# Patient Record
Sex: Female | Born: 1935 | Race: Black or African American | Hispanic: No | Smoking: Never smoker
Health system: Southern US, Community
[De-identification: ages and names within clinical notes are randomized; demographics above are authoritative.]

## PROBLEM LIST (undated history)

## (undated) DIAGNOSIS — M79606 Pain in leg, unspecified: Secondary | ICD-10-CM

## (undated) DIAGNOSIS — F29 Unspecified psychosis not due to a substance or known physiological condition: Secondary | ICD-10-CM

## (undated) DIAGNOSIS — Z789 Other specified health status: Secondary | ICD-10-CM

## (undated) DIAGNOSIS — D696 Thrombocytopenia, unspecified: Secondary | ICD-10-CM

## (undated) DIAGNOSIS — F329 Major depressive disorder, single episode, unspecified: Secondary | ICD-10-CM

## (undated) DIAGNOSIS — D649 Anemia, unspecified: Secondary | ICD-10-CM

## (undated) DIAGNOSIS — G8929 Other chronic pain: Secondary | ICD-10-CM

## (undated) DIAGNOSIS — F0390 Unspecified dementia without behavioral disturbance: Secondary | ICD-10-CM

## (undated) DIAGNOSIS — I10 Essential (primary) hypertension: Secondary | ICD-10-CM

## (undated) DIAGNOSIS — M1990 Unspecified osteoarthritis, unspecified site: Secondary | ICD-10-CM

## (undated) DIAGNOSIS — R1312 Dysphagia, oropharyngeal phase: Secondary | ICD-10-CM

## (undated) DIAGNOSIS — M8580 Other specified disorders of bone density and structure, unspecified site: Secondary | ICD-10-CM

## (undated) DIAGNOSIS — C9000 Multiple myeloma not having achieved remission: Secondary | ICD-10-CM

## (undated) DIAGNOSIS — E7800 Pure hypercholesterolemia, unspecified: Secondary | ICD-10-CM

## (undated) DIAGNOSIS — R778 Other specified abnormalities of plasma proteins: Secondary | ICD-10-CM

## (undated) HISTORY — DX: Other specified disorders of bone density and structure, unspecified site: M85.80

## (undated) HISTORY — PX: TUBAL LIGATION: SHX77

## (undated) HISTORY — PX: FOOT SURGERY: SHX648

## (undated) HISTORY — DX: Multiple myeloma not having achieved remission: C90.00

## (undated) HISTORY — DX: Unspecified osteoarthritis, unspecified site: M19.90

---

## 2000-01-19 ENCOUNTER — Encounter: Admission: RE | Admit: 2000-01-19 | Discharge: 2000-01-19 | Payer: Self-pay | Admitting: Family Medicine

## 2000-03-30 ENCOUNTER — Encounter: Admission: RE | Admit: 2000-03-30 | Discharge: 2000-03-30 | Payer: Self-pay | Admitting: *Deleted

## 2001-02-25 ENCOUNTER — Other Ambulatory Visit: Admission: RE | Admit: 2001-02-25 | Discharge: 2001-02-25 | Payer: Self-pay | Admitting: Family Medicine

## 2001-02-27 ENCOUNTER — Ambulatory Visit (HOSPITAL_COMMUNITY): Admission: RE | Admit: 2001-02-27 | Discharge: 2001-02-27 | Payer: Self-pay | Admitting: Family Medicine

## 2001-02-27 ENCOUNTER — Encounter: Payer: Self-pay | Admitting: Family Medicine

## 2001-03-16 ENCOUNTER — Encounter: Payer: Self-pay | Admitting: *Deleted

## 2001-03-16 ENCOUNTER — Emergency Department (HOSPITAL_COMMUNITY): Admission: EM | Admit: 2001-03-16 | Discharge: 2001-03-16 | Payer: Self-pay | Admitting: *Deleted

## 2001-06-29 ENCOUNTER — Emergency Department (HOSPITAL_COMMUNITY): Admission: EM | Admit: 2001-06-29 | Discharge: 2001-06-29 | Payer: Self-pay | Admitting: Emergency Medicine

## 2001-06-29 ENCOUNTER — Encounter: Payer: Self-pay | Admitting: Emergency Medicine

## 2001-09-01 ENCOUNTER — Encounter: Payer: Self-pay | Admitting: Emergency Medicine

## 2001-09-01 ENCOUNTER — Emergency Department (HOSPITAL_COMMUNITY): Admission: EM | Admit: 2001-09-01 | Discharge: 2001-09-01 | Payer: Self-pay | Admitting: Emergency Medicine

## 2002-05-05 ENCOUNTER — Encounter: Payer: Self-pay | Admitting: Family Medicine

## 2002-05-05 ENCOUNTER — Ambulatory Visit (HOSPITAL_COMMUNITY): Admission: RE | Admit: 2002-05-05 | Discharge: 2002-05-05 | Payer: Self-pay | Admitting: Family Medicine

## 2003-01-22 ENCOUNTER — Emergency Department (HOSPITAL_COMMUNITY): Admission: EM | Admit: 2003-01-22 | Discharge: 2003-01-22 | Payer: Self-pay | Admitting: *Deleted

## 2003-01-22 ENCOUNTER — Encounter: Payer: Self-pay | Admitting: *Deleted

## 2003-01-28 ENCOUNTER — Emergency Department (HOSPITAL_COMMUNITY): Admission: EM | Admit: 2003-01-28 | Discharge: 2003-01-28 | Payer: Self-pay | Admitting: Internal Medicine

## 2003-05-29 ENCOUNTER — Encounter: Payer: Self-pay | Admitting: Emergency Medicine

## 2003-05-29 ENCOUNTER — Emergency Department (HOSPITAL_COMMUNITY): Admission: EM | Admit: 2003-05-29 | Discharge: 2003-05-29 | Payer: Self-pay | Admitting: Emergency Medicine

## 2003-06-03 ENCOUNTER — Ambulatory Visit (HOSPITAL_COMMUNITY): Admission: RE | Admit: 2003-06-03 | Discharge: 2003-06-03 | Payer: Self-pay | Admitting: Family Medicine

## 2003-06-03 ENCOUNTER — Encounter: Payer: Self-pay | Admitting: Family Medicine

## 2003-09-01 ENCOUNTER — Ambulatory Visit (HOSPITAL_COMMUNITY): Admission: RE | Admit: 2003-09-01 | Discharge: 2003-09-01 | Payer: Self-pay | Admitting: Family Medicine

## 2003-09-01 ENCOUNTER — Encounter: Payer: Self-pay | Admitting: Family Medicine

## 2003-10-13 ENCOUNTER — Ambulatory Visit (HOSPITAL_COMMUNITY): Admission: RE | Admit: 2003-10-13 | Discharge: 2003-10-13 | Payer: Self-pay | Admitting: Internal Medicine

## 2004-03-23 ENCOUNTER — Emergency Department (HOSPITAL_COMMUNITY): Admission: EM | Admit: 2004-03-23 | Discharge: 2004-03-23 | Payer: Self-pay | Admitting: *Deleted

## 2005-01-25 ENCOUNTER — Emergency Department (HOSPITAL_COMMUNITY): Admission: EM | Admit: 2005-01-25 | Discharge: 2005-01-25 | Payer: Self-pay | Admitting: Emergency Medicine

## 2005-05-05 ENCOUNTER — Emergency Department (HOSPITAL_COMMUNITY): Admission: EM | Admit: 2005-05-05 | Discharge: 2005-05-05 | Payer: Self-pay | Admitting: Emergency Medicine

## 2005-12-11 ENCOUNTER — Ambulatory Visit (HOSPITAL_COMMUNITY): Admission: RE | Admit: 2005-12-11 | Discharge: 2005-12-11 | Payer: Self-pay | Admitting: Family Medicine

## 2006-04-09 ENCOUNTER — Emergency Department (HOSPITAL_COMMUNITY): Admission: EM | Admit: 2006-04-09 | Discharge: 2006-04-09 | Payer: Self-pay | Admitting: Emergency Medicine

## 2006-04-17 ENCOUNTER — Emergency Department (HOSPITAL_COMMUNITY): Admission: EM | Admit: 2006-04-17 | Discharge: 2006-04-17 | Payer: Self-pay | Admitting: Emergency Medicine

## 2006-04-26 ENCOUNTER — Ambulatory Visit (HOSPITAL_COMMUNITY): Admission: RE | Admit: 2006-04-26 | Discharge: 2006-04-26 | Payer: Self-pay | Admitting: Family Medicine

## 2006-06-12 ENCOUNTER — Ambulatory Visit (HOSPITAL_COMMUNITY): Admission: RE | Admit: 2006-06-12 | Discharge: 2006-06-12 | Payer: Self-pay | Admitting: Family Medicine

## 2006-07-14 ENCOUNTER — Inpatient Hospital Stay (HOSPITAL_COMMUNITY): Admission: EM | Admit: 2006-07-14 | Discharge: 2006-07-16 | Payer: Self-pay | Admitting: Emergency Medicine

## 2006-07-20 ENCOUNTER — Ambulatory Visit (HOSPITAL_COMMUNITY): Admission: RE | Admit: 2006-07-20 | Discharge: 2006-07-20 | Payer: Self-pay | Admitting: Family Medicine

## 2007-02-24 ENCOUNTER — Emergency Department (HOSPITAL_COMMUNITY): Admission: EM | Admit: 2007-02-24 | Discharge: 2007-02-24 | Payer: Self-pay | Admitting: Emergency Medicine

## 2007-03-16 ENCOUNTER — Emergency Department (HOSPITAL_COMMUNITY): Admission: EM | Admit: 2007-03-16 | Discharge: 2007-03-16 | Payer: Self-pay | Admitting: Emergency Medicine

## 2007-04-16 ENCOUNTER — Emergency Department (HOSPITAL_COMMUNITY): Admission: EM | Admit: 2007-04-16 | Discharge: 2007-04-16 | Payer: Self-pay | Admitting: Emergency Medicine

## 2007-06-06 ENCOUNTER — Emergency Department (HOSPITAL_COMMUNITY): Admission: EM | Admit: 2007-06-06 | Discharge: 2007-06-06 | Payer: Self-pay | Admitting: Emergency Medicine

## 2007-07-07 ENCOUNTER — Emergency Department (HOSPITAL_COMMUNITY): Admission: EM | Admit: 2007-07-07 | Discharge: 2007-07-07 | Payer: Self-pay | Admitting: Emergency Medicine

## 2007-07-09 ENCOUNTER — Ambulatory Visit (HOSPITAL_COMMUNITY): Admission: RE | Admit: 2007-07-09 | Discharge: 2007-07-09 | Payer: Self-pay | Admitting: Family Medicine

## 2007-07-25 ENCOUNTER — Emergency Department (HOSPITAL_COMMUNITY): Admission: EM | Admit: 2007-07-25 | Discharge: 2007-07-25 | Payer: Self-pay | Admitting: Emergency Medicine

## 2007-08-07 ENCOUNTER — Ambulatory Visit (HOSPITAL_COMMUNITY): Admission: RE | Admit: 2007-08-07 | Discharge: 2007-08-07 | Payer: Self-pay | Admitting: Pulmonary Disease

## 2007-08-17 ENCOUNTER — Emergency Department (HOSPITAL_COMMUNITY): Admission: EM | Admit: 2007-08-17 | Discharge: 2007-08-17 | Payer: Self-pay | Admitting: Emergency Medicine

## 2008-09-14 ENCOUNTER — Emergency Department (HOSPITAL_COMMUNITY): Admission: EM | Admit: 2008-09-14 | Discharge: 2008-09-14 | Payer: Self-pay | Admitting: Emergency Medicine

## 2008-09-23 ENCOUNTER — Emergency Department (HOSPITAL_COMMUNITY): Admission: EM | Admit: 2008-09-23 | Discharge: 2008-09-23 | Payer: Self-pay | Admitting: Emergency Medicine

## 2008-10-27 ENCOUNTER — Ambulatory Visit (HOSPITAL_COMMUNITY): Admission: RE | Admit: 2008-10-27 | Discharge: 2008-10-27 | Payer: Self-pay | Admitting: Family Medicine

## 2008-11-10 ENCOUNTER — Ambulatory Visit (HOSPITAL_COMMUNITY): Admission: RE | Admit: 2008-11-10 | Discharge: 2008-11-10 | Payer: Self-pay | Admitting: Family Medicine

## 2008-11-13 HISTORY — PX: COLONOSCOPY: SHX174

## 2009-01-13 ENCOUNTER — Telehealth (INDEPENDENT_AMBULATORY_CARE_PROVIDER_SITE_OTHER): Payer: Self-pay | Admitting: *Deleted

## 2009-01-30 ENCOUNTER — Emergency Department (HOSPITAL_COMMUNITY): Admission: EM | Admit: 2009-01-30 | Discharge: 2009-01-30 | Payer: Self-pay | Admitting: Emergency Medicine

## 2009-02-10 DIAGNOSIS — K648 Other hemorrhoids: Secondary | ICD-10-CM | POA: Insufficient documentation

## 2009-02-11 ENCOUNTER — Encounter: Payer: Self-pay | Admitting: Internal Medicine

## 2009-02-11 ENCOUNTER — Ambulatory Visit: Payer: Self-pay | Admitting: Gastroenterology

## 2009-02-11 DIAGNOSIS — Z860100 Personal history of colon polyps, unspecified: Secondary | ICD-10-CM | POA: Insufficient documentation

## 2009-02-11 DIAGNOSIS — S139XXA Sprain of joints and ligaments of unspecified parts of neck, initial encounter: Secondary | ICD-10-CM | POA: Insufficient documentation

## 2009-02-18 ENCOUNTER — Encounter: Payer: Self-pay | Admitting: Internal Medicine

## 2009-02-22 ENCOUNTER — Encounter: Payer: Self-pay | Admitting: Internal Medicine

## 2009-02-22 ENCOUNTER — Ambulatory Visit: Payer: Self-pay | Admitting: Internal Medicine

## 2009-02-22 ENCOUNTER — Ambulatory Visit (HOSPITAL_COMMUNITY): Admission: RE | Admit: 2009-02-22 | Discharge: 2009-02-22 | Payer: Self-pay | Admitting: Internal Medicine

## 2009-02-24 ENCOUNTER — Encounter: Payer: Self-pay | Admitting: Internal Medicine

## 2009-03-14 ENCOUNTER — Emergency Department (HOSPITAL_COMMUNITY): Admission: EM | Admit: 2009-03-14 | Discharge: 2009-03-14 | Payer: Self-pay | Admitting: Emergency Medicine

## 2009-04-10 ENCOUNTER — Emergency Department (HOSPITAL_COMMUNITY): Admission: EM | Admit: 2009-04-10 | Discharge: 2009-04-10 | Payer: Self-pay | Admitting: Emergency Medicine

## 2009-07-26 ENCOUNTER — Emergency Department (HOSPITAL_COMMUNITY): Admission: EM | Admit: 2009-07-26 | Discharge: 2009-07-27 | Payer: Self-pay | Admitting: Emergency Medicine

## 2009-07-30 ENCOUNTER — Ambulatory Visit (HOSPITAL_COMMUNITY): Admission: RE | Admit: 2009-07-30 | Discharge: 2009-07-30 | Payer: Self-pay | Admitting: Family Medicine

## 2009-08-04 ENCOUNTER — Emergency Department (HOSPITAL_COMMUNITY): Admission: EM | Admit: 2009-08-04 | Discharge: 2009-08-05 | Payer: Self-pay | Admitting: Emergency Medicine

## 2009-08-04 ENCOUNTER — Encounter (INDEPENDENT_AMBULATORY_CARE_PROVIDER_SITE_OTHER): Payer: Self-pay | Admitting: *Deleted

## 2009-08-25 ENCOUNTER — Ambulatory Visit: Payer: Self-pay | Admitting: Internal Medicine

## 2009-08-25 DIAGNOSIS — R1319 Other dysphagia: Secondary | ICD-10-CM

## 2009-08-25 DIAGNOSIS — K219 Gastro-esophageal reflux disease without esophagitis: Secondary | ICD-10-CM

## 2009-08-26 ENCOUNTER — Encounter (INDEPENDENT_AMBULATORY_CARE_PROVIDER_SITE_OTHER): Payer: Self-pay | Admitting: *Deleted

## 2009-08-26 ENCOUNTER — Encounter: Payer: Self-pay | Admitting: Internal Medicine

## 2009-09-03 ENCOUNTER — Emergency Department (HOSPITAL_COMMUNITY): Admission: EM | Admit: 2009-09-03 | Discharge: 2009-09-03 | Payer: Self-pay | Admitting: Emergency Medicine

## 2009-09-28 ENCOUNTER — Emergency Department (HOSPITAL_COMMUNITY): Admission: EM | Admit: 2009-09-28 | Discharge: 2009-09-28 | Payer: Self-pay | Admitting: Emergency Medicine

## 2009-11-02 ENCOUNTER — Encounter (HOSPITAL_COMMUNITY): Admission: RE | Admit: 2009-11-02 | Discharge: 2009-11-10 | Payer: Self-pay | Admitting: Family Medicine

## 2009-11-02 ENCOUNTER — Ambulatory Visit (HOSPITAL_COMMUNITY): Admission: RE | Admit: 2009-11-02 | Discharge: 2009-11-02 | Payer: Self-pay | Admitting: Family Medicine

## 2009-11-11 ENCOUNTER — Ambulatory Visit (HOSPITAL_COMMUNITY): Admission: RE | Admit: 2009-11-11 | Discharge: 2009-11-11 | Payer: Self-pay | Admitting: Family Medicine

## 2009-11-17 ENCOUNTER — Encounter (HOSPITAL_COMMUNITY): Admission: RE | Admit: 2009-11-17 | Discharge: 2009-12-17 | Payer: Self-pay | Admitting: Family Medicine

## 2009-12-20 ENCOUNTER — Encounter (HOSPITAL_COMMUNITY): Admission: RE | Admit: 2009-12-20 | Discharge: 2010-01-19 | Payer: Self-pay | Admitting: Family Medicine

## 2010-01-19 ENCOUNTER — Ambulatory Visit (HOSPITAL_COMMUNITY): Admission: RE | Admit: 2010-01-19 | Discharge: 2010-01-19 | Payer: Self-pay | Admitting: Family Medicine

## 2010-02-03 ENCOUNTER — Ambulatory Visit (HOSPITAL_COMMUNITY): Admission: RE | Admit: 2010-02-03 | Discharge: 2010-02-03 | Payer: Self-pay | Admitting: Family Medicine

## 2010-05-17 ENCOUNTER — Emergency Department (HOSPITAL_COMMUNITY): Admission: EM | Admit: 2010-05-17 | Discharge: 2010-05-17 | Payer: Self-pay | Admitting: Emergency Medicine

## 2010-06-19 ENCOUNTER — Emergency Department (HOSPITAL_COMMUNITY): Admission: EM | Admit: 2010-06-19 | Discharge: 2010-06-19 | Payer: Self-pay

## 2010-07-13 ENCOUNTER — Ambulatory Visit (HOSPITAL_COMMUNITY): Payer: Self-pay | Admitting: Psychology

## 2010-07-24 ENCOUNTER — Emergency Department (HOSPITAL_COMMUNITY): Admission: EM | Admit: 2010-07-24 | Discharge: 2010-07-24 | Payer: Self-pay | Admitting: Emergency Medicine

## 2010-07-27 ENCOUNTER — Ambulatory Visit (HOSPITAL_COMMUNITY): Payer: Self-pay | Admitting: Psychology

## 2010-08-03 ENCOUNTER — Ambulatory Visit (HOSPITAL_COMMUNITY): Payer: Self-pay | Admitting: Psychology

## 2010-08-04 ENCOUNTER — Ambulatory Visit (HOSPITAL_COMMUNITY): Payer: Self-pay | Admitting: Psychiatry

## 2010-08-11 ENCOUNTER — Ambulatory Visit (HOSPITAL_COMMUNITY): Payer: Self-pay | Admitting: Psychiatry

## 2010-08-16 ENCOUNTER — Emergency Department (HOSPITAL_COMMUNITY)
Admission: EM | Admit: 2010-08-16 | Discharge: 2010-08-16 | Payer: Self-pay | Source: Home / Self Care | Admitting: Emergency Medicine

## 2010-08-30 ENCOUNTER — Ambulatory Visit (HOSPITAL_COMMUNITY): Payer: Self-pay | Admitting: Psychiatry

## 2010-10-14 ENCOUNTER — Emergency Department (HOSPITAL_COMMUNITY)
Admission: EM | Admit: 2010-10-14 | Discharge: 2010-10-14 | Payer: Self-pay | Source: Home / Self Care | Admitting: Emergency Medicine

## 2010-11-10 ENCOUNTER — Ambulatory Visit (HOSPITAL_COMMUNITY): Payer: Self-pay | Admitting: Psychiatry

## 2010-11-15 ENCOUNTER — Ambulatory Visit (HOSPITAL_COMMUNITY): Admission: RE | Admit: 2010-11-15 | Payer: Self-pay | Source: Home / Self Care | Admitting: Family Medicine

## 2010-12-04 ENCOUNTER — Encounter: Payer: Self-pay | Admitting: Family Medicine

## 2010-12-07 ENCOUNTER — Ambulatory Visit (HOSPITAL_COMMUNITY)
Admission: RE | Admit: 2010-12-07 | Discharge: 2010-12-07 | Payer: Self-pay | Source: Home / Self Care | Attending: Family Medicine | Admitting: Family Medicine

## 2010-12-14 ENCOUNTER — Encounter: Payer: Self-pay | Admitting: Family Medicine

## 2011-01-10 ENCOUNTER — Encounter (INDEPENDENT_AMBULATORY_CARE_PROVIDER_SITE_OTHER): Payer: Medicare Other | Admitting: Psychiatry

## 2011-01-10 DIAGNOSIS — F29 Unspecified psychosis not due to a substance or known physiological condition: Secondary | ICD-10-CM

## 2011-01-24 ENCOUNTER — Encounter (INDEPENDENT_AMBULATORY_CARE_PROVIDER_SITE_OTHER): Payer: Medicare Other | Admitting: Psychiatry

## 2011-01-24 DIAGNOSIS — F29 Unspecified psychosis not due to a substance or known physiological condition: Secondary | ICD-10-CM

## 2011-01-26 LAB — BASIC METABOLIC PANEL
Chloride: 106 mEq/L (ref 96–112)
GFR calc non Af Amer: >60 mL/min (ref >60)
Glucose, Bld: 117 mg/dL — ABNORMAL HIGH (ref 70–99)
Potassium: 3.7 mEq/L (ref 3.5–5.1)
Sodium: 139 mEq/L (ref 135–145)

## 2011-01-26 LAB — URINALYSIS, ROUTINE W REFLEX MICROSCOPIC
Leukocytes, UA: NEGATIVE
Nitrite: NEGATIVE
Protein, ur: NEGATIVE mg/dL
Specific Gravity, Urine: >1.03 — ABNORMAL HIGH (ref 1.005–1.030)
Urobilinogen, UA: 1 mg/dL (ref 0.0–1.0)

## 2011-01-26 LAB — URINE CULTURE

## 2011-01-26 LAB — CBC
HCT: 37.3 % (ref 36.0–46.0)
Hemoglobin: 12.7 g/dL (ref 12.0–15.0)
MCHC: 34.1 g/dL (ref 30.0–36.0)
MCV: 95.8 fL (ref 78.0–100.0)
WBC: 6.7 10*3/uL (ref 4.0–10.5)

## 2011-01-26 LAB — URINE MICROSCOPIC-ADD ON

## 2011-01-26 LAB — DIFFERENTIAL
Basophils Relative: 0 % (ref 0–1)
Lymphocytes Relative: 26 % (ref 12–46)
Monocytes Relative: 7 % (ref 3–12)
Neutro Abs: 4.4 10*3/uL (ref 1.7–7.7)

## 2011-01-26 LAB — RAPID URINE DRUG SCREEN, HOSP PERFORMED
Amphetamines: NOT DETECTED
Barbiturates: NOT DETECTED
Benzodiazepines: NOT DETECTED
Tetrahydrocannabinol: NOT DETECTED

## 2011-01-27 LAB — CBC
HCT: 40.6 % (ref 36.0–46.0)
Hemoglobin: 13.6 g/dL (ref 12.0–15.0)
MCH: 31.9 pg (ref 26.0–34.0)
MCHC: 33.4 g/dL (ref 30.0–36.0)
MCV: 95.6 fL (ref 78.0–100.0)

## 2011-01-27 LAB — URINE MICROSCOPIC-ADD ON

## 2011-01-27 LAB — URINALYSIS, ROUTINE W REFLEX MICROSCOPIC
Nitrite: NEGATIVE
Specific Gravity, Urine: >1.03 — ABNORMAL HIGH (ref 1.005–1.030)
pH: 5.5 (ref 5.0–8.0)

## 2011-01-27 LAB — DIFFERENTIAL
Basophils Relative: 1 % (ref 0–1)
Eosinophils Absolute: 0.1 10*3/uL (ref 0.0–0.7)
Monocytes Absolute: 0.4 10*3/uL (ref 0.1–1.0)
Monocytes Relative: 6 % (ref 3–12)

## 2011-01-27 LAB — TSH: TSH: 0.863 u[IU]/mL (ref 0.350–4.500)

## 2011-01-27 LAB — BASIC METABOLIC PANEL
CO2: 27 mEq/L (ref 19–32)
Glucose, Bld: 106 mg/dL — ABNORMAL HIGH (ref 70–99)
Potassium: 3.1 mEq/L — ABNORMAL LOW (ref 3.5–5.1)
Sodium: 142 mEq/L (ref 135–145)

## 2011-01-27 LAB — URINE CULTURE: Culture  Setup Time: 201108090044

## 2011-02-07 ENCOUNTER — Other Ambulatory Visit (HOSPITAL_COMMUNITY): Payer: Self-pay | Admitting: Family Medicine

## 2011-02-07 ENCOUNTER — Ambulatory Visit (HOSPITAL_COMMUNITY)
Admission: RE | Admit: 2011-02-07 | Discharge: 2011-02-07 | Disposition: A | Discharge status: Home / Self Care | Payer: Medicare Other | Source: Ambulatory Visit | Attending: Family Medicine | Admitting: Family Medicine

## 2011-02-07 DIAGNOSIS — R0602 Shortness of breath: Secondary | ICD-10-CM | POA: Insufficient documentation

## 2011-02-17 LAB — CBC
HCT: 38.2 % (ref 36.0–46.0)
Hemoglobin: 13.5 g/dL (ref 12.0–15.0)
MCHC: 35.3 g/dL (ref 30.0–36.0)
MCV: 94.5 fL (ref 78.0–100.0)
RBC: 4.04 MIL/uL (ref 3.87–5.11)

## 2011-02-17 LAB — BASIC METABOLIC PANEL
CO2: 31 mEq/L (ref 19–32)
Chloride: 106 mEq/L (ref 96–112)
Creatinine, Ser: 0.75 mg/dL (ref 0.4–1.2)
GFR calc Af Amer: >60 mL/min (ref >60)
Potassium: 3.8 mEq/L (ref 3.5–5.1)

## 2011-02-17 LAB — DIFFERENTIAL
Basophils Relative: 1 % (ref 0–1)
Eosinophils Absolute: 0.2 10*3/uL (ref 0.0–0.7)
Eosinophils Relative: 4 % (ref 0–5)
Monocytes Absolute: 0.4 10*3/uL (ref 0.1–1.0)
Monocytes Relative: 8 % (ref 3–12)

## 2011-03-07 ENCOUNTER — Encounter (INDEPENDENT_AMBULATORY_CARE_PROVIDER_SITE_OTHER): Payer: Medicare Other | Admitting: Psychiatry

## 2011-03-07 DIAGNOSIS — F29 Unspecified psychosis not due to a substance or known physiological condition: Secondary | ICD-10-CM

## 2011-03-20 ENCOUNTER — Other Ambulatory Visit (HOSPITAL_COMMUNITY): Payer: Self-pay | Admitting: Family Medicine

## 2011-03-20 DIAGNOSIS — Z139 Encounter for screening, unspecified: Secondary | ICD-10-CM

## 2011-03-23 ENCOUNTER — Ambulatory Visit (HOSPITAL_COMMUNITY)
Admission: RE | Admit: 2011-03-23 | Discharge: 2011-03-23 | Disposition: A | Discharge status: Home / Self Care | Payer: Medicare Other | Source: Ambulatory Visit | Attending: Family Medicine | Admitting: Family Medicine

## 2011-03-23 DIAGNOSIS — Z1231 Encounter for screening mammogram for malignant neoplasm of breast: Secondary | ICD-10-CM | POA: Insufficient documentation

## 2011-03-23 DIAGNOSIS — Z139 Encounter for screening, unspecified: Secondary | ICD-10-CM

## 2011-03-28 NOTE — Op Note (Signed)
NAME:  [name], [name]               ACCOUNT NO.:  [id number]   MEDICAL RECORD NO.:  [id number]          PATIENT TYPE:  AMB   LOCATION:  DAY                           FACILITY:  [facility]   PHYSICIAN:  R. Roetta Sessions, M.D. DATE OF BIRTH:  February 03, 1930   DATE OF PROCEDURE:  02/22/2009  DATE OF DISCHARGE:                               OPERATIVE REPORT   INDICATIONS FOR PROCEDURE:  77 year old African American female with  history of colonic polyps.  She is here for surveillance.  She does not  have any GI symptoms at this time.  Risks, benefits and limitations have  been discussed, questions answered.  Please see documentation in the  medical record.   PROCEDURE NOTE:  O2 saturation, blood pressure, pulse and respiration  monitored the entire procedure.   CONSCIOUS SEDATION:  Versed 3 mg IV, Demerol 75 mg IV in divided doses.   INSTRUMENT USED:  Pentax video chip system.   FINDINGS:  Digital rectal exam revealed no abnormalities.   ENDOSCOPIC FINDINGS:  The prep was adequate.  Colon:  Colonic mucosa was surveyed from the rectosigmoid junction  through the left, transverse, right colon to area of appendiceal  orifice, ileocecal valve and cecum.  These structures were well and seen  photographed for the record.  From this level the scope was slowly and  cautiously withdrawn.  All previously mentioned mucosal surfaces were  again seen.  The patient was noted to have a 5-mm polyp in the base of  the cecum which was cold snared, removed.  There is a second similar  size polyp in the midsigmoid which was cold snared, removed.  Remainder  of colonic mucosa appeared normal.  Scope was pulled down to the rectum  where thorough examination of the rectal mucosa including retroflex view  of the anal verge demonstrated no abnormalities.  The patient tolerated  the procedure well, was reacted in endoscopy.  Cecal withdrawal time 9  minutes.   IMPRESSION:  1. Normal rectum.  2. Sigmoid and cecal  polyp, status post snare resection described      above.  Remainder of the colonic mucosa appeared normal.   RECOMMENDATIONS:  1. Follow-up on path.  2. Further recommendations to follow.      Jonathon Bellows, M.D.  Electronically Signed     RMR/MEDQ  D:  02/22/2009  T:  02/22/2009  Job:  [id number]

## 2011-03-31 NOTE — Op Note (Signed)
NAME:  [name], [name]                      ACCOUNT NO.:  [id number]   MEDICAL RECORD NO.:  [id number]                   PATIENT TYPE:  AMB   LOCATION:  DAY                                  FACILITY:  [facility]   PHYSICIAN:  R. Roetta Sessions, M.D.              DATE OF BIRTH:  04/14/1936   DATE OF PROCEDURE:  10/13/2003  DATE OF DISCHARGE:                                 OPERATIVE REPORT   PROCEDURE:  Surveillance colonoscopy.   INDICATIONS:  The patient is a 77 year old lady who underwent colonoscopy in  2001 and had adenomas removed from her transverse colon.  She is here for  surveillance.  She has done well.  She is not having any bowel symptoms.  Colonoscopy is now being done as a surveillance maneuver.  This procedure  has been discussed with the patient at length previously and again at the  bedside.  The potential risks, benefits and alternatives have been reviewed.  Please see my handwritten H&PULSE.   DESCRIPTION OF PROCEDURE:  Oxygen saturation, blood pressure, pulse and  respiration were monitored throughout the entire procedure.  Conscious  sedation with Versed 3 mg IV and Demerol 750 mg IV in divided doses.  The  instrument was the Olympus video chip adult colonoscope.   FINDINGS:  Digital rectal exam revealed no abnormalities.   ENDOSCOPIC FINDINGS:  Prep:  Adequate.  Rectum:  Examination of the rectal mucosa including retroflexion in the anal  verge revealed only internal hemorrhoids.  Colon:  Colonic mucosa was surveyed from the rectosigmoid junction through  the left, transverse, right colon to the area of the appendiceal orifice,  ileocecal valve and cecum.  From the level of the cecum and the ileocecal  valve, the scope was slowly withdrawn.  All previously mentioned mucosal  surfaces were again seen.  Once again the following abnormalities were  noted.  There was two 3 mm sessile polyps in the ileocecal valve which were  cold biopsied/removed.  There was a third  3 mm polyp in the splenic flexure  which was cold biopsy/ removed.  The remainder of the colonic mucosa  appeared normal.  The patient tolerated the procedure well.   IMPRESSION:  1. Internal hemorrhoids.  Otherwise normal rectum.  2. Diminutive polyps throughout the colon as outlined above.  Cold     biopsied/removed.   RECOMMENDATIONS:  1. Will follow up on pathology.  2. Further recommendations to follow.     ___________________________________________                                            Jonathon Bellows, M.D.   RMR/MEDQ  D:  10/13/2003  T:  10/13/2003  Job:  [id number]

## 2011-03-31 NOTE — Group Therapy Note (Signed)
NAME:  [name], [name]               ACCOUNT NO.:  [id number]   MEDICAL RECORD NO.:  [id number]          PATIENT TYPE:  INP   LOCATION:  A217                          FACILITY:  [facility]   PHYSICIAN:  Angus G. Renard Matter, MD   DATE OF BIRTH:  [birth year]   DATE OF PROCEDURE:  DATE OF DISCHARGE:                                   PROGRESS NOTE   This patient was admitted with weakness in upper extremities, occasional  _episodes of chest pain_________ .  Emergency room felt and was concerned  that she might be having anginal equivalent and she was admitted to the  hospital to rule out ischemic heart disease and for further evaluation.   OBJECTIVE:  VITAL SIGNS:  Blood pressure 149/83, respiratory rate 18, pulse  81, temperature 97.  Blood sugars ranged from 113 to 138.  Her chemistries  show low potassium 3.3.  LUNGS:  Clear to auscultation and percussion.  HEART:  Regular rhythm.  ABDOMEN:  No palpable organ or masses.   ASSESSMENT:  The patient does have hypokalemia for some reason. Admitted  with weakness of upper extremities and to rule out ischemic heart disease.  Plan to continue to monitor her cardiac enzymes.  Will obtain cervical  spine, repeat potassium and will replenish potassium as needed.      Angus G. Renard Matter, MD  Electronically Signed     AGM/MEDQ  D:  07/15/2006  T:  07/16/2006  Job:  [id number]

## 2011-03-31 NOTE — H&P (Signed)
NAME:  [name], MATT NO.:  [id number]   MEDICAL RECORD NO.:  [id number]          PATIENT TYPE:  INP   LOCATION:  A217                          FACILITY:  [facility]   PHYSICIAN:  Hanley Hays. Dechurch, M.D.DATE OF BIRTH:  [birth year]   DATE OF ADMISSION:  07/14/2006  DATE OF DISCHARGE:  LH                                HISTORY & PHYSICAL   HISTORY OF PRESENT ILLNESS:  Ms. Havlik is a 77 year old African American  female on no medications except for vitamins who presented to the  emergency room complaining of bilateral arm pain.  Actually, she has  episodes where her arms give out.  She states she could be washing dishes  and the just feel limp.  There does not seem to be any exertional component  but when she uses her upper extremities as in combing her hair or styling  her hair, she has the same symptomatology.  Occasionally, she will notice  some paresthesias.  The emergency room physician was concerned she might be  having an anginal equivalent.  The patient is a very difficult historian to  say the least.   PAST MEDICAL HISTORY:  Her past medical history remarkable for tubal  ligation.  She states she has no other chronic medical problems.   ALLERGIES:  She denies any allergies.   MEDICATIONS:  She is taking vitamins but cannot name all the vitamins she is  taking.  One of them is a multivitamin.  She denies any herbal products.  She has history of alcohol or tobacco abuse.  She is widowed x5 years.  She  has eight children, all alive and well.   FAMILY HISTORY:  Family medical history is pertinent for prostate cancer,  diabetes mellitus.  Mother died at age 2 with complications thereof.  Father died at age 21 with cirrhosis.   REVIEW OF SYSTEMS:  No GI complaints.  No GU complaints.  Notes some  shortness of breath which she has noted as of late with exertion, though she  cannot be specific.  Denies any fever, chills.  She states she might have  lost about  10 pounds.  Her appetite has been good.  She denies any sleep  complaints.  No headache, no visual changes, no jaw claudication muscle  aches, myalgias, etc.   PHYSICAL EXAMINATION:  GENERAL:  Physical exam reveals well-developed, well-  nourished slightly overweight female with centripetal obesity.  VITAL SIGNS:  Blood pressure is 160/96, temperature of 97.4, pulse 90.  O2  saturation on room air is 100%.  NECK:  Supple.  No JVD, adenopathy or thyromegaly.  HEENT:  Pupils equal, round and reactive to light.  She has some tearing of  the right eye but no conjunctivitis.  This is a chronic issue.  LUNGS:  Clear to auscultation.  HEART:  Regular rate and rhythm without murmur, gallop or rub.  Abdomen: Obese, soft and nontender.  Extremities: No clubbing, cyanosis or edema.  She moves all extremities x4.  I am unable to reproduce any paresthesias or drop attacks while here in the  emergency room.  ASSESSMENT AND PLAN:  1. Bilateral upper extremity pain with the concern that this may been      anginal equivalent although I cannot get a consistent history from the      patient.  She is noted to be hypertensive and not treated and she has      been told she may have a touch of sugar although apparently fasting      sugars have been okay.  Her sugar today is 168.  She is going to be      admitted to the hospital to telemetry unit to rule out over this point.      If her enzymes are negative, she will be discharged home to follow up      as an outpatient for a stress test.  Will check a sedimentation rate to      be sure there is no evidence of PMR although she has no other      supporting symptoms.  Though as she does note some leg weakness she may      have some limb-girdle weakness that could be consistent.  2. The other issues will be a hemoglobin A1c to rule out diabetes mellitus      once and for all.  3. Monitor her blood pressure.  She may benefit from some intervention.      Plans  have been discussed with the patient.  She seems to have      understanding.      Hanley Hays Josefine Class, M.D.  Electronically Signed     FED/MEDQ  D:  07/14/2006  T:  07/14/2006  Job:  [id number]

## 2011-03-31 NOTE — Discharge Summary (Signed)
NAME:  [name], [name]               ACCOUNT NO.:  [id number]   MEDICAL RECORD NO.:  [id number]          PATIENT TYPE:  INP   LOCATION:  A217                          FACILITY:  [facility]   PHYSICIAN:  Angus G. Renard Matter, MD   DATE OF BIRTH:  1930/01/01   DATE OF ADMISSION:  07/14/2006  DATE OF DISCHARGE:  09/03/2007LH                                 DISCHARGE SUMMARY   DIAGNOSES:  1. Hypokalemia.  2. Weakness in the upper extremities.  3. Probable nerve root compression C-spine.  4. Hyperglycemia.   CONDITION ON DISCHARGE:  Her condition is stable and improved at the time of  discharge.   This patient came to the emergency room complaining of bilateral arm pain.  She stated she had had episodes of weakness in her arms.  The emergency room  physician was concerned that she might be having an anginal equivalent.  She  was, therefore, admitted to rule out this problem.   PHYSICAL EXAMINATION:  GENERAL:  Well-developed, well-nourished female.  VITAL SIGNS:  Blood pressure 160/96, pulse 90, temperature 97.4.  NECK:  Supple.  No JVD or thyromegaly.  HEENT:  Pupils round, equal and reactive to light.  LUNGS:  Clear to percussion and auscultation.  HEART:  Regular rhythm.  ABDOMEN:  No palpable organs or masses.   LABORATORY DATA:  Admission CBC revealed WBC of 5300 with hemoglobin of  12.9, hematocrit 36.3.  Chemistry revealed a sodium of 138, potassium 3.3,  chloride 104, CO2 of 25, glucose 168, BUN 10, creatinine 0.9, calcium 8.9.  Subsequent chemistries - on July 16, 2006, sodium 137, potassium 3.8,  chloride 99, CO2 of 27, glucose 166.  BUN 12, creatinine 0.9.  Liver enzymes  revealed SGOT of 19, SGPT 14, alkaline phosphatase 82, bilirubin 0.3.  Hemoglobin A1C of 5.6.  CK 74, CK-MB 2.3, troponin 0.02.  Subsequent CK 57,  CK-MB 1.8, troponin 0.02.  Lipid profile revealed a cholesterol of 175,  triglycerides 76, HDL 46, LDL 114.  TSH 1.497.  X-rays of the chest revealed  no definite  subcarinal density, bibasilar atelectasis, slightly prominent  heart size.  No pulmonary edema.  C-spine revealed degenerative changes.  Electrocardiogram revealed normal sinus rhythm, normal EKG.   HOSPITAL COURSE:  The patient, at the time of her admission, was placed on  IV fluids, normal saline.  She was given KCl 40 mEq by mouth.  Magnesium  level, liver profile, lipid profile were ordered.  Accu-Cheks a.c. and h.s.  were ordered.  The patient improved.  She was continued on K-Dur 20 mEq  daily.  Cardiac monitors were normal.  The electrocardiogram revealed normal  sinus rhythm.  The patient has not complained more of weakness or pain in  her extremities.  An x-ray of her cervical spine revealed diffuse  degenerative changes, mild foraminal encroachment  of the left C3-4, C4-5.  MRI was recommended as an outpatient.  The patient  continued to improve and was able to be discharged after 2 days  hospitalization.  A prescription for K-Dur 20 mEq was continued.  The  patient was advised to  continue previously-prescribed medications and return  to her primary care physician for followup.      Angus G. Renard Matter, MD  Electronically Signed     AGM/MEDQ  D:  07/26/2006  T:  07/26/2006  Job:  [id number]

## 2011-05-02 ENCOUNTER — Encounter (INDEPENDENT_AMBULATORY_CARE_PROVIDER_SITE_OTHER): Payer: Medicare Other | Admitting: Psychiatry

## 2011-05-02 DIAGNOSIS — F29 Unspecified psychosis not due to a substance or known physiological condition: Secondary | ICD-10-CM

## 2011-05-18 ENCOUNTER — Ambulatory Visit (INDEPENDENT_AMBULATORY_CARE_PROVIDER_SITE_OTHER): Payer: Medicare Other | Admitting: Psychiatry

## 2011-05-18 DIAGNOSIS — F29 Unspecified psychosis not due to a substance or known physiological condition: Secondary | ICD-10-CM

## 2011-05-31 ENCOUNTER — Encounter (HOSPITAL_COMMUNITY): Payer: Medicare Other | Admitting: Psychiatry

## 2011-06-06 ENCOUNTER — Encounter (HOSPITAL_COMMUNITY): Payer: Medicare Other | Admitting: Psychiatry

## 2011-06-16 ENCOUNTER — Encounter (HOSPITAL_COMMUNITY): Payer: Medicare Other | Admitting: Psychiatry

## 2011-06-22 ENCOUNTER — Encounter (INDEPENDENT_AMBULATORY_CARE_PROVIDER_SITE_OTHER): Payer: Medicare Other | Admitting: Psychiatry

## 2011-06-22 DIAGNOSIS — F29 Unspecified psychosis not due to a substance or known physiological condition: Secondary | ICD-10-CM

## 2011-06-27 ENCOUNTER — Encounter (INDEPENDENT_AMBULATORY_CARE_PROVIDER_SITE_OTHER): Payer: Medicare Other | Admitting: Psychiatry

## 2011-06-27 DIAGNOSIS — F29 Unspecified psychosis not due to a substance or known physiological condition: Secondary | ICD-10-CM

## 2011-08-15 LAB — COMPREHENSIVE METABOLIC PANEL
ALT: 17
AST: 20
Calcium: 9.1
Creatinine, Ser: 0.65
GFR calc Af Amer: >60
Sodium: 139
Total Protein: 6.6

## 2011-08-15 LAB — DIFFERENTIAL
Basophils Absolute: 0
Basophils Relative: 1
Monocytes Absolute: 0.4
Neutro Abs: 3.4
Neutrophils Relative %: 58

## 2011-08-15 LAB — CBC
MCHC: 34.5
MCV: 93.9
RDW: 13.2

## 2011-08-15 LAB — D-DIMER, QUANTITATIVE: D-Dimer, Quant: 0.47

## 2011-08-25 LAB — I-STAT 8, (EC8 V) (CONVERTED LAB)
Acid-Base Excess: 5 — ABNORMAL HIGH
Bicarbonate: 29.6 — ABNORMAL HIGH
Chloride: 103
HCT: 42
Operator id: 211291
pCO2, Ven: 40.5 — ABNORMAL LOW

## 2011-08-25 LAB — STREP A DNA PROBE: Group A Strep Probe: NEGATIVE

## 2011-08-25 LAB — RAPID URINE DRUG SCREEN, HOSP PERFORMED
Amphetamines: NOT DETECTED
Barbiturates: NOT DETECTED
Opiates: NOT DETECTED
Opiates: NOT DETECTED
Tetrahydrocannabinol: NOT DETECTED

## 2011-08-25 LAB — ETHANOL: Alcohol, Ethyl (B): <5

## 2011-08-28 LAB — URINALYSIS, ROUTINE W REFLEX MICROSCOPIC
Bilirubin Urine: NEGATIVE
Glucose, UA: NEGATIVE
Specific Gravity, Urine: 1.02
pH: 6

## 2011-08-28 LAB — URINE MICROSCOPIC-ADD ON

## 2011-08-28 LAB — CBC
Hemoglobin: 12.9
RBC: 4.01
RDW: 13.6

## 2011-08-28 LAB — DIFFERENTIAL
Basophils Relative: 1
Eosinophils Absolute: 0.2
Eosinophils Relative: 3
Monocytes Relative: 5
Neutrophils Relative %: 61

## 2011-08-28 LAB — COMPREHENSIVE METABOLIC PANEL
ALT: 12
AST: 15
Alkaline Phosphatase: 81
CO2: 30
Glucose, Bld: 92
Potassium: 3.6
Sodium: 139
Total Protein: 6.6

## 2011-08-28 LAB — POCT CARDIAC MARKERS: Operator id: 286941

## 2011-08-31 ENCOUNTER — Encounter (HOSPITAL_COMMUNITY): Payer: Medicare Other | Admitting: Psychiatry

## 2011-09-07 ENCOUNTER — Encounter (HOSPITAL_COMMUNITY): Payer: Medicare Other | Admitting: Psychiatry

## 2011-10-17 ENCOUNTER — Encounter (HOSPITAL_COMMUNITY): Payer: Self-pay | Admitting: Psychiatry

## 2011-10-17 ENCOUNTER — Ambulatory Visit (INDEPENDENT_AMBULATORY_CARE_PROVIDER_SITE_OTHER): Payer: Medicare Other | Admitting: Psychiatry

## 2011-10-17 VITALS — Wt 184.0 lb

## 2011-10-17 DIAGNOSIS — F29 Unspecified psychosis not due to a substance or known physiological condition: Secondary | ICD-10-CM

## 2011-10-17 MED ORDER — HALOPERIDOL 2 MG PO TABS: 2.0000 mg | ORAL_TABLET | Freq: Every day | ORAL | Status: DC

## 2011-10-17 NOTE — Progress Notes (Signed)
[facility] [id number] Progress Note  [name] [id number] 77 y.o.  10/17/2011 10:38 AM  Chief Complaint: I am not sleeping very well, I believe there is someone in my home  History of Present Illness: The patient seen today for her followup appointment. She came with her daughter to endorse patient has been not doing very well in past few weeks. She has been noncompliant with her Haldol. Patient complained of increased restlessness paranoid thinking agitation anger and believe somebody is in her home. Last week patient hold the knife at night believe that somebody will break in her home. Pt son called police and pt was calm down. Daughter told patient has been not taking the medication as prescribed and she usually hide her medication and very resistant for compliance. Recently she has been not sleeping very well talking to herself or paranoid thinking. She believes that there are few people outside who or going to break in if she sleep all night. Though she admitted when she takes medication she sleeps good but she does not want to be sleep all night. Recently daughter has power of attorney and and more involved in patient care. Patient denies any agitation anger or mood swings but remains very delusional and psychotic. Though she is pleasant and cooperative during the conversation but remains paranoid about neighbors. She denies any active or passive suicidal thinking or homicidal thinking but admitted being scared and does not want to leave her home. Patient reported no side effects of medication and actually like to Haldol which helps sleep. Recently she has seen her primary care physician who started on antianxiety medication but daughter and the patient do not remember the name of medication. Patient daughter is now very serious if these episodes continues to happen than she is considering her mother for assisted living or adult home. The patient lives by herself and only allow her  daughter and son to visit her.  Suicidal Ideation: No Plan Formed: No Patient has means to carry out plan: No  Homicidal Ideation: No Plan Formed: No Patient has means to carry out plan: No  Review of Systems: Psychiatric: Agitation: No Hallucination: No Depressed Mood: No Insomnia: Yes Hypersomnia: No Altered Concentration: No Feels Worthless: No Grandiose Ideas: No Belief In Special Powers: Yes New/Increased Substance Abuse: No Compulsions: No  Neurologic: Headache: No Seizure: No Paresthesias: No  Past Medical Family, Social History: Patient has history of chronic back pain she takes tramadol from her primary care physician.  Outpatient Encounter Prescriptions as of 10/17/2011  Medication Sig Dispense Refill  . haloperidol (HALDOL) 2 MG tablet Take 1 tablet (2 mg total) by mouth at bedtime.  30 tablet  1    Past Psychiatric History/Hospitalization(s): Patient denies any history of past psychiatric illness or inpatient treatment Anxiety: Yes Bipolar Disorder: No Depression: No Mania: No Psychosis: Yes Schizophrenia: No Personality Disorder: No Hospitalization for psychiatric illness: No History of Electroconvulsive Shock Therapy: No Prior Suicide Attempts: No  Physical Exam: Constitutional:  Wt 184 lb (83.462 kg)  General Appearance: well nourished  Musculoskeletal: Strength & Muscle Tone: within normal limits Gait & Station: normal Patient leans: N/A  Psychiatric: Speech (describe rate, volume, coherence, spontaneity, and abnormalities if any): Her speech is soft slow and coherent  Thought Process (describe rate, content, abstract reasoning, and computation): Her thought process is loose and at times circumstantial  Associations: Loose  Thoughts: delusions, paranoid thinking about neighbors that they may harm her  Mental Status: Orientation: oriented to person,  place and time/date Mood & Affect: anxiety Attention Span & Concentration:  Poor  Medical Decision Making (Choose Three): New problem, with additional work up planned, Review of Psycho-Social Stressors (1), Established Problem, Worsening (2), Review or order medicine tests (1) and Review of Medication Regimen & Side Effects (2)  Assessment: Axis I: Psychosis NOS  Axis II: Deferred  Axis III: Chronic back pain  Axis IV: Moderate  Axis V: 45-55   Plan: I talked to the patient and her daughter in length we talk about medication compliance and safety issue. Patient understand that she need to take the medication otherwise she will get more paranoid. Daughter also plan to visit her more frequently so that patient can take the medication regularly. I also talked about the risk of relapse if patient does not take the medication on time. We also talked about in case of crisis then she need to call 911 or take patient to the ER for evaluation. We also talked about commitment to psychiatric hospital if she possess danger to self or others. I explained the process of commitment in detail. I will see her again in 6 weeks I recommended to call if she has any question or concern about the medication. Time spent 30 minutes  ARFEEN,SYED T., MD 10/17/2011

## 2011-10-21 ENCOUNTER — Other Ambulatory Visit (HOSPITAL_COMMUNITY): Payer: Self-pay

## 2011-11-30 ENCOUNTER — Ambulatory Visit (INDEPENDENT_AMBULATORY_CARE_PROVIDER_SITE_OTHER): Payer: Medicare Other | Admitting: Psychiatry

## 2011-11-30 ENCOUNTER — Encounter (HOSPITAL_COMMUNITY): Payer: Self-pay | Admitting: Psychiatry

## 2011-11-30 DIAGNOSIS — F259 Schizoaffective disorder, unspecified: Secondary | ICD-10-CM

## 2011-11-30 DIAGNOSIS — F29 Unspecified psychosis not due to a substance or known physiological condition: Secondary | ICD-10-CM

## 2011-11-30 MED ORDER — HALOPERIDOL 2 MG PO TABS: 2.0000 mg | ORAL_TABLET | Freq: Every day | ORAL | Status: DC

## 2011-11-30 NOTE — Progress Notes (Signed)
  [facility] [address] Progress Note  [name] [id number] 78 y.o.  11/30/2011 1:17 PM  Chief Complaint: I am doing much better  History of Present Illness: The patient seen today for her followup appointment. She came with her daughter to endorse patient has been more compliant with her medication and doing very well. She denies any agitation anger or mood swings. Her paranoid thinking is less intense and less frequent. She does not call in the middle of the night to her daughter.. Patient admitted since she is taking medication regularly she feels much calmer and relaxed. Her medicine is not manage by her daughter. There are no tremors shakes or extrapyramidal side effects noted. There has been no anger or agitation. She had a good Christmas and holidays.   Suicidal Ideation: No Plan Formed: No Patient has means to carry out plan: No  Homicidal Ideation: No Plan Formed: No Patient has means to carry out plan: No  Review of Systems: Psychiatric: Agitation: No Hallucination: No Depressed Mood: No Insomnia: Yes Hypersomnia: No Altered Concentration: No Feels Worthless: No Grandiose Ideas: No Belief In Special Powers: Yes New/Increased Substance Abuse: No Compulsions: No  Neurologic: Headache: No Seizure: No Paresthesias: No  Past Medical Family, Social History: Patient has history of chronic back pain she takes tramadol from her primary care physician.  Outpatient Encounter Prescriptions as of 11/30/2011  Medication Sig Dispense Refill  . haloperidol (HALDOL) 2 MG tablet Take 1 tablet (2 mg total) by mouth at bedtime.  30 tablet  0  . traMADol (ULTRAM) 50 MG tablet Take 50 mg by mouth daily as needed.      Marland Kitchen DISCONTD: haloperidol (HALDOL) 2 MG tablet Take 2 mg by mouth 2 (two) times daily.        Past Psychiatric History/Hospitalization(s): Patient denies any history of past psychiatric illness or inpatient treatment Anxiety: Yes Bipolar Disorder:  No Depression: No Mania: No Psychosis: Yes Schizophrenia: No Personality Disorder: No Hospitalization for psychiatric illness: No History of Electroconvulsive Shock Therapy: No Prior Suicide Attempts: No   General Appearance: well nourished  Musculoskeletal: Strength & Muscle Tone: within normal limits Gait & Station: normal Patient leans: N/A  Mental status examination Patient is casually dressed and fairly groomed. She maintained fair eye contact and she is more relevant in conversation. Her speech is soft clear but has poverty of thought content. She denies any paranoid thinking. He denies any active or passive suicidal thoughts or homicidal thoughts. She denies any auditory or visual hallucination. There were no EPS or tremors present. She's alert and oriented x3. Her attention and concentration is fair. She's cooperative in conversation. Her insight and judgment is fair her impulse control is okay   Assessment: Axis I: Psychosis NOS  Axis II: Deferred  Axis III: Chronic back pain  Axis IV: Moderate  Axis V: 45-55   Plan: I do believe that patient is doing better since she is taking Haldol regularly. Her daughter also endorsed improvement with the compliance of the medication. At this time patient reported no side effects of medication and like to continue current dose. I have explained risks and benefits of medication and recommended to call us if she has any question or concern about the medication or if he feel worsening of her symptoms. I will see her again in 3 months  Time spent 30 minutes  Lexander Tremblay T., MD 11/30/2011

## 2012-01-27 ENCOUNTER — Emergency Department (HOSPITAL_COMMUNITY)
Admission: EM | Admit: 2012-01-27 | Discharge: 2012-01-29 | Disposition: A | Discharge status: Other Acute Inpatient Hospital | Payer: Medicare Other | Attending: Emergency Medicine | Admitting: Emergency Medicine

## 2012-01-27 ENCOUNTER — Encounter (HOSPITAL_COMMUNITY): Payer: Self-pay | Admitting: *Deleted

## 2012-01-27 ENCOUNTER — Emergency Department (HOSPITAL_COMMUNITY): Payer: Medicare Other

## 2012-01-27 ENCOUNTER — Other Ambulatory Visit: Payer: Self-pay

## 2012-01-27 DIAGNOSIS — F29 Unspecified psychosis not due to a substance or known physiological condition: Secondary | ICD-10-CM

## 2012-01-27 DIAGNOSIS — R4182 Altered mental status, unspecified: Secondary | ICD-10-CM | POA: Insufficient documentation

## 2012-01-27 DIAGNOSIS — I517 Cardiomegaly: Secondary | ICD-10-CM | POA: Insufficient documentation

## 2012-01-27 DIAGNOSIS — Z79899 Other long term (current) drug therapy: Secondary | ICD-10-CM | POA: Insufficient documentation

## 2012-01-27 DIAGNOSIS — J438 Other emphysema: Secondary | ICD-10-CM | POA: Insufficient documentation

## 2012-01-27 DIAGNOSIS — N390 Urinary tract infection, site not specified: Secondary | ICD-10-CM

## 2012-01-27 HISTORY — DX: Pain in leg, unspecified: M79.606

## 2012-01-27 HISTORY — DX: Other specified health status: Z78.9

## 2012-01-27 HISTORY — DX: Other chronic pain: G89.29

## 2012-01-27 HISTORY — DX: Unspecified psychosis not due to a substance or known physiological condition: F29

## 2012-01-27 LAB — BASIC METABOLIC PANEL
BUN: 10 mg/dL (ref 6–23)
CO2: 28 mEq/L (ref 19–32)
Chloride: 101 mEq/L (ref 96–112)
Creatinine, Ser: 0.65 mg/dL (ref 0.50–1.10)
Glucose, Bld: 131 mg/dL — ABNORMAL HIGH (ref 70–99)
Potassium: 3.3 mEq/L — ABNORMAL LOW (ref 3.5–5.1)

## 2012-01-27 LAB — CBC
HCT: 36 % (ref 36.0–46.0)
Hemoglobin: 12.4 g/dL (ref 12.0–15.0)
MCV: 92.1 fL (ref 78.0–100.0)
RBC: 3.91 MIL/uL (ref 3.87–5.11)
RDW: 12.7 % (ref 11.5–15.5)
WBC: 5.2 10*3/uL (ref 4.0–10.5)

## 2012-01-27 LAB — URINALYSIS, ROUTINE W REFLEX MICROSCOPIC
Bilirubin Urine: NEGATIVE
Glucose, UA: NEGATIVE mg/dL
Ketones, ur: NEGATIVE mg/dL
Specific Gravity, Urine: 1.025 (ref 1.005–1.030)
pH: 6 (ref 5.0–8.0)

## 2012-01-27 LAB — ETHANOL: Alcohol, Ethyl (B): <11 mg/dL (ref 0–11)

## 2012-01-27 LAB — URINE MICROSCOPIC-ADD ON

## 2012-01-27 LAB — RAPID URINE DRUG SCREEN, HOSP PERFORMED
Amphetamines: NOT DETECTED
Barbiturates: NOT DETECTED
Benzodiazepines: NOT DETECTED
Cocaine: NOT DETECTED

## 2012-01-27 MED ORDER — ONDANSETRON HCL 4 MG PO TABS: 4.0000 mg | ORAL_TABLET | Freq: Three times a day (TID) | ORAL | Status: DC | PRN

## 2012-01-27 MED ORDER — ALUM & MAG HYDROXIDE-SIMETH 200-200-20 MG/5ML PO SUSP: 30.0000 mL | ORAL | Status: DC | PRN

## 2012-01-27 MED ORDER — CEPHALEXIN 500 MG PO CAPS
500.0000 mg | ORAL_CAPSULE | Freq: Once | ORAL | Status: AC
  Administered 2012-01-27: 500 mg via ORAL
  Filled 2012-01-27: qty 1

## 2012-01-27 MED ORDER — ACETAMINOPHEN 325 MG PO TABS: 650.0000 mg | ORAL_TABLET | ORAL | Status: DC | PRN

## 2012-01-27 MED ORDER — CEPHALEXIN 500 MG PO CAPS
500.0000 mg | ORAL_CAPSULE | Freq: Four times a day (QID) | ORAL | Status: DC
  Administered 2012-01-28 – 2012-01-29 (×6): 500 mg via ORAL
  Filled 2012-01-27 (×6): qty 1

## 2012-01-27 MED ORDER — POTASSIUM CHLORIDE 20 MEQ/15ML (10%) PO LIQD
40.0000 meq | Freq: Once | ORAL | Status: AC
  Administered 2012-01-27: 40 meq via ORAL
  Filled 2012-01-27: qty 30

## 2012-01-27 MED ORDER — HALOPERIDOL 2 MG PO TABS
2.0000 mg | ORAL_TABLET | Freq: Once | ORAL | Status: AC
  Administered 2012-01-27: 2 mg via ORAL
  Filled 2012-01-27: qty 1

## 2012-01-27 MED ORDER — HALOPERIDOL 2 MG PO TABS
2.0000 mg | ORAL_TABLET | Freq: Every day | ORAL | Status: DC
  Administered 2012-01-28 (×2): 2 mg via ORAL
  Filled 2012-01-27 (×3): qty 1

## 2012-01-27 MED ORDER — HALOPERIDOL 2 MG PO TABS
ORAL_TABLET | ORAL | Status: AC
  Filled 2012-01-27: qty 1

## 2012-01-27 MED ORDER — IBUPROFEN 400 MG PO TABS: 400.0000 mg | ORAL_TABLET | Freq: Three times a day (TID) | ORAL | Status: DC | PRN

## 2012-01-27 MED ORDER — LORAZEPAM 1 MG PO TABS: 1.0000 mg | ORAL_TABLET | Freq: Three times a day (TID) | ORAL | Status: DC | PRN

## 2012-01-27 MED ORDER — ZOLPIDEM TARTRATE 5 MG PO TABS: 5.0000 mg | ORAL_TABLET | Freq: Every evening | ORAL | Status: DC | PRN

## 2012-01-27 NOTE — ED Notes (Signed)
ekg and chest xray faxed to [facility]
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
## 2012-01-27 NOTE — ED Notes (Signed)
Pt given a meal tray 

## 2012-01-27 NOTE — ED Notes (Signed)
Family states patient has been disoriented and hallucinating. Being treated by Dr. Lolly Mustache. MD told family to bring to ED if Haloperidol did not work.
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
## 2012-01-28 MED ORDER — HALOPERIDOL 5 MG PO TABS
ORAL_TABLET | ORAL | Status: AC
  Filled 2012-01-28: qty 1
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
## 2012-01-28 NOTE — ED Notes (Signed)
Pt sitting up in bed talking to family members. NAD.

## 2012-01-28 NOTE — ED Notes (Signed)
Greg from [facility] team advises pt is not suicidal and will not need a sitter

## 2012-01-28 NOTE — Progress Notes (Signed)
[id number] Patient has been cooperative.Continues to be psychotic. She is awaiting placement at [facility].

## 2012-01-28 NOTE — ED Notes (Signed)
Debbie from [place] [facility] called, pt has been accepted and can be transferred after 7am tomorrow. Call report at [phone], also need the voluntary commitment paper sent. Eunice Blase is faxing a form for the POA to sign and send with the pt. Accepting MD is B.Lowry.

## 2012-01-28 NOTE — ED Provider Notes (Addendum)
Pt stable and awaiting placement  Janet Lennert, MD 01/28/12 1545  She has been accepted at [facility] still. Accepting physician is Salley Hews M.D.  Dione Booze, MD 01/29/12 [phone]

## 2012-01-28 NOTE — ED Notes (Signed)
Family currently visiting with pt. NAD.

## 2012-01-28 NOTE — ED Notes (Signed)
Pt resting quietly, watching television.  

## 2012-01-28 NOTE — ED Notes (Signed)
Tammy Sours, ACT phoned to notify [name] has received all documents and she is pending acceptance. Daughter aware and states that if she is accepted she will return to facility to sign POA paperwork. No beds are expected to be available until tomorrow. Daughter aware of this also.

## 2012-01-28 NOTE — ED Notes (Signed)
Pt awake and alert. Pt eating breakfast. Phone provided to pt. Pt remains if full view of nurses station and has stayed in room. Pt has remained pleasant and cooperative.

## 2012-01-29 LAB — BASIC METABOLIC PANEL
BUN: 11 mg/dL (ref 6–23)
CO2: 28 mEq/L (ref 19–32)
Calcium: 9.4 mg/dL (ref 8.4–10.5)
Creatinine, Ser: 0.63 mg/dL (ref 0.50–1.10)
GFR calc non Af Amer: 83 mL/min — ABNORMAL LOW (ref >90)
Glucose, Bld: 107 mg/dL — ABNORMAL HIGH (ref 70–99)

## 2012-01-29 LAB — URINE CULTURE
Colony Count: NO GROWTH
Culture  Setup Time: 201303171959

## 2012-01-29 MED ORDER — POTASSIUM CHLORIDE CRYS ER 20 MEQ PO TBCR
60.0000 meq | EXTENDED_RELEASE_TABLET | Freq: Once | ORAL | Status: AC
  Administered 2012-01-29: 60 meq via ORAL
  Filled 2012-01-29: qty 3

## 2012-01-29 NOTE — ED Notes (Addendum)
Awake sitting on side of bed -- wants to call her daughter.  Advised we would call her closer to 7:00am - warm blanket given and tucked in - resting

## 2012-01-29 NOTE — ED Notes (Signed)
[facility] called for transport to [facility].  Will send a  Truck when available.

## 2012-01-29 NOTE — ED Notes (Addendum)
Spoke to [place] at [facility]- states waiting for copy of voluntary consent. Will fax form to ED for pt's POA to sign. POA Rose Phi (daughter)- [phone]

## 2012-01-29 NOTE — Progress Notes (Signed)
Faxed legal paperwork to [facility]. Patient has been accepted to [facility] by Dr Lowanda Foster. Support paperwork completed. Patient to be transported by [facility]. Dr. Preston Fleeting is in agreement with this disposition.

## 2012-01-29 NOTE — ED Notes (Signed)
Spoke to pt's daughter Rose Phi- she states she will be up to hospital in the next hour or so and can sign for consent to send pt to [facility].

## 2012-01-29 NOTE — ED Notes (Signed)
Pt transferred to [facility]  Per transfer orders. Family at bedside. Pt's belongings transferred with pt.

## 2012-01-30 ENCOUNTER — Ambulatory Visit (HOSPITAL_COMMUNITY): Payer: Medicare Other | Admitting: Psychiatry

## 2012-02-25 ENCOUNTER — Inpatient Hospital Stay (HOSPITAL_COMMUNITY)
Admission: EM | Admit: 2012-02-25 | Discharge: 2012-03-01 | DRG: 081 | Disposition: A | Discharge status: Home Health | Payer: Medicare Other | Attending: Family Medicine | Admitting: Family Medicine

## 2012-02-25 ENCOUNTER — Encounter (HOSPITAL_COMMUNITY): Payer: Self-pay | Admitting: Emergency Medicine

## 2012-02-25 ENCOUNTER — Emergency Department (HOSPITAL_COMMUNITY): Payer: Medicare Other

## 2012-02-25 DIAGNOSIS — F259 Schizoaffective disorder, unspecified: Secondary | ICD-10-CM | POA: Diagnosis present

## 2012-02-25 DIAGNOSIS — Y92009 Unspecified place in unspecified non-institutional (private) residence as the place of occurrence of the external cause: Secondary | ICD-10-CM

## 2012-02-25 DIAGNOSIS — I10 Essential (primary) hypertension: Secondary | ICD-10-CM | POA: Diagnosis present

## 2012-02-25 DIAGNOSIS — E785 Hyperlipidemia, unspecified: Secondary | ICD-10-CM | POA: Diagnosis present

## 2012-02-25 DIAGNOSIS — W19XXXA Unspecified fall, initial encounter: Secondary | ICD-10-CM | POA: Diagnosis present

## 2012-02-25 DIAGNOSIS — F0390 Unspecified dementia without behavioral disturbance: Secondary | ICD-10-CM | POA: Diagnosis present

## 2012-02-25 DIAGNOSIS — G8929 Other chronic pain: Secondary | ICD-10-CM | POA: Diagnosis present

## 2012-02-25 DIAGNOSIS — R531 Weakness: Secondary | ICD-10-CM

## 2012-02-25 DIAGNOSIS — M79609 Pain in unspecified limb: Secondary | ICD-10-CM | POA: Diagnosis present

## 2012-02-25 DIAGNOSIS — R404 Transient alteration of awareness: Principal | ICD-10-CM | POA: Diagnosis present

## 2012-02-25 DIAGNOSIS — F29 Unspecified psychosis not due to a substance or known physiological condition: Secondary | ICD-10-CM

## 2012-02-25 DIAGNOSIS — R296 Repeated falls: Secondary | ICD-10-CM

## 2012-02-25 DIAGNOSIS — Z79899 Other long term (current) drug therapy: Secondary | ICD-10-CM

## 2012-02-25 HISTORY — DX: Pure hypercholesterolemia, unspecified: E78.00

## 2012-02-25 HISTORY — DX: Essential (primary) hypertension: I10

## 2012-02-25 HISTORY — DX: Unspecified dementia, unspecified severity, without behavioral disturbance, psychotic disturbance, mood disturbance, and anxiety: F03.90

## 2012-02-25 LAB — COMPREHENSIVE METABOLIC PANEL
Albumin: 3.7 g/dL (ref 3.5–5.2)
Alkaline Phosphatase: 110 U/L (ref 39–117)
BUN: 16 mg/dL (ref 6–23)
Calcium: 9.8 mg/dL (ref 8.4–10.5)
GFR calc Af Amer: >90 mL/min (ref >90)
Glucose, Bld: 92 mg/dL (ref 70–99)
Potassium: 3.7 mEq/L (ref 3.5–5.1)
Sodium: 140 mEq/L (ref 135–145)
Total Protein: 7.9 g/dL (ref 6.0–8.3)

## 2012-02-25 LAB — URINALYSIS, ROUTINE W REFLEX MICROSCOPIC
Glucose, UA: NEGATIVE mg/dL
Ketones, ur: NEGATIVE mg/dL
Leukocytes, UA: NEGATIVE
Nitrite: NEGATIVE
Specific Gravity, Urine: 1.025 (ref 1.005–1.030)
pH: 5.5 (ref 5.0–8.0)

## 2012-02-25 LAB — DIFFERENTIAL
Basophils Absolute: 0 K/uL (ref 0.0–0.1)
Basophils Relative: 0 % (ref 0–1)
Eosinophils Absolute: 0.1 K/uL (ref 0.0–0.7)
Eosinophils Relative: 2 % (ref 0–5)
Lymphocytes Relative: 30 % (ref 12–46)
Lymphs Abs: 1.8 K/uL (ref 0.7–4.0)
Monocytes Absolute: 0.4 K/uL (ref 0.1–1.0)
Monocytes Relative: 7 % (ref 3–12)
Neutro Abs: 3.7 K/uL (ref 1.7–7.7)
Neutrophils Relative %: 60 % (ref 43–77)

## 2012-02-25 LAB — MAGNESIUM: Magnesium: 2.1 mg/dL (ref 1.5–2.5)

## 2012-02-25 LAB — URINE MICROSCOPIC-ADD ON

## 2012-02-25 LAB — CBC
MCH: 32.3 pg (ref 26.0–34.0)
MCHC: 34.2 g/dL (ref 30.0–36.0)
MCV: 94.4 fL (ref 78.0–100.0)
Platelets: 161 10*3/uL (ref 150–400)

## 2012-02-25 MED ORDER — OLANZAPINE 5 MG PO TABS
10.0000 mg | ORAL_TABLET | Freq: Two times a day (BID) | ORAL | Status: DC
  Administered 2012-02-25 – 2012-03-01 (×9): 10 mg via ORAL
  Filled 2012-02-25: qty 1
  Filled 2012-02-25 (×2): qty 2
  Filled 2012-02-25: qty 1
  Filled 2012-02-25 (×2): qty 2
  Filled 2012-02-25: qty 1
  Filled 2012-02-25 (×2): qty 2
  Filled 2012-02-25: qty 1
  Filled 2012-02-25 (×2): qty 2
  Filled 2012-02-25: qty 1
  Filled 2012-02-25: qty 2

## 2012-02-25 MED ORDER — ENOXAPARIN SODIUM 40 MG/0.4ML ~~LOC~~ SOLN
40.0000 mg | Freq: Every day | SUBCUTANEOUS | Status: DC
  Administered 2012-02-26 – 2012-03-01 (×3): 40 mg via SUBCUTANEOUS
  Filled 2012-02-25 (×3): qty 0.4

## 2012-02-25 MED ORDER — LISINOPRIL 10 MG PO TABS
10.0000 mg | ORAL_TABLET | Freq: Every day | ORAL | Status: DC
  Administered 2012-02-25 – 2012-03-01 (×6): 10 mg via ORAL
  Filled 2012-02-25 (×9): qty 1

## 2012-02-25 MED ORDER — HALOPERIDOL 2 MG PO TABS
2.0000 mg | ORAL_TABLET | Freq: Every evening | ORAL | Status: DC | PRN
  Administered 2012-02-27: 2 mg via ORAL
  Filled 2012-02-25 (×2): qty 1

## 2012-02-25 MED ORDER — LISINOPRIL 20 MG PO TABS: 20.0000 mg | ORAL_TABLET | Freq: Every day | ORAL | Status: DC

## 2012-02-25 MED ORDER — ASPIRIN 81 MG PO CHEW
81.0000 mg | CHEWABLE_TABLET | Freq: Every day | ORAL | Status: DC
  Administered 2012-02-25 – 2012-03-01 (×6): 81 mg via ORAL
  Filled 2012-02-25 (×6): qty 1

## 2012-02-25 MED ORDER — SODIUM CHLORIDE 0.9 % IV SOLN
INTRAVENOUS | Status: DC
  Administered 2012-02-25 – 2012-02-27 (×4): via INTRAVENOUS

## 2012-02-25 NOTE — ED Notes (Signed)
Ambulated  Pt to bedside commode; pt walked unsteady and got tired after a few steps

## 2012-02-25 NOTE — ED Notes (Addendum)
Pt c/o falling forward onto knees last night. C/o pain to bilateral knee pain. Pt daughter states pt was released from [facility] hospital yesterday.

## 2012-02-25 NOTE — H&P (Signed)
[id number] 

## 2012-02-25 NOTE — ED Provider Notes (Signed)
History     CSN: [id number]  Arrival date & time 02/25/12  1409   First MD Initiated Contact with Patient 02/25/12 1414      Chief Complaint  Patient presents with  . Fall   Level V caveat for dementia  (Consider location/radiation/quality/duration/timing/severity/associated sxs/prior treatment) HPI  Family relates patient was discharged from [facility] hospital yesterday evening after being admitted for 3-4 weeks for control of her schizophrenia and her dementia. They report patient lives home alone. They relate she called twice because she fell today and she was unable to get back up. Patient states her knees hurt and she fell. Family states they can't take care of her and that she is unable to live alone.  PCP Dr. Renard Matter  Past Medical History  Diagnosis Date  . Chronic leg pain   . Poor historian   . Psychosis   . Schizoaffective disorder   . Hypertension   . High cholesterol   . Dementia     Past Surgical History  Procedure Date  . Tubal ligation   . Foot surgery     No family history on file.  History  Substance Use Topics  . Smoking status: Never Smoker   . Smokeless tobacco: Not on file  . Alcohol Use: No  lives alone  OB History    Grav Para Term Preterm Abortions TAB SAB Ect Mult Living                  Review of Systems  All other systems reviewed and are negative.    Allergies  Review of patient's allergies indicates no known allergies.  Home Medications   Current Outpatient Rx  Name Route Sig Dispense Refill  . HALOPERIDOL 2 MG PO TABS Oral Take 8 mg by mouth at bedtime.    Marland Kitchen LISINOPRIL 10 MG PO TABS Oral Take 10 mg by mouth daily.    Marland Kitchen OLANZAPINE 15 MG PO TABS Oral Take 15 mg by mouth at bedtime.    . TRAMADOL HCL 50 MG PO TABS Oral Take 50 mg by mouth every 4 (four) hours as needed. pain    . HALOPERIDOL 2 MG PO TABS Oral Take 1 tablet (2 mg total) by mouth at bedtime. 30 tablet 1    BP 164/85  Pulse 120  Temp(Src) 98.5 F  (36.9 C) (Oral)  Resp 18  Ht 5\' 7"  (1.702 m)  Wt 180 lb (81.647 kg)  BMI 28.19 kg/m2  SpO2 99%  Vital signs normal    Physical Exam  Constitutional: She appears well-developed and well-nourished.  Non-toxic appearance. She does not appear ill. No distress.  HENT:  Head: Normocephalic and atraumatic.  Right Ear: External ear normal.  Left Ear: External ear normal.  Nose: Nose normal. No mucosal edema or rhinorrhea.  Mouth/Throat: Oropharynx is clear and moist and mucous membranes are normal. No dental abscesses or uvula swelling.  Eyes: Conjunctivae and EOM are normal. Pupils are equal, round, and reactive to light.  Neck: Normal range of motion and full passive range of motion without pain. Neck supple.  Cardiovascular: Normal rate, regular rhythm and normal heart sounds.  Exam reveals no gallop and no friction rub.   No murmur heard. Pulmonary/Chest: Effort normal and breath sounds normal. No respiratory distress. She has no wheezes. She has no rhonchi. She has no rales. She exhibits no tenderness and no crepitus.  Abdominal: Soft. Normal appearance and bowel sounds are normal. She exhibits no distension. There is no tenderness.  There is no rebound and no guarding.  Musculoskeletal: Normal range of motion. She exhibits no edema and no tenderness.       Patient is tender diffusely of both knees with a small effusion noted but her knees appear to have arthritic changes in them. There's no abrasions seen to the knees. On range of motion of her leg she possibly has some pain in her hip however it's not clear.  Neurological: She is alert. She has normal strength. No cranial nerve deficit.  Skin: Skin is warm, dry and intact. No rash noted. No erythema. No pallor.  Psychiatric: She has a normal mood and affect. Her speech is normal and behavior is normal. Her mood appears not anxious.    ED Course  Procedures (including critical care time)  Nurses report when they tried to stand patient  up to go to bedside commode she was extremely unsteady and had to have an assistance.  Family states they cannot miss work to take care of patient and she cannot live alone. They want her to be placed.   Dr Janna Arch 20:22 will come admit patient.    Results for orders placed during the hospital encounter of 02/25/12  CBC      Component Value Range   WBC 6.1  4.0 - 10.5 (K/uL)   RBC 3.75 (*) 3.87 - 5.11 (MIL/uL)   Hemoglobin 12.1  12.0 - 15.0 (g/dL)   HCT 16.1 (*) 09.6 - 46.0 (%)   MCV 94.4  78.0 - 100.0 (fL)   MCH 32.3  26.0 - 34.0 (pg)   MCHC 34.2  30.0 - 36.0 (g/dL)   RDW 04.5  40.9 - 81.1 (%)   Platelets 161  150 - 400 (K/uL)  DIFFERENTIAL      Component Value Range   Neutrophils Relative 60  43 - 77 (%)   Neutro Abs 3.7  1.7 - 7.7 (K/uL)   Lymphocytes Relative 30  12 - 46 (%)   Lymphs Abs 1.8  0.7 - 4.0 (K/uL)   Monocytes Relative 7  3 - 12 (%)   Monocytes Absolute 0.4  0.1 - 1.0 (K/uL)   Eosinophils Relative 2  0 - 5 (%)   Eosinophils Absolute 0.1  0.0 - 0.7 (K/uL)   Basophils Relative 0  0 - 1 (%)   Basophils Absolute 0.0  0.0 - 0.1 (K/uL)  COMPREHENSIVE METABOLIC PANEL      Component Value Range   Sodium 140  135 - 145 (mEq/L)   Potassium 3.7  3.5 - 5.1 (mEq/L)   Chloride 102  96 - 112 (mEq/L)   CO2 28  19 - 32 (mEq/L)   Glucose, Bld 92  70 - 99 (mg/dL)   BUN 16  6 - 23 (mg/dL)   Creatinine, Ser 9.14  0.50 - 1.10 (mg/dL)   Calcium 9.8  8.4 - 78.2 (mg/dL)   Total Protein 7.9  6.0 - 8.3 (g/dL)   Albumin 3.7  3.5 - 5.2 (g/dL)   AST 13  0 - 37 (U/L)   ALT 12  0 - 35 (U/L)   Alkaline Phosphatase 110  39 - 117 (U/L)   Total Bilirubin 0.4  0.3 - 1.2 (mg/dL)   GFR calc non Af Amer 85 (*) >90 (mL/min)   GFR calc Af Amer >90  >90 (mL/min)  URINALYSIS, ROUTINE W REFLEX MICROSCOPIC      Component Value Range   Color, Urine YELLOW  YELLOW    APPearance CLEAR  CLEAR  Specific Gravity, Urine 1.025  1.005 - 1.030    pH 5.5  5.0 - 8.0    Glucose, UA NEGATIVE  NEGATIVE  (mg/dL)   Hgb urine dipstick TRACE (*) NEGATIVE    Bilirubin Urine NEGATIVE  NEGATIVE    Ketones, ur NEGATIVE  NEGATIVE (mg/dL)   Protein, ur NEGATIVE  NEGATIVE (mg/dL)   Urobilinogen, UA 0.2  0.0 - 1.0 (mg/dL)   Nitrite NEGATIVE  NEGATIVE    Leukocytes, UA NEGATIVE  NEGATIVE   URINE MICROSCOPIC-ADD ON      Component Value Range   Squamous Epithelial / LPF RARE  RARE    Bacteria, UA RARE  RARE    Crystals CA OXALATE CRYSTALS (*) NEGATIVE    Laboratory interpretation all normal   Dg Chest 1 View  02/25/2012  *RADIOLOGY REPORT*  Clinical Data: Diffuse body aches  CHEST - 1 VIEW  Comparison: Chest radiograph 01/27/2012  Findings: Normal mediastinum and cardiac silhouette.  Normal pulmonary  vasculature.  No evidence of effusion, infiltrate, or pneumothorax.  No acute bony abnormality.  IMPRESSION: No acute cardiopulmonary process.  Original Report Authenticated By: Genevive Bi, M.D.   Dg Pelvis 1-2 Views  02/25/2012  *RADIOLOGY REPORT*  Clinical Data: Diffuse body aches  PELVIS - 1-2 VIEW  Comparison: Lumbar spine 12/07/2010  Findings: Hips are located.  No evidence of pelvic fracture sacral fracture. There is joint space narrowing of the hips.  IMPRESSION: No evidence of pelvic fracture.  Osteoarthritis of the hips.  Original Report Authenticated By: Genevive Bi, M.D.   Ct Head Wo Contrast  02/25/2012  *RADIOLOGY REPORT*  Clinical Data: Status post fall.  Headache with bilateral leg pain and numbness.  CT HEAD WITHOUT CONTRAST  Technique:  Contiguous axial images were obtained from the base of the skull through the vertex without contrast.  Comparison: Head CT 10/14/2010.  Findings: There is no evidence of acute intracranial hemorrhage, mass lesion, brain edema or extra-axial fluid collection.  The ventricles and subarachnoid spaces are appropriately sized for age. There is no CT evidence of acute cortical infarction.  Intracranial vascular calcifications are again noted.  The visualized  paranasal sinuses are clear. The calvarium is diffusely thinned mineralized with scattered lucencies, similar to the prior examination.  There is diffuse calvarial thickening.  IMPRESSION:  1.  No acute intracranial findings. 2.  Stable calvarial the mineralization and scattered lucencies.  Original Report Authenticated By: Gerrianne Scale, M.D.   Dg Knee Complete 4 Views Left  02/25/2012  *RADIOLOGY REPORT*  Clinical Data: Bodyaches, fall  LEFT KNEE - COMPLETE 4+ VIEW  Comparison: None.  Findings: There is no fracture dislocation of the left knee.  No joint effusion.  There is severe narrowing of the medial joint space with osteophytosis.  Spurring patella.  IMPRESSION: Severe osteoarthritis of the medial compartment.  No acute findings.  Original Report Authenticated By: Genevive Bi, M.D.   Dg Knee Complete 4 Views Right  02/25/2012  *RADIOLOGY REPORT*  Clinical Data: Diffuse body aches  RIGHT KNEE - COMPLETE 4+ VIEW  Comparison: Plain film 09/28/2009  Findings: There is narrowing of the medial joint space with associate osteophytosis.  No evidence of fracture dislocation.  No joint effusion.  Spurring of the patella.  IMPRESSION:  1.  No acute findings.  No change from prior. 2.  Osteoarthritis of the medial compartment.  Original Report Authenticated By: Genevive Bi, M.D.     1. Falling   2. Weakness   3. Dementia  Plan admission for placement  Devoria Albe, MD, FACEP   MDM          Ward Givens, MD 02/25/12 2024

## 2012-02-25 NOTE — ED Notes (Signed)
Report called to floor. Pt ready for transfer.

## 2012-02-25 NOTE — ED Notes (Signed)
Pt in CT.

## 2012-02-26 LAB — TSH: TSH: 2.331 u[IU]/mL (ref 0.350–4.500)

## 2012-02-26 LAB — HEPATIC FUNCTION PANEL: Bilirubin, Direct: <0.1 mg/dL (ref 0.0–0.3)

## 2012-02-26 NOTE — Evaluation (Signed)
Physical Therapy Evaluation Patient Details Name: [name] MRN: [id number] DOB: 08/10/1930 Today's Date: 02/26/2012  Problem List:  Patient Active Problem List  Diagnoses  . HEMORRHOIDS, INTERNAL  . GERD  . OTHER DYSPHAGIA  . CERVICAL MUSCLE STRAIN  . COLONIC POLYPS, ADENOMATOUS, HX OF    Past Medical History:  Past Medical History  Diagnosis Date  . Chronic leg pain   . Poor historian   . Psychosis   . Schizoaffective disorder   . Hypertension   . High cholesterol   . Dementia    Past Surgical History:  Past Surgical History  Procedure Date  . Tubal ligation   . Foot surgery     PT Assessment/Plan/Recommendation PT Assessment Clinical Impression Statement: Pt with decreased strength and balance who will benefit from SNF but does not desire to go at this time. PT Recommendation/Assessment: Patient will need skilled PT in the acute care venue PT Problem List: Decreased strength;Decreased activity tolerance;Decreased balance;Decreased mobility PT Therapy Diagnosis : Difficulty walking;Generalized weakness PT Plan PT Frequency: Min 3X/week PT Treatment/Interventions: Gait training;Therapeutic activities;Therapeutic exercise PT Recommendation Follow Up Recommendations: Skilled nursing facility Equipment Recommended: Rolling walker with 5" wheels PT Goals  Acute Rehab PT Goals PT Goal Formulation: With patient Time For Goal Achievement: 4 days Pt will go Supine/Side to Sit: with modified independence PT Goal: Supine/Side to Sit - Progress: Goal set today Pt will go Sit to Supine/Side: Independently PT Goal: Sit to Supine/Side - Progress: Goal set today Pt will Stand: Independently;1 - 2 min PT Goal: Stand - Progress: Goal set today Pt will Ambulate: 51 - 150 feet PT Goal: Ambulate - Progress: Goal set today  PT Evaluation Precautions/Restrictions  Precautions Precautions: Fall Restrictions Weight Bearing Restrictions: No Prior Functioning  Home  Living Lives With: Alone Available Help at Discharge: Family Type of Home: House Home Access: Stairs to enter Secretary/administrator of Steps: 1 Entrance Stairs-Rails: Right Home Layout: One level Bathroom Shower/Tub: Engineer, manufacturing systems: Standard Home Adaptive Equipment: Straight cane Prior Function Able to Take Stairs?: Yes Driving: No Vocation: Retired Financial risk analyst Arousal/Alertness: Awake/alert Overall Cognitive Status: Appears within functional limits for tasks assessed Orientation Level: Disoriented to place;Disoriented to person;Disoriented to time;Disoriented to situation Extremity/Trunk Assessment   Mobility (including Balance) Bed Mobility Bed Mobility: Yes Supine to Sit: 6: Modified independent (Device/Increase time) Sit to Supine: 6: Modified independent (Device/Increase time) Transfers Transfers: Yes Sit to Stand: 5: Supervision Stand to Sit: 6: Modified independent (Device/Increase time) Stand Pivot Transfers: 5: Supervision Ambulation/Gait Ambulation/Gait: Yes Ambulation/Gait Assistance: 5: Supervision Ambulation Distance (Feet): 20 Feet Assistive device: Rolling walker Stairs: No Wheelchair Mobility Wheelchair Mobility: No    Exercise  General Exercises - Lower Extremity Quad Sets: Strengthening;10 reps Long Arc Quad: Strengthening;10 reps Mini-Sqauts:  (bridges x 10) End of Session PT - End of Session Equipment Utilized During Treatment: Gait belt Activity Tolerance: Patient tolerated treatment well Patient left: in bed;with bed alarm set;with call bell in reach General Behavior During Session: [facility] for tasks performed Cognition: [facility] for tasks performed2  Carey Lafon,CINDY 02/26/2012, 5:44 PM

## 2012-02-26 NOTE — Progress Notes (Signed)
Pharmacy Consult: Lovenox for VTE prophylaxis.  Patient Measurements: Height: 5\' 7"  (170.2 cm) Weight: 180 lb (81.647 kg) IBW/kg (Calculated) : 61.6   VITALS Filed Vitals:   02/26/12 0448  BP: 130/83  Pulse: 107  Temp: 98.6 F (37 C)  Resp: 20    INR Last Three Days: No results found for this basename: INR:3 in the last 72 hours  CBC:    Component Value Date/Time   WBC 6.1 02/25/2012 1521   RBC 3.75* 02/25/2012 1521   HGB 12.1 02/25/2012 1521   HCT 35.4* 02/25/2012 1521   PLT 161 02/25/2012 1521   MCV 94.4 02/25/2012 1521   MCH 32.3 02/25/2012 1521   MCHC 34.2 02/25/2012 1521   RDW 12.9 02/25/2012 1521   LYMPHSABS 1.8 02/25/2012 1521   MONOABS 0.4 02/25/2012 1521   EOSABS 0.1 02/25/2012 1521   BASOSABS 0.0 02/25/2012 1521    RENAL FUNCTION: Estimated Creatinine Clearance: 62.7 ml/min (by C-G formula based on Cr of 0.59).  Assessment: Dose stable for age, weight, renal function and indication.  Plan: Sign off.  Mady Gemma, [facility] 02/26/2012 12:37 PM

## 2012-02-26 NOTE — H&P (Signed)
NAME:  [name], [name] NO.:  [id number]  MEDICAL RECORD NO.:  [id number]  LOCATION:                                 FACILITY:  PHYSICIAN:  Melvyn Novas, MDDATE OF BIRTH:  10-26-1930  DATE OF ADMISSION:  02/25/2012 DATE OF DISCHARGE:  LH                             HISTORY & PHYSICAL   HISTORY OF PRESENT ILLNESS:  The patient is a 78 year old, black female, patient of Dr. Renard Matter, who just released from a 3-week stay in [place] in [facility], Psychiatry due to severe dementia and schizophrenia.  Returned home with family as of 4 o'clock yesterday, and daughter states she fell twice and is not moving much.  She comes into the ER.  CAT scan of her head essentially was negative, and x-rays of her hips and knees reveal no significant abnormalities and the patient is very mobile or verbal, possibly due to underlying conditions and/or oversedation with psychotropic medicines.  She will be admitted to sort out the above details.  The patient is a poor historian.  Most of the history was taken from the chart, electronic.  PAST MEDICAL HISTORY:  Significant for chronic foot pain, psychosis, schizoaffective disorder, hypertension, hyperlipidemia, dementia.  PAST SURGICAL HISTORY:  Remarkable for tubal ligation and foot surgery.  She never smoked.  She has 8 children, 1 is deceased.  Does not imbibe alcohol.  She has no known allergies.  CURRENT MEDICINES: 1. Haldol 2 mg tablets, either 1 tablet or 4 tablets at night. 2. Lisinopril 10 mg p.o. daily. 3. Zyprexa 15 mg p.o. nightly. 4. Tramadol 50 mg p.o. q.4 h. p.r.n.  PHYSICAL EXAMINATION:  VITAL SIGNS:  Blood pressure 164/85, temperature 98.5, respiratory rate is 18, O2 sat is 99% on room air. HEENT:  Eyes PERRLA.  Extraocular movements intact.  Sclerae clear. Conjunctivae pink. NECK:  No JVD.  No carotid bruits.  No thyromegaly or carotid bruits. LUNGS:  Diminished breath sounds at the  bases.  No rales, wheeze, or rhonchi appreciable. HEART:  Regular rhythm.  No S3, S4.  No heaves, thrills, or rubs. ABDOMEN:  Soft, nontender.  Bowel sounds normoactive.  No guarding or rebound.  No mass or organomegaly. EXTREMITIES:  Trace to 1+ pedal edema. NEUROLOGIC:  The patient moves all 4 extremities.  Plantars are downgoing.  IMPRESSION: 1. Severe dementia. 2. Schizophrenia. 3. Status post fall x2 today with no evidence of acute central nervous     system defects. 4. Possible oversedation from psychotropic drugs and analgesics. 5. Hypertension is suboptimally controlled.  The patient was admitted with the intention of finding placement for skilled nursing facility.  According to the daughter, she states she cannot take care of her mother.     Melvyn Novas, MD     RMD/MEDQ  D:  02/25/2012  T:  02/26/2012  Job:  [id number]

## 2012-02-26 NOTE — Progress Notes (Signed)
Clinical Social Work;  Aware of consult, however [facility] is not up for viewing and patient at this time is not meeting criteria for inpatient.  Patient's daughter was contacted for more information and also to discuss options at dc with ?referral to ALF or SNF, however could not reach, thus left message.  Will follow up with daughter and complete appropriate referrals once able.

## 2012-02-26 NOTE — Progress Notes (Signed)
NAMEMarland [name]  Janet, CASA               ACCOUNT NO.:  [id number]  MEDICAL RECORD NO.:  [id number]  LOCATION:  A313                          FACILITY:  [facility]  PHYSICIAN:  Adilson Grafton G. Renard Matter, MD   DATE OF BIRTH:  [birth year]  DATE OF PROCEDURE: DATE OF DISCHARGE:                                PROGRESS NOTE   This patient apparently had been discharged from [facility] yesterday after having been there for 3-4 weeks to control schizophrenia and dementia.  She apparently fell, was unable to get up.  States her knees hurt.  Family states she cannot take care of herself.  Unable to live at home alone.  Came to the emergency room, was admitted.  No evidence of fracture in knees.  OBJECTIVE:  VITAL SIGNS:  Blood pressure 130/83, respirations 20, pulse 107, temp 98.6. HEENT:  Eyes PERRLA.  TM negative.  Oropharynx benign. NECK:  Supple.  No JVD or thyroid abnormalities. HEART:  Regular rhythm.  No murmurs. LUNGS:  Clear to P and A. ABDOMEN:  No palpable organs or masses. EXTREMITIES:  The patient has tenderness over both of the knees and arthritic changes.  No abrasions.  The patient's chest x-ray, no acute process.  Lumbar spine, no evidence of pelvic fracture or sacral fracture, osteoarthritic changes in the hips and knees.  CT, no acute intracranial hemorrhage.  Left knee x-ray, severe osteoarthritis of medial compartment, right knee and osteoarthritis.  ASSESSMENT:  The patient has had falls at home and has weakness and dementia and schizophrenia.  PLAN:  To continue supportive measures.  We will attempt placement in nursing facility.     Brileigh Sevcik G. Renard Matter, MD     AGM/MEDQ  D:  02/26/2012  T:  02/26/2012  Job:  [id number]

## 2012-02-27 LAB — HEPATIC FUNCTION PANEL
Albumin: 2.8 g/dL — ABNORMAL LOW (ref 3.5–5.2)
Total Protein: 6.2 g/dL (ref 6.0–8.3)

## 2012-02-27 MED ORDER — TUBERCULIN PPD 5 UNIT/0.1ML ID SOLN
5.0000 [IU] | Freq: Once | INTRADERMAL | Status: AC
  Administered 2012-02-27: 5 [IU] via INTRADERMAL
  Filled 2012-02-27: qty 0.1

## 2012-02-27 NOTE — Progress Notes (Signed)
UR Chart Review Completed  

## 2012-02-27 NOTE — Clinical Social Work Psychosocial (Signed)
Clinical Social Work Department BRIEF PSYCHOSOCIAL ASSESSMENT 02/27/2012  Patient:  [name], [name]     Account Number:  [id number]     Admit date:  02/25/2012  Clinical Social Worker:  Andres Shad  Date/Time:  02/27/2012 09:07 AM  Referred by:  Physician  Date Referred:  02/27/2012 Referred for  SNF Placement   Other Referral:   patient family can no longer take care of patient, thus wants her to be placed   Interview type:  Family Other interview type:   Patient was asleet at time of assessment, spoke with patient daughter via phone.    PSYCHOSOCIAL DATA Living Status:  ALONE Admitted from facility:   Level of care:   Primary support name:  daughter Primary support relationship to patient:  FAMILY Degree of support available:   Limited by family all working and not able to provide care.  Patient daughter as other children who check in on patient, but report she cannot live alone and they cannot care for her    CURRENT CONCERNS Current Concerns  Post-Acute Placement   Other Concerns:   Capacity, if patient has capacity then she will need to make her own deicisions    SOCIAL WORK ASSESSMENT / PLAN Received consult for post acute placement, in which patient was admitted to [facility] for psychosis, Dementia, and hx of Schizophrenia.  Patient had a stay of three weeks and then was released home to self care to her own apartment.  Family was checking in on patient and report she was not taking good care of herself and brought her to the hospital, in which she was admitted for a social admit for placement.  Patient at this time is refusing services, and patient is also not meeting criteria for inpatient to go to a SNF (she is observation).  However, patient also has Medicaid in which if she would consider going to an ALF, CSW can send referral.  Patient family is on board for referrals to an ALF and will work to also get patient on board, even if it is  just for a short time.  Will speak with CM about patient's hospital status and also work with patient.  If patient is deemed to have capacity and she wants to go home, then she has that right regardless of family wishes.  If need be will speak with MD in efforts to obtain capacity and also ?tele psych.  Will follow up with clear disposition and plan.   Assessment/plan status:  Psychosocial Support/Ongoing Assessment of Needs Other assessment/ plan:   Capacity and referral to ALF vs SNF   Information/referral to community resources:   unknown at this time.    PATIENT'S/FAMILY'S RESPONSE TO PLAN OF CARE: Family is very concerned with patient care and her ability to care for self.  Would like to see her placed in SNF or ALF depending of status in hospital.  Patient at this time is not agreeable to plan, and wants to return home at dc to her apartment.    Ashley Jacobs, MSW LCSW [phone]

## 2012-02-27 NOTE — Progress Notes (Signed)
Clinical Social Work:  Following for ?placement in SNF vs ALF.  Patient is still adamently refusing SNF and ALF as this time.  Patient is only in observation and if going to be placed with be at ALF.  Spoke with patient's attending MD and explained patient refusing.  MD agreed to not hold patient here if she is not agreeable, because we cannot force her to be placed.  Daughter in the room  and is aware of the situation.    Spoke with daughter about [facility] and CSW that can check on patient and if patient agrees or needs to be placed in ALF from home, this can be arranged.  Spoke with CM regarding placement and also HH and she will work to get the order and arrange HH.  Family has been checking in on patient, patient walked with therapy, and she is deconditioned but still not wanting any type of therapy.  No other needs at this time.  Will sign off.  Ashley Jacobs, MSW LCSW [phone]

## 2012-02-27 NOTE — Progress Notes (Signed)
   CARE MANAGEMENT NOTE 02/27/2012  Patient:  [name], [name]   Account Number:  [id number]  Date Initiated:  02/27/2012  Documentation initiated by:  Sharrie Rothman  Subjective/Objective Assessment:   Pt admitted from home with falls. Pt just discharged from [facility] on Sunday 02/25/12 and fell at home and was brought to the hospital. Pt family is requesting placement. Pt lives alone in apartment.     Action/Plan:   Pt referred to CSW for placement. Pt unable to be placed due to admission status and pt is refusing ALF. Will arrange HH at discharge.   Anticipated DC Date:  03/03/2012   Anticipated DC Plan:  HOME W HOME HEALTH SERVICES  In-house referral  Clinical Social Worker      DC Planning Services  CM consult      [facility] Choice  HOME HEALTH  DURABLE MEDICAL EQUIPMENT   Choice offered to / List presented to:  C-1 Patient   DME arranged  WALKER - ROLLING  BEDSIDE COMMODE      DME agency  [facility]     HH arranged  HH-1 RN  HH-2 PT  HH-6 SOCIAL WORKER      HH agency  [facility]   Status of service:  Completed, signed off Medicare Important Message given?  YES (If response is "NO", the following Medicare IM given date fields will be blank) Date Medicare IM given:  02/27/2012 Date Additional Medicare IM given:    Discharge Disposition:  HOME W HOME HEALTH SERVICES  Per UR Regulation:    If discussed at Long Length of Stay Meetings, dates discussed:    Comments:  02/27/12 1318 Arlyss Queen, RN BSN CM Pt to be discharged later today. Pt has chosen [facility] for [facility] RN and PT and SW. Pt also requesting rolling walker and BSC. Alroy Bailiff of [facility] is aware of pt and will collect information from the chart. Tula Nakayama of [facility] will bring walker to pt room before discharge and will have BSC delivered to the pts home. No other CM needs noted at this time.

## 2012-02-27 NOTE — Progress Notes (Signed)
Clinical Social Work:  Following for clear disposition for patient.  Met with family and case manger Tammy with regards to plan for patient when she is discharged.  Family is very concerned about patient safety, thus reporting they are not in agreement with MD decision to dc her home with Home Health.  Patient continued to refuse ALF placement and CSW and CM both explained it would be short term due to weakness and need for continued care instead of going home alone.  Patient finally agreed to two weeks (however this waxes and wanes on an hourly basis).  MD was contacted with regards to barrier to dc and also for Home Health.  Made referral for ALF: [facility] who spoke to [facility] and she will be on her way to visit patient this afternoon. Fl2 completed and need MD to sign. Passr has also been completed and submitted. Awaiting the above mentioned factors to determine placement.    Will follow up.  Ashley Jacobs, MSW LCSW [phone]

## 2012-02-27 NOTE — Progress Notes (Signed)
Physical Therapy Treatment Patient Details Name: [name] MRN: [id number] DOB: [birth year] Today's Date: 02/27/2012  TIME: 1019-1046/ 1 TE 1 GT   PT Assessment/Plan  PT - Assessment/Plan Comments on Treatment Session: Patient is cooperative;however somewhat confused and  staying on task requires constant verbal and sometimes tactile cueing. Pt completed a total of 63'  (38' x2) of gait training with RW;Min A . Patient is somewhat impulsive and this puts her at risk for falls PT Goals  Acute Rehab PT Goals PT Goal: Supine/Side to Sit - Progress: Met PT Goal: Sit to Supine/Side - Progress: Progressing toward goal PT Goal: Stand - Progress: Progressing toward goal PT Goal: Ambulate - Progress: Progressing toward goal  PT Treatment Precautions/Restrictions  Precautions Precautions: Fall Restrictions Weight Bearing Restrictions: No Mobility (including Balance) Bed Mobility Supine to Sit: 6: Modified independent (Device/Increase time) (multiple verbal cues to stay on task) Sitting - Scoot to Edge of Bed: 6: Modified independent (Device/Increase time) Sit to Supine: 6: Modified independent (Device/Increase time) Scooting to [facility]: 5: Set up;With rail Scooting to [facility] Details (indicate cue type and reason): trendelenburg and use of rail Transfers Transfers: Yes Sit to Stand: 5: Supervision Stand to Sit: 5: Supervision Stand to Sit Details: verbal cues needed for reaching back and making sure both LEs are touching surface before sitting Ambulation/Gait Ambulation/Gait: Yes Ambulation/Gait Assistance: 4: Min assist Ambulation/Gait Assistance Details (indicate cue type and reason): assistance needed with guiding RW especially with turns;verbal cues needed to keep pt on task Ambulation Distance (Feet): 76 Feet (38' x2= with seated rest break) Assistive device: Rolling walker Gait Pattern: Trunk flexed    Exercise  General Exercises - Lower Extremity Ankle Circles/Pumps: Both;5 reps  (patient was confused with technique) Quad Sets: Both;10 reps Short Arc Quad: Both;10 reps Long Arc Quad: 10 reps;Both Heel Slides: Both;10 reps Hip ABduction/ADduction: 10 reps;Both Toe Raises: Standing;Both;10 reps Heel Raises: Standing;10 reps;Both Other Exercises Other Exercises: 1 min of weight shifting- side<>side/fwd/back with out UE support Other Exercises: standing hip flexion x10 bilaterally with RW End of Session PT - End of Session Equipment Utilized During Treatment: Gait belt Activity Tolerance: Patient tolerated treatment well;Other (comment) (patient is somewhat confused and requires constant cueing) Patient left: in bed;with call bell in reach;with bed alarm set;with family/visitor present General Behavior During Session: [facility] for tasks performed Cognition: Impaired, at baseline  Yazmin Locher ATKINSO 02/27/2012, 11:04 AM

## 2012-02-27 NOTE — Progress Notes (Signed)
NAMEMarland Kitchen  DILLAN, LUNDEN               ACCOUNT NO.:  [id number]  MEDICAL RECORD NO.:  [id number]  LOCATION:  A313                          FACILITY:  [facility]  PHYSICIAN:  Jordanny Waddington G. Renard Matter, MD   DATE OF BIRTH:  04/06/1930  DATE OF PROCEDURE: DATE OF DISCHARGE:                                PROGRESS NOTE   This patient has schizophrenia and dementia, had falls with knee injuries, unable to live at home, came to emergency room, was admitted. No evidence of fracture in the knees.  PHYSICAL EXAMINATION:  GENERAL:  Alert female. VITAL SIGNS:  Blood pressure 130/65, respirations 20, pulse 81, temp of 98.2. LUNGS:  Clear to P and A. HEART:  Regular rhythm. ABDOMEN:  No palpable organs or masses.  ASSESSMENT:  The patient has severe dementia, schizophrenia, had falls prior to admission.  PLAN:  To have social service attempt placement in nursing facility as the patient cannot be cared for at home.     Kamal Jurgens G. Renard Matter, MD     AGM/MEDQ  D:  02/27/2012  T:  02/27/2012  Job:  [id number]

## 2012-02-28 LAB — HEPATIC FUNCTION PANEL
ALT: 10 U/L (ref 0–35)
Albumin: 3.2 g/dL — ABNORMAL LOW (ref 3.5–5.2)
Alkaline Phosphatase: 93 U/L (ref 39–117)
Total Protein: 7.2 g/dL (ref 6.0–8.3)

## 2012-02-28 MED ORDER — HALOPERIDOL 2 MG PO TABS: 2.0000 mg | ORAL_TABLET | Freq: Every day | ORAL | Status: DC

## 2012-02-28 NOTE — Discharge Summary (Signed)
NAMEMarland Kitchen  [name], [name]               ACCOUNT NO.:  [id number]  MEDICAL RECORD NO.:  [id number]  LOCATION:  A313                          FACILITY:  [facility]  PHYSICIAN:  Dontee Jaso G. Renard Matter, MD   DATE OF BIRTH:  November 21, 1929  DATE OF ADMISSION:  02/25/2012 DATE OF DISCHARGE:  04/17/2013LH                              DISCHARGE SUMMARY   DIAGNOSES: 1. Schizophrenic affective disorder. 2. Hypertension. 3. Dyslipidemia. 4. Dementia.  The patient's condition is stable at the time of discharge.  A 78 year old female had been released after 3-week stay at [facility] in [facility] where she was treated for severe dementia and schizophrenia.  Apparently, she fell twice at home, was brought to the ED where she was evaluated.  CT of the head was essentially negative.  X- rays of the hips and knees revealed no significant abnormalities.  It was felt she may have been over sedated from her medications.  OBJECTIVE:  VITAL SIGNS:  Blood pressure 164/85, respirations 18, temperature 98.5. HEENT:  Eyes:  PERRLA.  TM negative.  Oropharynx benign. NECK:  Supple.  No JVD or thyroid abnormalities. LUNGS:  Diminished breath sounds. HEART:  Regular rhythm.  No murmurs. ABDOMEN:  No palpable organs or masses.  No organomegaly. NEUROLOGIC:  Cranial nerves intact.  No motor or sensory abnormalities. Reflexes normal.  LABORATORY DATA:  CBC on admission, WBC 6100 with hemoglobin 12.1, hematocrit 35.4, 60 neutrophils, 30 lymphocytes.  Urinalysis negative. Chemistries:  Sodium 140, potassium 3.7, chloride 102, CO2 of 28, BUN 16, creatinine 0.59, calcium 9.8.  CT of the head, no acute intracranial findings.  Stable calvarial and mineralization and scattered lucencies. X-ray of pelvis, no evidence of pelvic fracture, osteoarthritis of the hips.  HOSPITAL COURSE:  The patient at the time of admission was started on IV fluids.  Saline at 100 mL per hour.  She was also given aspirin 81 mg daily and was  started on DVT prophylaxis with Lovenox 40 mg subcutaneously daily, started on lisinopril 10 mg daily, and Zyprexa 10 mg daily.  She was given haloperidol 2 mg at bedtime p.r.n.  The patient remained fairly alert during the hospital stay.  She had no further falls.  Social service was involved with her care.  Attempts were made to have her placed in a nursing facility, but the patient refused to go to the nursing facility.  She had been treated previously at [facility] for psychosis, dementia, and schizophrenia.  The patient was felt not to meet criteria for inpatient and skilled nursing facility.  She seen by physical therapy and was ambulatory, but was felt she would need a walker and other support from physical therapy. Arrangements were made for her to be seen by home health, and she was discharged after 3 days hospitalization.  Discharged on the following medications: 1. Haldol 2 mg at bedtime. 2. Aspirin 81 mg daily. 3. Prinivil 10 mg daily. 4. Zyprexa 10 mg daily.     Meeyah Ovitt G. Renard Matter, MD     AGM/MEDQ  D:  02/28/2012  T:  02/28/2012  Job:  [id number]

## 2012-02-28 NOTE — Progress Notes (Addendum)
Clinical Social Work:  Following patient for ALF placement in which patient is still agreeable this morning as well as yesterday evening. She is accepted at [facility].  Patient however has been referred for a LEVEL2 PASSAR in which she will need a face to face evaluation by the [facility] before she can be admitted to ALF.  All paperwork has been submitted and awaiting level 2 rep from [facility] to come and see.  Hopeful for dc on 02/29/2012.  Aware of patient's discharge however cannot move yet.  Will continue to follow, will update FL2 and fax to [facility].    Ashley Jacobs, MSW LCSW [phone]  At 1402 Passar representative Dawn came to see patient for level 2.  Reports all looks good and should have passar this evening for anticipated dc tomorrow.  Will follow up and assist with dc planning. HN

## 2012-02-29 NOTE — Progress Notes (Signed)
TB noted to be slightly red with 1.5cm induration.

## 2012-02-29 NOTE — Discharge Summary (Signed)
NAMEMarland Kitchen  [name], [name]               ACCOUNT NO.:  [id number]  MEDICAL RECORD NO.:  [id number]  LOCATION:  A313                          FACILITY:  [facility]  PHYSICIAN:  Demeka Sutter G. Renard Matter, MD   DATE OF BIRTH:  [birth year]  DATE OF ADMISSION:  02/25/2012 DATE OF DISCHARGE:  LH                              DISCHARGE SUMMARY   ADDENDUM  This patient was held over yesterday because of the fact that she was placed in nursing facility, [facility].  She had a fair night and will be sent there today.     Ayn Domangue G. Renard Matter, MD     AGM/MEDQ  D:  02/29/2012  T:  02/29/2012  Job:  [id number]

## 2012-03-01 MED ORDER — POTASSIUM CHLORIDE IN NACL 20-0.45 MEQ/L-% IV SOLN
INTRAVENOUS | Status: AC
  Filled 2012-03-01: qty 1000

## 2012-03-01 NOTE — Progress Notes (Signed)
   CARE MANAGEMENT NOTE 03/01/2012  Patient:  [name], [name]   Account Number:  [id number]  Date Initiated:  02/27/2012  Documentation initiated by:  Sharrie Rothman  Subjective/Objective Assessment:   Pt admitted from home with falls. Pt just discharged from [facility] on Sunday 02/25/12 and fell at home and was brought to the hospital. Pt family is requesting placement. Pt lives alone in apartment.     Action/Plan:   Pt referred to CSW for placement. Pt unable to be placed due to admission status and pt is refusing ALF. Will arrange HH at discharge.   Anticipated DC Date:  03/03/2012   Anticipated DC Plan:  HOME W HOME HEALTH SERVICES  In-house referral  Clinical Social Worker      DC Planning Services  CM consult      [facility] Choice  HOME HEALTH  DURABLE MEDICAL EQUIPMENT   Choice offered to / List presented to:  C-1 Patient   DME arranged  WALKER - ROLLING  BEDSIDE COMMODE      DME agency  [facility]     HH arranged  HH-1 RN  HH-2 PT  HH-6 SOCIAL WORKER      HH agency  [facility]   Status of service:  Completed, signed off Medicare Important Message given?  YES (If response is "NO", the following Medicare IM given date fields will be blank) Date Medicare IM given:  02/27/2012 Date Additional Medicare IM given:    Discharge Disposition:  HOME W HOME HEALTH SERVICES  Per UR Regulation:    If discussed at Long Length of Stay Meetings, dates discussed:    Comments:  03/01/12 1144 Arlyss Queen, RN BSN CMP Pt discharged to [facility] today. [facility] Selinda Flavin, RN is aware and will see pt today. [facility] equipment has been delivered and Home Health arranged. No other CM need noted. CSW will arrange transfer to facility.  02/27/12 1318 Arlyss Queen, RN BSN CM Pt to be discharged later today. Pt has chosen [facility] for [facility] RN and PT and SW. Pt also requesting rolling walker and BSC. Alroy Bailiff of [facility] is aware of pt and will  collect information from the chart. Tula Nakayama of [facility] will bring walker to pt room before discharge and will have BSC delivered to the pts home. No other CM needs noted at this time.

## 2012-03-01 NOTE — Plan of Care (Signed)
Problem: Discharge Progression Outcomes Goal: Other Discharge Outcomes/Goals Outcome: Completed/Met Date Met:  03/01/12 Pt discharged to [facility] with home health

## 2012-03-01 NOTE — Clinical Social Work Placement (Signed)
Clinical Social Work Department CLINICAL SOCIAL WORK PLACEMENT NOTE 03/01/2012  Patient:  LARENA, OHNEMUS  Account Number:  [id number] Admit date:  02/25/2012  Clinical Social Worker:  Ashley Jacobs, LCSW  Date/time:  03/01/2012 10:58 AM  Clinical Social Work is seeking post-discharge placement for this patient at the following level of care:   SKILLED NURSING   (*CSW will update this form in Epic as items are completed)   03/01/2012  Patient/family provided with [facility] Department of Clinical Social Work's list of facilities offering this level of care within the geographic area requested by the patient (or if unable, by the patient's family).  03/01/2012  Patient/family informed of their freedom to choose among providers that offer the needed level of care, that participate in Medicare, Medicaid or managed care program needed by the patient, have an available bed and are willing to accept the patient.  03/01/2012  Patient/family informed of [facility]' ownership interest in [facility], as well as of the fact that they are under no obligation to receive care at this facility.  PASARR submitted to EDS on 02/27/2012 PASARR number received from [employer] on 03/01/2012  FL2 transmitted to all facilities in geographic area requested by pt/family on  02/27/2012 FL2 transmitted to all facilities within larger geographic area on   Patient informed that his/her managed care company has contracts with or will negotiate with  certain facilities, including the following:     Patient/family informed of bed offers received:  02/27/2012 Patient chooses bed at [facility] Physician recommends and patient chooses bed at    Patient to be transferred to  on  03/01/2012 Patient to be transferred to facility by EMS  The following physician request were entered in Epic:   Additional Comments:  Ashley Jacobs, MSW LCSW

## 2012-03-01 NOTE — Progress Notes (Signed)
Clinical Social Work:   Following patient for placement in ALF: [facility].  Passar Level 2 was obtained and cleared with  [facility].  Patient will be dc today by EMS in which CSW has arranged.  Notified [facility] of DC and passar and agreeable for dc.  No other needs at this time.. Will sign off.  Jalaiyah Throgmorton Nail, MSW LCSW

## 2012-05-27 ENCOUNTER — Encounter (HOSPITAL_COMMUNITY): Payer: Self-pay | Admitting: Emergency Medicine

## 2012-05-27 ENCOUNTER — Emergency Department (HOSPITAL_COMMUNITY): Payer: Medicare Other

## 2012-05-27 ENCOUNTER — Inpatient Hospital Stay (HOSPITAL_COMMUNITY)
Admission: EM | Admit: 2012-05-27 | Discharge: 2012-05-30 | DRG: 558 | Disposition: A | Discharge status: Skilled Nursing Facility | Payer: Medicare Other | Attending: Family Medicine | Admitting: Family Medicine

## 2012-05-27 ENCOUNTER — Emergency Department (HOSPITAL_COMMUNITY): Payer: Self-pay

## 2012-05-27 DIAGNOSIS — F0390 Unspecified dementia without behavioral disturbance: Secondary | ICD-10-CM | POA: Diagnosis present

## 2012-05-27 DIAGNOSIS — E7800 Pure hypercholesterolemia, unspecified: Secondary | ICD-10-CM | POA: Diagnosis present

## 2012-05-27 DIAGNOSIS — R51 Headache: Secondary | ICD-10-CM | POA: Diagnosis present

## 2012-05-27 DIAGNOSIS — F29 Unspecified psychosis not due to a substance or known physiological condition: Secondary | ICD-10-CM | POA: Diagnosis present

## 2012-05-27 DIAGNOSIS — E785 Hyperlipidemia, unspecified: Secondary | ICD-10-CM | POA: Diagnosis present

## 2012-05-27 DIAGNOSIS — F259 Schizoaffective disorder, unspecified: Secondary | ICD-10-CM | POA: Diagnosis present

## 2012-05-27 DIAGNOSIS — W010XXA Fall on same level from slipping, tripping and stumbling without subsequent striking against object, initial encounter: Secondary | ICD-10-CM | POA: Diagnosis present

## 2012-05-27 DIAGNOSIS — M549 Dorsalgia, unspecified: Secondary | ICD-10-CM | POA: Diagnosis present

## 2012-05-27 DIAGNOSIS — M79609 Pain in unspecified limb: Secondary | ICD-10-CM | POA: Diagnosis present

## 2012-05-27 DIAGNOSIS — I10 Essential (primary) hypertension: Secondary | ICD-10-CM | POA: Diagnosis present

## 2012-05-27 DIAGNOSIS — W19XXXA Unspecified fall, initial encounter: Secondary | ICD-10-CM

## 2012-05-27 DIAGNOSIS — IMO0002 Reserved for concepts with insufficient information to code with codable children: Secondary | ICD-10-CM | POA: Diagnosis present

## 2012-05-27 DIAGNOSIS — M6282 Rhabdomyolysis: Principal | ICD-10-CM | POA: Diagnosis present

## 2012-05-27 DIAGNOSIS — G8929 Other chronic pain: Secondary | ICD-10-CM | POA: Diagnosis present

## 2012-05-27 DIAGNOSIS — R531 Weakness: Secondary | ICD-10-CM

## 2012-05-27 DIAGNOSIS — Y9210 Unspecified residential institution as the place of occurrence of the external cause: Secondary | ICD-10-CM | POA: Diagnosis present

## 2012-05-27 LAB — LACTIC ACID, PLASMA: Lactic Acid, Venous: 1.5 mmol/L (ref 0.5–2.2)

## 2012-05-27 LAB — CBC WITH DIFFERENTIAL/PLATELET
Basophils Absolute: 0 10*3/uL (ref 0.0–0.1)
Basophils Relative: 0 % (ref 0–1)
Eosinophils Relative: 1 % (ref 0–5)
Lymphocytes Relative: 18 % (ref 12–46)
MCHC: 35.2 g/dL (ref 30.0–36.0)
MCV: 92 fL (ref 78.0–100.0)
Monocytes Absolute: 0.5 10*3/uL (ref 0.1–1.0)
Neutro Abs: 5.8 10*3/uL (ref 1.7–7.7)
Platelets: 149 10*3/uL — ABNORMAL LOW (ref 150–400)
RDW: 13.5 % (ref 11.5–15.5)
WBC: 7.9 10*3/uL (ref 4.0–10.5)

## 2012-05-27 LAB — COMPREHENSIVE METABOLIC PANEL
ALT: 19 U/L (ref 0–35)
AST: 46 U/L — ABNORMAL HIGH (ref 0–37)
Albumin: 3.5 g/dL (ref 3.5–5.2)
CO2: 26 mEq/L (ref 19–32)
Calcium: 9.7 mg/dL (ref 8.4–10.5)
Creatinine, Ser: 0.67 mg/dL (ref 0.50–1.10)
GFR calc non Af Amer: 81 mL/min — ABNORMAL LOW (ref >90)
Sodium: 141 mEq/L (ref 135–145)

## 2012-05-27 LAB — URINALYSIS, ROUTINE W REFLEX MICROSCOPIC
Glucose, UA: NEGATIVE mg/dL
Leukocytes, UA: NEGATIVE
Specific Gravity, Urine: 1.025 (ref 1.005–1.030)
pH: 5.5 (ref 5.0–8.0)

## 2012-05-27 LAB — CARDIAC PANEL(CRET KIN+CKTOT+MB+TROPI)
Relative Index: 0.8 (ref 0.0–2.5)
Troponin I: 0.4 ng/mL (ref <0.30)

## 2012-05-27 LAB — AMMONIA: Ammonia: 14 umol/L (ref 11–60)

## 2012-05-27 LAB — PROTIME-INR
INR: 1.16 (ref 0.00–1.49)
Prothrombin Time: 15 seconds (ref 11.6–15.2)

## 2012-05-27 LAB — URINE MICROSCOPIC-ADD ON

## 2012-05-27 LAB — TROPONIN I: Troponin I: 0.32 ng/mL (ref <0.30)

## 2012-05-27 MED ORDER — POTASSIUM CHLORIDE CRYS ER 20 MEQ PO TBCR
40.0000 meq | EXTENDED_RELEASE_TABLET | Freq: Once | ORAL | Status: AC
  Administered 2012-05-27: 40 meq via ORAL
  Filled 2012-05-27: qty 2

## 2012-05-27 MED ORDER — ENOXAPARIN SODIUM 80 MG/0.8ML ~~LOC~~ SOLN
80.0000 mg | Freq: Once | SUBCUTANEOUS | Status: AC
  Administered 2012-05-27: 80 mg via SUBCUTANEOUS
  Filled 2012-05-27: qty 0.8

## 2012-05-27 MED ORDER — ENOXAPARIN SODIUM 40 MG/0.4ML ~~LOC~~ SOLN
40.0000 mg | SUBCUTANEOUS | Status: DC
  Administered 2012-05-27 – 2012-05-28 (×2): 40 mg via SUBCUTANEOUS
  Filled 2012-05-27 (×2): qty 0.4

## 2012-05-27 MED ORDER — POTASSIUM CHLORIDE 10 MEQ/100ML IV SOLN
10.0000 meq | INTRAVENOUS | Status: AC
  Administered 2012-05-27 (×3): 10 meq via INTRAVENOUS
  Filled 2012-05-27: qty 200
  Filled 2012-05-27: qty 100

## 2012-05-27 MED ORDER — LORAZEPAM 1 MG PO TABS
1.0000 mg | ORAL_TABLET | ORAL | Status: DC | PRN
  Administered 2012-05-28 – 2012-05-29 (×2): 1 mg via ORAL
  Filled 2012-05-27 (×2): qty 1

## 2012-05-27 MED ORDER — IOHEXOL 300 MG/ML  SOLN
100.0000 mL | Freq: Once | INTRAMUSCULAR | Status: AC | PRN
  Administered 2012-05-27: 100 mL via INTRAVENOUS

## 2012-05-27 MED ORDER — MELOXICAM 7.5 MG PO TABS
15.0000 mg | ORAL_TABLET | Freq: Every day | ORAL | Status: DC
  Administered 2012-05-27 – 2012-05-29 (×3): 15 mg via ORAL
  Filled 2012-05-27 (×6): qty 2

## 2012-05-27 MED ORDER — SODIUM CHLORIDE 0.9 % IJ SOLN
INTRAMUSCULAR | Status: AC
  Administered 2012-05-27: 17:00:00
  Filled 2012-05-27: qty 3

## 2012-05-27 MED ORDER — SODIUM CHLORIDE 0.9 % IV SOLN: INTRAVENOUS | Status: AC

## 2012-05-27 MED ORDER — ONDANSETRON HCL 4 MG/2ML IJ SOLN: 4.0000 mg | Freq: Three times a day (TID) | INTRAMUSCULAR | Status: AC | PRN

## 2012-05-27 MED ORDER — HALOPERIDOL 2 MG PO TABS
5.0000 mg | ORAL_TABLET | Freq: Every day | ORAL | Status: DC
  Administered 2012-05-27 – 2012-05-29 (×3): 5 mg via ORAL
  Filled 2012-05-27 (×3): qty 3

## 2012-05-27 MED ORDER — OLANZAPINE 5 MG PO TABS
15.0000 mg | ORAL_TABLET | Freq: Every day | ORAL | Status: DC
  Administered 2012-05-27 – 2012-05-29 (×3): 15 mg via ORAL
  Filled 2012-05-27 (×3): qty 3

## 2012-05-27 MED ORDER — LISINOPRIL 10 MG PO TABS
10.0000 mg | ORAL_TABLET | Freq: Every day | ORAL | Status: DC
  Administered 2012-05-27 – 2012-05-29 (×3): 10 mg via ORAL
  Filled 2012-05-27 (×3): qty 1

## 2012-05-27 MED ORDER — ASPIRIN 81 MG PO CHEW
324.0000 mg | CHEWABLE_TABLET | Freq: Once | ORAL | Status: AC
  Administered 2012-05-27: 324 mg via ORAL
  Filled 2012-05-27: qty 4

## 2012-05-27 NOTE — ED Notes (Addendum)
Pt lying on stretcher in no acute distress.

## 2012-05-27 NOTE — ED Provider Notes (Signed)
History  This chart was scribed for Glynn Octave, MD by Ladona Ridgel Day. This patient was seen in room APA04/APA04 and the patient's care was started at 0944.  CSN: [id number]  Arrival date & time 05/27/12  0944   First MD Initiated Contact with Patient 05/27/12 [phone]      Chief Complaint  Patient presents with  . Fall  . Weakness  . Abdominal Pain    Patient is a 78 y.o. female presenting with fall, weakness, and abdominal pain. The history is provided by the nursing home and the EMS personnel. The history is limited by the condition of the patient (Patient with dementia and appears over sedated. ). No language interpreter was used.  Fall The accident occurred 6 to 12 hours ago. The fall occurred while walking (With her walker last PM. ). There was no blood loss. Point of impact: Limited description due to her dementia.  The pain is present in the head and left elbow (She also states her face and back hurts. ). She was not ambulatory at the scene. Associated symptoms include abdominal pain and headaches. Pertinent negatives include no fever.  Weakness The primary symptoms include headaches. Primary symptoms do not include fever.  The headache is associated with neck stiffness and weakness.   Additional symptoms include neck stiffness and weakness.  Abdominal Pain The primary symptoms of the illness include abdominal pain. The primary symptoms of the illness do not include fever or shortness of breath.  Additional symptoms associated with the illness include back pain.   [name] is a 78 y.o. female brought in by ambulance, who presents to the Emergency Department from [facility] nursing center complaining of constant multiple medical complaints after she reportedly fell several times last PM while walking with her walker but is currently unable to ambulate at this time. She is a level 5 caveat due to dementia. She states face pain, left elbow pain, neck pain, back pain, HA, abdominal  pain, emesis last week, as her associated symptoms.      Past Medical History  Diagnosis Date  . Chronic leg pain   . Poor historian   . Psychosis   . Schizoaffective disorder   . Hypertension   . High cholesterol   . Dementia     Past Surgical History  Procedure Date  . Tubal ligation   . Foot surgery     History reviewed. No pertinent family history.  History  Substance Use Topics  . Smoking status: Never Smoker   . Smokeless tobacco: Not on file  . Alcohol Use: No    OB History    Grav Para Term Preterm Abortions TAB SAB Ect Mult Living                  Review of Systems  Constitutional: Negative for fever.  HENT: Positive for neck pain and neck stiffness. Negative for congestion.   Respiratory: Negative for shortness of breath.   Gastrointestinal: Positive for abdominal pain.  Musculoskeletal: Positive for back pain.  Skin: Negative for color change and pallor.  Neurological: Positive for weakness and headaches.    Allergies  Review of patient's allergies indicates no known allergies.  Home Medications   Current Outpatient Rx  Name Route Sig Dispense Refill  . HALOPERIDOL 5 MG PO TABS Oral Take 5 mg by mouth at bedtime.    Marland Kitchen HALOPERIDOL DECANOATE 100 MG/ML IM SOLN Intramuscular Inject 50 mg into the muscle every 28 (twenty-eight) days. Given at Mental  Health    . LISINOPRIL 10 MG PO TABS Oral Take 10 mg by mouth daily.    Marland Kitchen LORAZEPAM 1 MG PO TABS Oral Take 1 mg by mouth every 4 (four) hours as needed. Agitation    . MELOXICAM 15 MG PO TABS Oral Take 15 mg by mouth daily.    Marland Kitchen OLANZAPINE 15 MG PO TABS Oral Take 15 mg by mouth at bedtime.      Triage Vitals: BP 136/64  Pulse 99  Temp 97.9 F (36.6 C) (Oral)  Resp 18  Ht 5\' 4"  (1.626 m)  Wt 180 lb (81.647 kg)  BMI 30.90 kg/m2  SpO2 100%  Physical Exam  Nursing note and vitals reviewed. Constitutional: She appears well-developed and well-nourished. No distress.       Level 5 caveat due to her  dementia and she appears over sedated.  HENT:  Head: Normocephalic and atraumatic.  Eyes: Conjunctivae and EOM are normal.  Neck: Neck supple. No tracheal deviation present.  Cardiovascular: Normal rate, regular rhythm and normal heart sounds.        +2 Dorsalis pedis pulses.   Pulmonary/Chest: Effort normal. No respiratory distress. She has no wheezes. She has no rales.  Abdominal: Soft. She exhibits no distension. There is tenderness (Diffusly tender abdomen. ). There is no rebound and no guarding.  Musculoskeletal: Normal range of motion. She exhibits tenderness (Midline lumbar pain. ).  Neurological: She is alert. No cranial nerve deficit. Coordination normal.       Oriented X1 only to herself.  Cranial nerves 3-12 intact.  Equal strength bilateral extremities.   Skin: Skin is warm and dry. There is erythema (Abrasion to her left elbow. ).  Psychiatric: She has a normal mood and affect. Her behavior is normal.    ED Course  Procedures (including critical care time) DIAGNOSTIC STUDIES: Oxygen Saturation is 10% on room air, normal by my interpretation.    COORDINATION OF CARE: At 957 AM Discussed treatment plan with patient which includes head/C-spine CT, CXR, blood work, urine analysis, and heart markers. Patient agrees.   Labs Reviewed  CBC WITH DIFFERENTIAL - Abnormal; Notable for the following:    RBC 3.74 (*)     HCT 34.4 (*)     Platelets 149 (*)     All other components within normal limits  COMPREHENSIVE METABOLIC PANEL - Abnormal; Notable for the following:    Potassium 3.2 (*)     Glucose, Bld 102 (*)     AST 46 (*)     GFR calc non Af Amer 81 (*)     All other components within normal limits  URINALYSIS, ROUTINE W REFLEX MICROSCOPIC - Abnormal; Notable for the following:    APPearance HAZY (*)     Hgb urine dipstick MODERATE (*)     Ketones, ur TRACE (*)     Protein, ur TRACE (*)     All other components within normal limits  TROPONIN I - Abnormal; Notable for  the following:    Troponin I 0.32 (*)     All other components within normal limits  URINE MICROSCOPIC-ADD ON - Abnormal; Notable for the following:    Squamous Epithelial / LPF FEW (*)     Bacteria, UA MANY (*)     Crystals CA OXALATE CRYSTALS (*)     All other components within normal limits  LIPASE, BLOOD  AMMONIA  LACTIC ACID, PLASMA  PROTIME-INR  TROPONIN I   Dg Chest 1 View  05/27/2012  *  RADIOLOGY REPORT*  Clinical Data:  Fall  CHEST - 1 VIEW  Comparison: None  Findings: Normal heart size and pulmonary vascularity. Atherosclerotic calcification of a mildly tortuous thoracic aorta. Minimal chronic bronchitic changes. Linear scarring lingula. No definite infiltrate, pleural effusion or pneumothorax. Bones demineralized with bilateral glenohumeral degenerative changes.  IMPRESSION: No acute abnormalities.  Original Report Authenticated By: Lollie Marrow, M.D.   Ct Head Wo Contrast  05/27/2012  *RADIOLOGY REPORT*  Clinical Data:  Fall  CT HEAD WITHOUT CONTRAST CT CERVICAL SPINE WITHOUT CONTRAST  Technique:  Multidetector CT imaging of the head and cervical spine was performed following the standard protocol without intravenous contrast.  Multiplanar CT image reconstructions of the cervical spine were also generated.  Comparison:  02/25/2012  CT HEAD  Findings: Ventricle size is normal.  Age appropriate atrophy. Negative for acute infarct.  Negative for hemorrhage or mass lesion.  Negative for skull fracture.  IMPRESSION: No acute intracranial abnormality.  CT CERVICAL SPINE  Findings: Negative for fracture.  Normal alignment.  Degenerative changes are present in the cervical spine.  There is degenerative change at C1-C2.  Disc degeneration and spondylosis of a mild degree at C3-4 and C5-6.  IMPRESSION: Negative for cervical spine fracture.  Original Report Authenticated By: Camelia Phenes, M.D.   Ct Cervical Spine Wo Contrast  05/27/2012  *RADIOLOGY REPORT*  Clinical Data:  Fall  CT HEAD  WITHOUT CONTRAST CT CERVICAL SPINE WITHOUT CONTRAST  Technique:  Multidetector CT imaging of the head and cervical spine was performed following the standard protocol without intravenous contrast.  Multiplanar CT image reconstructions of the cervical spine were also generated.  Comparison:  02/25/2012  CT HEAD  Findings: Ventricle size is normal.  Age appropriate atrophy. Negative for acute infarct.  Negative for hemorrhage or mass lesion.  Negative for skull fracture.  IMPRESSION: No acute intracranial abnormality.  CT CERVICAL SPINE  Findings: Negative for fracture.  Normal alignment.  Degenerative changes are present in the cervical spine.  There is degenerative change at C1-C2.  Disc degeneration and spondylosis of a mild degree at C3-4 and C5-6.  IMPRESSION: Negative for cervical spine fracture.  Original Report Authenticated By: Camelia Phenes, M.D.   Ct Abdomen Pelvis W Contrast  05/27/2012  *RADIOLOGY REPORT*  Clinical Data: Weakness, fall, abdominal pain  CT ABDOMEN AND PELVIS WITH CONTRAST  Technique:  Multidetector CT imaging of the abdomen and pelvis was performed following the standard protocol during bolus administration of intravenous contrast. Sagittal and coronal MPR images reconstructed from axial data set.  Contrast: OMNIPAQUE IOHEXOL 300 MG/ML  SOLN. No oral contrast.  Comparison: None.  Findings: Dependent atelectasis of bilateral lung bases. Tiny cyst right lobe liver. Liver, spleen, pancreas, kidneys, and adrenal glands normal appearance. Normal appendix. Stomach and bowel loops suboptimally visualized due to lack of GI contrast, no gross abnormalities seen. Normal caliber aorta with minimal scattered atherosclerotic calcification. Decompressed bladder. Uterine calcification question degenerated leiomyoma. No mass, adenopathy, free fluid or inflammatory process. Bones appear diffusely demineralized with bilateral hip joint degenerative changes and scattered degenerative disc / facet  disease changes of the thoracolumbar spine.  IMPRESSION: No acute intra abdominal or intrapelvic abnormalities.  Original Report Authenticated By: Lollie Marrow, M.D.     1. Elevated troponin   2. Fall   3. Generalized weakness       MDM  Demented patient with psychiatric history as well presenting from nursing home with falls and generalized weakness. No focal neurological deficits.  Patient is unreliable historian and complains of diffuse pain. Her abdomen is soft and nontender she does not endorse abdominal pain.  Imaging negative for traumatic injury. Possible UTI urinalysis. No EKG changes. Mildly elevated troponin with normal renal function. Patient denies chest pain.  She appears to be at her baseline with her dementia psychiatric illness. She'll need trending of her cardiac enzymes.  She also still has difficulty walking and may need therapy evaluation. Admission discussed with Dr. Renard Matter.   Date: 05/27/2012 1006  Rate: 89  Rhythm: normal sinus rhythm  QRS Axis: normal  Intervals: normal  ST/T Wave abnormalities: normal  Conduction Disutrbances:none  Narrative Interpretation: T wave inversion Lead III, stable J point elevation V1 and V2  Old EKG Reviewed: unchanged   Date: 05/27/2012 1148  Rate: 96  Rhythm: normal sinus rhythm  QRS Axis: normal  Intervals: normal  ST/T Wave abnormalities: normal  Conduction Disutrbances:none  Narrative Interpretation: T wave inversion Lead III, stable J point elevation V1 and V2  Old EKG Reviewed: unchanged    I personally performed the services described in this documentation, which was scribed in my presence.  The recorded information has been reviewed and considered.    Glynn Octave, MD 05/27/12 [phone]

## 2012-05-27 NOTE — H&P (Signed)
NAMEMarland Kitchen  [name], [name] NO.:  [id number]  MEDICAL RECORD NO.:  [id number]  LOCATION:  A329                          FACILITY:  [facility]  PHYSICIAN:  Shakya Sebring G. Renard Matter, MD   DATE OF BIRTH:  [birth year]  DATE OF ADMISSION:  05/27/2012 DATE OF DISCHARGE:  LH                             HISTORY & PHYSICAL   This 78 year old who resides in local nursing home was brought to the emergency room of [facility] today by EMS after she had fallen there last p.m.  The patient is demented and has schizophrenia.  She was complaining of back pain and pain in her head and left elbow.  She had abdominal pain and headache, apparently was unable to ambulate because of weakness and stiffness in her neck.  She was also having abdominal pain as well.  The patient resides at [facility].  As stated was evaluated by ED physician. Multiple x-rays were obtained.  X-rays of her chest and C-spine were negative for fracture.  CT of the head showed no acute intracranial abnormalities, no infarct, no fracture of skull.  CT of the abdomen showed no acute intracranial abnormality or pelvic abnormality.  The patient was noted to have a slight elevation of her troponin on admission through the ED.  Troponin was 0.32, also slightly low serum potassium, serum potassium was 3.2.  Because of these problems, it was felt the patient should be admitted for observation and rule out acute coronary syndrome.  SOCIAL HISTORY:  The patient does not smoke or use alcohol.  PAST MEDICAL HISTORY:  The patient does have schizophrenic affective disorder and has been in psychiatric institution recently.  She had a history of hypertension, hyperlipidemia, and dementia.  PAST SURGICAL HISTORY:  Prior tubal ligation and foot surgery.  REVIEW OF SYSTEMS:  HEENT:  Negative except for pain and neck stiffness. CARDIOPULMONARY:  No shortness of breath or chest pain. GASTROINTESTINAL:  The patient  has no diarrhea, no vomiting, no hematemesis, but complaints of abdominal pain.  GU:  No dysuria or hematuria.  ALLERGIES:  The patient had no known allergies.  MEDICATION LIST:  Haloperidol 5 mg daily at bedtime.  Intramuscular haloperidol 50 mg every 28 days given by mental health.  Lisinopril 10 mg daily.  Lorazepam 1 mg every 4 hours as needed for agitation. Meloxicam 15 mg daily.  Olanzapine 15 mg at bedtime.  PHYSICAL EXAMINATION:  GENERAL:  Alert female. VITAL SIGNS:  Blood pressure on admission 136/64, respirations 18, pulse 92, temp 97.6. HEENT:  Eyes, PERRLA.  TMs negative.  Oropharynx benign. NECK:  Supple.  No JVD or thyroid abnormalities.  HEART:  Regular rhythm.  No murmurs.  No cardiomegaly. LUNGS:  Clear to P and A. ABDOMEN:  Diffuse tenderness.  Abdomen has no organomegaly. NEUROLOGICAL:  The patient is alert.  No cranial nerve abnormalities. No motor or sensory disturbance.  Reflexes equal bilaterally. SKIN:  The patient has abrasion of left elbow.  ASSESSMENT:  The patient was admitted following a fall at nursing facility.  She has what appears to be abrasion of the left elbow, has diffuse abdominal tenderness.  She also has abnormalities of the  troponin, admitted to rule out coronary artery disease.  She is schizophrenic, has dementia, has hypokalemia.  She did have essentially normal electrocardiogram.     Janet Fruchter G. Renard Matter, MD     AGM/MEDQ  D:  05/27/2012  T:  05/27/2012  Job:  [id number]

## 2012-05-27 NOTE — ED Notes (Signed)
Gave report to donna at [facility] nursing center.

## 2012-05-27 NOTE — ED Notes (Signed)
Pt sent from [facility] for c/o multiple falls and being unable to walk.

## 2012-05-27 NOTE — ED Notes (Signed)
Pt lying quietly on stretcher with no complaints at this time.

## 2012-05-27 NOTE — ED Notes (Signed)
Pt is currently in xray.

## 2012-05-27 NOTE — Progress Notes (Signed)
Patient given information about Advance Directives.

## 2012-05-27 NOTE — ED Notes (Signed)
Pt is back in room 4 and placed on monitor.

## 2012-05-27 NOTE — Progress Notes (Signed)
Dr Ardelle Balls called and ordered STAT EKG due to elevated cardiae enzymes. Asked if cardiology had seen pt. Consult was placed at 1845 tonight. EKG performed. Dr Ardelle Balls to floor. Discussed elevated enzymes with Dr Ardelle Balls. Discussed if it was due to falls and possibly rhabdomyolysis. Ardelle Balls called cardiology to discuss and was told symptoms and results were consistent with rhabdomyalysis.

## 2012-05-27 NOTE — ED Notes (Signed)
Informed pt that she would be going upstair in about 30 minutes.

## 2012-05-27 NOTE — ED Notes (Signed)
Pt is still in xray dept.

## 2012-05-27 NOTE — ED Notes (Signed)
CRITICAL VALUE ALERT  Critical value received: trop 0.32  Date of notification:  05/27/12  Time of notification: 1126  Critical value read back:yes  Nurse who received alert: j. Bing Quarry  MD notified (1st page): rancor  Time of first page:1134  MD notified (2nd page):  Time of second page:  Responding MD: rancor  Time MD responded: 1134

## 2012-05-27 NOTE — ED Notes (Signed)
Pt c/o falling several times during the night. Pt usually walks with a walker and is unable to ambulate at this time. Pt is a pt from [facility]. Pt also c/o abd pain x 3 months.

## 2012-05-27 NOTE — Progress Notes (Signed)
Patient received from ED,report given by [facility]. No IV access.blood noted to left arm,apparently were IV was,patient pulled out IV in route to floor. Patient transferred to bed in room 329,also placed on telemetry. Bed alarm set in room,room is viewed by video observation to promote safety.Patient has had multiple falls. Vital signs stable.

## 2012-05-27 NOTE — Progress Notes (Signed)
Cardiac panel results called to Dr,Dondiego,total CK-3002,CK-MB,24.8,Troponin 40.8,Relative index 0.8.Will continue to  monitor labs,and patient.

## 2012-05-28 LAB — CARDIAC PANEL(CRET KIN+CKTOT+MB+TROPI)
CK, MB: 15.1 ng/mL (ref 0.3–4.0)
Relative Index: 0.7 (ref 0.0–2.5)
Troponin I: <0.3 ng/mL (ref <0.30)

## 2012-05-28 NOTE — Progress Notes (Signed)
UR Chart Review Completed  

## 2012-05-28 NOTE — Clinical Social Work Psychosocial (Signed)
Clinical Social Work Department BRIEF PSYCHOSOCIAL ASSESSMENT 05/28/2012  Patient:  [name], [name]     Account Number:  [id number]     Admit date:  05/27/2012  Clinical Social Worker:  Nancie Neas  Date/Time:  05/28/2012 09:00 AM  Referred by:  Physician  Date Referred:  05/28/2012 Referred for  ALF Placement   Other Referral:   Interview type:  Patient Other interview type:   and daughter- Christine    PSYCHOSOCIAL DATA Living Status:  FACILITY Admitted from facility:  [facility] Level of care:  Assisted Living Primary support name:  Wynona Canes Primary support relationship to patient:  CHILD, ADULT Degree of support available:   very supportive    CURRENT CONCERNS Current Concerns  Post-Acute Placement   Other Concerns:    SOCIAL WORK ASSESSMENT / PLAN CSW met with pt at bedside. Pt alert but pleasantly confused.  CSW spoke with pt's daughter Wynona Canes who reports pt has been a resident at [facility] for several months.  She has several concerns about facility and states she has voiced them to administration there.  Daughter has looked into another facility but there is a wait list. Family is very involved and supportive.  Wynona Canes would like to take pt home with her but is unable because she works second shift. She still requests return back to [facility].  Per Tammy at facility pt is okay to return. She ambulates with supervision and facility encourages use of walker. Pt is confused at baseline and no home health prior to admission.   Assessment/plan status:  Psychosocial Support/Ongoing Assessment of Needs Other assessment/ plan:   Information/referral to community resources:   [facility]  ALF list    PATIENT'S/FAMILY'S RESPONSE TO PLAN OF CARE: Pt unable to discuss plan of care.  [name] has several concerns about [facility] and CSW encouraged her to continue to discuss with them.  Initiating new bed search was discussed, but Wynona Canes  states she wants pt to return to [facility] and she will look into new facilities on her own.  ALF list provided and left in pt's room for daughter. CSW to continue to follow.        Derenda Fennel, [place] [phone]

## 2012-05-28 NOTE — Progress Notes (Signed)
Brief Nutrition Note  Patient identified on the Nutrition Risk Report for Dysphagia and wt loss. She is unable to provide UBW and appetite is very good currently. No family present.   Body mass index is 28.00 kg/(m^2). Pt meets criteria for overweight based on current BMI.   Current diet order is Regular with thin liquids, patient is consuming approximately 75-100% of meals at this time. Labs and medications reviewed.   No further nutrition interventions warranted at this time. If additional nutrition issues arise, please re-consult RD.   Royann Shivers MS,RD,LDN Pager: [phone]

## 2012-05-28 NOTE — Progress Notes (Signed)
NAMEMarland [name]  Janet, [name] NO.:  [id number]  MEDICAL RECORD NO.:  [id number]  LOCATION:  A329                          FACILITY:  [facility]  PHYSICIAN:  Carlie Solorzano G. Renard Matter, MD   DATE OF BIRTH:  1930/03/14  DATE OF PROCEDURE: DATE OF DISCHARGE:                                PROGRESS NOTE   This patient was admitted from nursing facility after having taken a fall last evening.  He was complaining of back pain and head pain and left elbow pain.  Multiple x-rays were obtained of C-spine and chest, CT of the head, CT of abdomen, no acute abnormality noted.  The patient did have slight elevation of troponin on admission.  Cardiac markers did get worse throughout the evening.  Troponin level went from 0.35-0.40.  CK 1- 3002, and CK-MB 24.8.  Today's numbers show CK 2218 with CK-MB 15.1. Troponin less than 0.30, last evening because of markedly elevated cardiac markers.  We did another EKG which did not show any change, was essentially normal.  Discussions were held with cardiologist on-call followed by, and he stated that this was likely rhabdomyolysis and did not represent an acute event.  PHYSICAL EXAMINATION:  LUNGS:  Clear to P and A. HEART:  Regular rhythm. ABDOMEN:  No palpable organs or masses.  ASSESSMENT:  The patient was admitted following a fall in the nursing facility.  She has abrasion, left elbow and diffuse abdominal tenderness.  She does apparently have rhabdomyolysis.  She does have schizophrenia and dementia and had hypokalemia and this had been repleted.  She continues to have a normal electrocardiogram.     Elisa Kutner G. Renard Matter, MD     AGM/MEDQ  D:  05/28/2012  T:  05/28/2012  Job:  [id number]

## 2012-05-28 NOTE — Care Management Note (Signed)
    Page 1 of 1   05/30/2012     1:00:06 PM   CARE MANAGEMENT NOTE 05/30/2012  Patient:  SHARESE, MANRIQUE   Account Number:  [id number]  Date Initiated:  05/28/2012  Documentation initiated by:  Sharrie Rothman  Subjective/Objective Assessment:   Pt admitted from [facility] with rhabdomyolosis. Pt will return to [facility] at discharge. Pt has used [facility] in the past.     Action/Plan:   Cardiology consult. CSW is aware of pts admission and will arrange discharge to facility when pt is medically stable. Pt may need HH PT at discharge. CM will arrange per MD order.   Anticipated DC Date:  05/30/2012   Anticipated DC Plan:  ASSISTED LIVING / REST HOME  In-house referral  Clinical Social Worker      DC Planning Services  CM consult      Choice offered to / List presented to:             Status of service:  Completed, signed off Medicare Important Message given?   (If response is "NO", the following Medicare IM given date fields will be blank) Date Medicare IM given:   Date Additional Medicare IM given:    Discharge Disposition:  ASSISTED LIVING  Per UR Regulation:    If discussed at Long Length of Stay Meetings, dates discussed:    Comments:  05/30/12 1300 Arlyss Queen, RN BSN CM Pt discharged back to [facility]. CSW will arrange discharge to facility.  05/28/12 1308 Arlyss Queen, RN BSN CM

## 2012-05-29 MED ORDER — CLONIDINE HCL 0.1 MG PO TABS
0.1000 mg | ORAL_TABLET | Freq: Once | ORAL | Status: AC
  Administered 2012-05-29: 0.1 mg via ORAL
  Filled 2012-05-29: qty 1

## 2012-05-29 MED ORDER — LISINOPRIL 10 MG PO TABS
20.0000 mg | ORAL_TABLET | Freq: Every day | ORAL | Status: DC
  Administered 2012-05-30: 20 mg via ORAL
  Filled 2012-05-29: qty 2

## 2012-05-29 MED ORDER — HALOPERIDOL DECANOATE 100 MG/ML IM SOLN
100.0000 mg | Freq: Once | INTRAMUSCULAR | Status: DC
  Filled 2012-05-29: qty 1

## 2012-05-29 MED ORDER — POTASSIUM CHLORIDE 10 MEQ/100ML IV SOLN
10.0000 meq | INTRAVENOUS | Status: AC
  Administered 2012-05-29 (×3): 10 meq via INTRAVENOUS
  Filled 2012-05-29 (×3): qty 100

## 2012-05-29 NOTE — Progress Notes (Signed)
More confused tonight and attempting to get out of bed even after toileting. Gave ativan earlier with no change in condition. No sleep all night. Safety sitter with pt most of night.

## 2012-05-29 NOTE — Progress Notes (Signed)
NAME:  [name], [name] NO.:  [id number]  MEDICAL RECORD NO.:  [id number]  LOCATION:                                 FACILITY:  PHYSICIAN:  Tamotsu Wiederholt G. Renard Matter, MD   DATE OF BIRTH:  03-08-1930  DATE OF PROCEDURE:  05/29/2012 DATE OF DISCHARGE:                                PROGRESS NOTE   This patient was admitted, following falls at nursing facility.  She is alert, but unable to walk very well without help.  She does have low serum potassium level.  OBJECTIVE:  VITAL SIGNS:  Blood pressure 170/80, respirations 20, pulse 81, temp 97.4. HEART:  Regular rhythm. LUNGS:  Clear to P and A. ABDOMEN:  No palpable organs or masses.  The patient's most recent troponin less than 0.30.  She does have a low serum potassium at 3.2.  ASSESSMENT:  The patient admitted following a fall in the nursing facility.  She has abrasion, left elbow and diffuse abdominal tenderness.  She apparently did have rhabdomyolysis.  She does have schizophrenia and dementia and hypokalemia.  PLAN:  To do runs of KCl again today and repeat a BMET.  We will continue with physical therapy.     Shannell Mikkelsen G. Renard Matter, MD     AGM/MEDQ  D:  05/29/2012  T:  05/29/2012  Job:  [id number]

## 2012-05-30 LAB — BASIC METABOLIC PANEL
BUN: 11 mg/dL (ref 6–23)
CO2: 27 mEq/L (ref 19–32)
Chloride: 104 mEq/L (ref 96–112)
Glucose, Bld: 117 mg/dL — ABNORMAL HIGH (ref 70–99)
Potassium: 4 mEq/L (ref 3.5–5.1)

## 2012-05-30 MED ORDER — SODIUM CHLORIDE 0.9 % IJ SOLN
INTRAMUSCULAR | Status: AC
  Filled 2012-05-30: qty 3

## 2012-05-30 NOTE — Progress Notes (Signed)
Pt was seen yesterday for an evaluation and found to be at functional baseline.  She is a high fall risk due to her dementia and needs close supervision at rest home.

## 2012-05-30 NOTE — Discharge Summary (Signed)
NAMEMarland [name]  TRENACE, COUGHLIN NO.:  [id number]  MEDICAL RECORD NO.:  [id number]  LOCATION:  A329                          FACILITY:  [facility]  PHYSICIAN:  Nashay Brickley G. Renard Matter, MD   DATE OF BIRTH:  10-Oct-1930  DATE OF ADMISSION:  05/27/2012 DATE OF DISCHARGE:  LH                              DISCHARGE SUMMARY   ADDENDUM  This patient will be discharged on the following medications: 1. Lisinopril 10 mg daily. 2. Meloxicam 15 mg daily. 3. Haloperidol 5 mg at bedtime. 4. Haloperidol decanoate 50 mg injected in muscles every 28 days at     mental health. 5. Zyprexa 15 mg daily 6. Lorazepam 1 mg every 4 hours for agitation.  The patient was stable at the time of her discharge.     Danyetta Gillham G. Renard Matter, MD     AGM/MEDQ  D:  05/30/2012  T:  05/30/2012  Job:  [id number]

## 2012-05-30 NOTE — Clinical Social Work Note (Signed)
Pt d/c today by MD back to [facility]. Pt pleasantly confused. Daughter and facility aware and agreeable. Pt to transfer via facility van. D/C summary and FL2 faxed.    Derenda Fennel, [place] [phone]

## 2012-05-30 NOTE — Progress Notes (Signed)
Patient's blood pressure was 170/95. Patient was not agitated and was not in any pain. Doctor was notified and new orders were given to give 0.1mg  of clonidine once and to change patient's lisinopril to 20mg  daily. Will continue to monitor patient.

## 2012-05-30 NOTE — Progress Notes (Signed)
Patient's daughter Camya Haydon was notified to tell her that her mom was being discharged today back to [facility]. She stated she would bring clothes before noon.

## 2012-05-30 NOTE — Discharge Summary (Signed)
NAMEMarland Kitchen  [name], [name] NO.:  [id number]  MEDICAL RECORD NO.:  [id number]  LOCATION:  A329                          FACILITY:  [facility]  PHYSICIAN:  Giannis Corpuz G. Renard Matter, MD   DATE OF BIRTH:  [birth year]  DATE OF ADMISSION:  05/27/2012 DATE OF DISCHARGE:  LH                              DISCHARGE SUMMARY   This patient was admitted through the emergency department following falls at local nursing facility.  She was brought to the emergency department by EMS personnel.  Apparently, accident occurred some 6-12 hours prior to being seen and it was noted that she had headache and pain in left elbow when being seen by emergency room physician.  She also had slightly abnormal cardiac markers.  It was felt she should be admitted to rule out coronary artery disease, and for further evaluation.  The patient does have dementia.  She has schizophrenic affective disorder as well.  PHYSICAL EXAMINATION:  GENERAL:  On admission, alert female with blood pressure 136/64, respirations 18, pulse 92, temp 97.6. HEENT:  Eyes PERRLA.  TM negative.  Oropharynx benign.  NECK:  Supple. No JVD or thyroid abnormalities. HEART:  Regular rhythm.  No murmur.  No cardiomegaly.  LUNGS:  Clear to P and A. ABDOMEN:  Diffuse tenderness.  She has no organomegaly. NEUROLOGICAL:  No cranial nerve abnormalities.  No motor or sensory disturbance.  Reflexes equal bilaterally.  LABORATORY DATA:  CBC on admission, WBC 7.9, hemoglobin 12.1, hematocrit 34.4.  Cardiac profile on admission, CK-MB 10.9, CK total 1649, troponin less than 0.30.  Other chemistries on admission, sodium 141, potassium 3.2, chloride 107, CO2 of 26, BUN 16, creatinine 0.67, calcium 9.7.  GFR less than 90.  Urinalysis is negative.  Subsequent cardiac markers on May 27, 2012, total CK 3002, CK-MB 24.8, troponin 0.4.  Subsequent cardiac markers was 716.  Total CK is 2218, CK-MB 15.1, troponin less than 0.3 and subsequently CK 1649, CK-MB  10.9, troponin less than 0.30. X-rays, chest x-ray on admission, no acute abnormalities.  CT of the abdomen, no acute intracranial abdominal or intrapelvic abnormalities. CT angio of chest, no evidence of pulmonary embolism, normal heart size with evidence of left ventricular hypertrophy, minimal linear scar or atelectasis in the lingula and right middle lobe.  CT of cervical spine, no acute intracranial abnormality.  Cervical spine, negative for fracture.  There is degenerative changes at C1-C2, C3-C4, C5-C6.  CT of the head, otherwise negative.  HOSPITAL COURSE:  The patient at the time of her admission was placed on bedrest on a telemetry bed.  She is continued on haloperidol decanoate 100 mg IM.  She was continued on lisinopril 20 mg daily, meloxicam 15 mg daily, olanzapine 15 mg at bedtime, haloperidol 5 mg at bedtime.  She did receive DVT prophylaxis with Lovenox 40 mg subcutaneously daily. EKG on 2 occasions showed no acute changes.  A discussion was held with the cardiologist in [place], with [facility] on day of admission reference to the markedly elevated cardiac markers.  Review of her EKG as well which showed no acute changes.  We felt that this was due to rhabdomyolysis and that no further cardiac workup  was needed.  The patient was gradually ambulated and she did have some back pain and did have a low serum potassium for which she received runs of KCl.  It was felt she could be discharged after 3 days hospitalization.     Siddh Vandeventer G. Renard Matter, MD     AGM/MEDQ  D:  05/30/2012  T:  05/30/2012  Job:  [id number]

## 2012-05-30 NOTE — Progress Notes (Signed)
Pt discharged back to skilled nursing facility via company transportation. All documentation sent with staff member. Pt currently voiced no c/o pain or discomfort.

## 2012-06-01 ENCOUNTER — Encounter (HOSPITAL_COMMUNITY): Payer: Self-pay | Admitting: *Deleted

## 2012-06-01 ENCOUNTER — Emergency Department (HOSPITAL_COMMUNITY): Payer: Medicare Other

## 2012-06-01 ENCOUNTER — Inpatient Hospital Stay (HOSPITAL_COMMUNITY)
Admission: EM | Admit: 2012-06-01 | Discharge: 2012-06-06 | DRG: 948 | Disposition: A | Discharge status: Skilled Nursing Facility | Payer: Medicare Other | Attending: Family Medicine | Admitting: Family Medicine

## 2012-06-01 DIAGNOSIS — T424X5A Adverse effect of benzodiazepines, initial encounter: Secondary | ICD-10-CM | POA: Diagnosis present

## 2012-06-01 DIAGNOSIS — E7800 Pure hypercholesterolemia, unspecified: Secondary | ICD-10-CM | POA: Diagnosis present

## 2012-06-01 DIAGNOSIS — T434X5A Adverse effect of butyrophenone and thiothixene neuroleptics, initial encounter: Secondary | ICD-10-CM | POA: Diagnosis present

## 2012-06-01 DIAGNOSIS — Z79899 Other long term (current) drug therapy: Secondary | ICD-10-CM

## 2012-06-01 DIAGNOSIS — F259 Schizoaffective disorder, unspecified: Secondary | ICD-10-CM | POA: Diagnosis present

## 2012-06-01 DIAGNOSIS — F0390 Unspecified dementia without behavioral disturbance: Secondary | ICD-10-CM | POA: Diagnosis present

## 2012-06-01 DIAGNOSIS — I10 Essential (primary) hypertension: Secondary | ICD-10-CM | POA: Diagnosis present

## 2012-06-01 DIAGNOSIS — M79609 Pain in unspecified limb: Secondary | ICD-10-CM | POA: Diagnosis present

## 2012-06-01 DIAGNOSIS — Z9181 History of falling: Secondary | ICD-10-CM

## 2012-06-01 DIAGNOSIS — F29 Unspecified psychosis not due to a substance or known physiological condition: Secondary | ICD-10-CM | POA: Diagnosis present

## 2012-06-01 DIAGNOSIS — R4182 Altered mental status, unspecified: Principal | ICD-10-CM | POA: Diagnosis present

## 2012-06-01 DIAGNOSIS — N390 Urinary tract infection, site not specified: Secondary | ICD-10-CM | POA: Diagnosis present

## 2012-06-01 DIAGNOSIS — R296 Repeated falls: Secondary | ICD-10-CM

## 2012-06-01 DIAGNOSIS — G8929 Other chronic pain: Secondary | ICD-10-CM | POA: Diagnosis present

## 2012-06-01 LAB — CBC WITH DIFFERENTIAL/PLATELET
Eosinophils Absolute: 0.2 10*3/uL (ref 0.0–0.7)
Hemoglobin: 11.6 g/dL — ABNORMAL LOW (ref 12.0–15.0)
Lymphs Abs: 1.2 10*3/uL (ref 0.7–4.0)
MCH: 32.3 pg (ref 26.0–34.0)
Monocytes Relative: 7 % (ref 3–12)
Neutro Abs: 3.4 10*3/uL (ref 1.7–7.7)
Neutrophils Relative %: 66 % (ref 43–77)
RBC: 3.59 MIL/uL — ABNORMAL LOW (ref 3.87–5.11)

## 2012-06-01 LAB — BASIC METABOLIC PANEL
BUN: 18 mg/dL (ref 6–23)
CO2: 26 mEq/L (ref 19–32)
Chloride: 101 mEq/L (ref 96–112)
GFR calc Af Amer: >90 mL/min (ref >90)
Potassium: 3.6 mEq/L (ref 3.5–5.1)

## 2012-06-01 LAB — URINE MICROSCOPIC-ADD ON

## 2012-06-01 LAB — URINALYSIS, ROUTINE W REFLEX MICROSCOPIC
Bilirubin Urine: NEGATIVE
Glucose, UA: NEGATIVE mg/dL
Nitrite: NEGATIVE
Specific Gravity, Urine: >1.03 — ABNORMAL HIGH (ref 1.005–1.030)
pH: 5.5 (ref 5.0–8.0)

## 2012-06-01 MED ORDER — LORAZEPAM 2 MG/ML IJ SOLN: 1.0000 mg | INTRAMUSCULAR | Status: DC | PRN

## 2012-06-01 MED ORDER — LISINOPRIL 10 MG PO TABS
10.0000 mg | ORAL_TABLET | Freq: Every day | ORAL | Status: DC
  Administered 2012-06-02 – 2012-06-06 (×5): 10 mg via ORAL
  Filled 2012-06-01 (×5): qty 1

## 2012-06-01 MED ORDER — ONDANSETRON HCL 4 MG PO TABS: 4.0000 mg | ORAL_TABLET | Freq: Three times a day (TID) | ORAL | Status: DC | PRN

## 2012-06-01 MED ORDER — MELOXICAM 7.5 MG PO TABS
15.0000 mg | ORAL_TABLET | Freq: Every day | ORAL | Status: DC
  Administered 2012-06-02 – 2012-06-06 (×5): 15 mg via ORAL
  Filled 2012-06-01 (×4): qty 2
  Filled 2012-06-01 (×3): qty 1

## 2012-06-01 MED ORDER — ENOXAPARIN SODIUM 40 MG/0.4ML ~~LOC~~ SOLN
40.0000 mg | SUBCUTANEOUS | Status: DC
  Administered 2012-06-02 – 2012-06-06 (×5): 40 mg via SUBCUTANEOUS
  Filled 2012-06-01 (×5): qty 0.4

## 2012-06-01 MED ORDER — LORAZEPAM 1 MG PO TABS
1.0000 mg | ORAL_TABLET | ORAL | Status: DC | PRN
  Administered 2012-06-01 – 2012-06-05 (×6): 1 mg via ORAL
  Filled 2012-06-01 (×6): qty 1

## 2012-06-01 MED ORDER — ONDANSETRON HCL 4 MG/2ML IJ SOLN: 4.0000 mg | Freq: Three times a day (TID) | INTRAMUSCULAR | Status: AC | PRN

## 2012-06-01 NOTE — ED Notes (Signed)
Meal tray given. PA talking with [employer]. nad

## 2012-06-01 NOTE — ED Notes (Signed)
Bed alarm placed due to pt trying to get out of bed

## 2012-06-01 NOTE — ED Provider Notes (Signed)
History     CSN: [id number]  Arrival date & time 06/01/12  4098   First MD Initiated Contact with Patient 06/01/12 1003      Chief Complaint  Patient presents with  . Altered Mental Status    (Consider location/radiation/quality/duration/timing/severity/associated sxs/prior treatment) HPI Comments: [name] presents with decreased alertness,  Increased confusion and multiple falls.  She fell while ambulating on 05/27/12,  The day she was discharged to her assisted living facility during which time she was treated for a uti and rhabdomyolysis.  Last night she sustained 4 more falls,  Both falling from her bed and with ambulation.  Nursing staff has noticed she is walking with tiny shuffling steps since she has been back in their facility and seems to be unsteady on her feet.  She does have dementia at baseline,  But is normally alert,  Active but pleasantly confused.  She has been drowsier than normal.  She is on several sedating medications including haldol and ativan,  But she has had no changes in the dosage of these medicines and has been on them for at least several months,  Probably longer.    The history is provided by the nursing home and a relative.    Past Medical History  Diagnosis Date  . Chronic leg pain   . Poor historian   . Psychosis   . Schizoaffective disorder   . Hypertension   . High cholesterol   . Dementia     Past Surgical History  Procedure Date  . Tubal ligation   . Foot surgery     History reviewed. No pertinent family history.  History  Substance Use Topics  . Smoking status: Never Smoker   . Smokeless tobacco: Not on file  . Alcohol Use: No    OB History    Grav Para Term Preterm Abortions TAB SAB Ect Mult Living                  Review of Systems  Unable to perform ROS Pt is nto cooperative and not reliable historian.  Allergies  Review of patient's allergies indicates no known allergies.  Home Medications   Current  Outpatient Rx  Name Route Sig Dispense Refill  . HALOPERIDOL 5 MG PO TABS Oral Take 5 mg by mouth at bedtime.    Marland Kitchen HALOPERIDOL DECANOATE 100 MG/ML IM SOLN Intramuscular Inject 50 mg into the muscle every 28 (twenty-eight) days. Given at Mental Health    . LISINOPRIL 10 MG PO TABS Oral Take 10 mg by mouth daily.    Marland Kitchen LORAZEPAM 1 MG PO TABS Oral Take 1 mg by mouth every 4 (four) hours as needed. Agitation, DO NOT GIVE AT BEDTIME    . MELOXICAM 15 MG PO TABS Oral Take 15 mg by mouth daily.    Marland Kitchen OLANZAPINE 15 MG PO TABS Oral Take 15 mg by mouth at bedtime.    . TRAMADOL HCL 50 MG PO TABS Oral Take 50 mg by mouth every 4 (four) hours as needed. FOR PAIN      BP 138/87  Pulse 101  Temp 97.3 F (36.3 C) (Oral)  Resp 18  Wt 163 lb (73.936 kg)  SpO2 98%  Physical Exam  Nursing note and vitals reviewed. Constitutional: She appears well-developed and well-nourished. No distress.       Pt somnolent but easily wakes and responds to touch or voice.  HENT:  Head: Normocephalic and atraumatic.  Eyes: Conjunctivae are normal.  Neck: Normal range of motion.  Cardiovascular: Normal rate, normal heart sounds and intact distal pulses.   Pulmonary/Chest: Effort normal and breath sounds normal. No respiratory distress. She has no wheezes.  Abdominal: Soft. Bowel sounds are normal. She exhibits no distension. There is no tenderness.  Musculoskeletal: Normal range of motion.  Neurological: She is disoriented. No sensory deficit.       Unable to assess meaningful neuro exam - patient does not consistently follow commands.  No obvious focal deficits.    Skin: Skin is warm and dry.    ED Course  Procedures (including critical care time)  Labs Reviewed  URINALYSIS, ROUTINE W REFLEX MICROSCOPIC - Abnormal; Notable for the following:    Specific Gravity, Urine >1.030 (*)     Hgb urine dipstick TRACE (*)     Leukocytes, UA TRACE (*)     All other components within normal limits  BASIC METABOLIC PANEL -  Abnormal; Notable for the following:    GFR calc non Af Amer 82 (*)     All other components within normal limits  CK - Abnormal; Notable for the following:    Total CK 244 (*)     All other components within normal limits  CBC WITH DIFFERENTIAL - Abnormal; Notable for the following:    RBC 3.59 (*)     Hemoglobin 11.6 (*)     HCT 33.1 (*)     Platelets 136 (*)     All other components within normal limits  URINE MICROSCOPIC-ADD ON - Abnormal; Notable for the following:    Squamous Epithelial / LPF FEW (*)     Bacteria, UA FEW (*)     All other components within normal limits  TROPONIN I  URINE CULTURE   Ct Head Wo Contrast  06/01/2012  *RADIOLOGY REPORT*  Clinical Data: Altered level of consciousness, altered mental status  CT HEAD WITHOUT CONTRAST  Technique:  Contiguous axial images were obtained from the base of the skull through the vertex without contrast.  Comparison: 05/27/2012  Findings: Age-related brain atrophy evident.  No acute intracranial hemorrhage, mass lesion, infarction, midline shift, herniation, hydrocephalus, or extra-axial fluid collection.  Cisterns patent. No cerebellar abnormality.  Symmetric orbits.  Mastoids and sinuses clear.  Nonspecific calvarial thickening noted.  IMPRESSION: Stable age related atrophy.  No acute intracranial finding.  Original Report Authenticated By: Judie Petit. Ruel Favors, M.D.     1. Altered mental state   2. Frequent falls       MDM  Patient with alert mental status,  Unsteady gait with increased falls.  Discussed with family at bedside who feel pt needs higher level of care.  Also discussed with Dr. Clarene Duke who agrees with plan for admission.  Call placed to Dr. Felecia Shelling who will see pt and admit.  Requested temp admission orders to telemetry.        Burgess Amor, [place] 06/01/12 1635

## 2012-06-01 NOTE — ED Notes (Signed)
Dr Felecia Shelling in to see pt

## 2012-06-01 NOTE — H&P (Signed)
History and Physical  [name] YNW:[id number] DOB: 01/22/1930 DOA: 06/01/2012 PCP: No primary provider on file.   Chief Complaint:   Change in mental status and recurrent fall  HPI:  This is an 18 yrs female patient with history of multiple medical illnesses brought to ER due to the above complaints. She was recently discharged from  this hospital after she was treated for uti and Rhabdomyolysis. Patient has been falling frequently and she is also confused and  Disoriented. Her family is worried and they think need higher level of care  Review of Systems:  Patient is confused and disoriented and unable to give history  Past Medical History  Diagnosis Date  . Chronic leg pain   . Poor historian   . Psychosis   . Schizoaffective disorder   . Hypertension   . High cholesterol   . Dementia    Past Surgical History  Procedure Date  . Tubal ligation   . Foot surgery    Social History:  reports that she has never smoked. She does not have any smokeless tobacco history on file. She reports that she does not drink alcohol or use illicit drugs.   No Known Allergies  History reviewed. No pertinent family history.   Prior to Admission medications   Medication Sig Start Date End Date Taking? Authorizing Provider  haloperidol (HALDOL) 5 MG tablet Take 5 mg by mouth at bedtime.   Yes Historical Provider, MD  haloperidol decanoate (HALDOL DECANOATE) 100 MG/ML injection Inject 50 mg into the muscle every 28 (twenty-eight) days. Given at Mental Health   Yes Historical Provider, MD  lisinopril (PRINIVIL,ZESTRIL) 10 MG tablet Take 10 mg by mouth daily.   Yes Historical Provider, MD  LORazepam (ATIVAN) 1 MG tablet Take 1 mg by mouth every 4 (four) hours as needed. Agitation, DO NOT GIVE AT BEDTIME   Yes Historical Provider, MD  meloxicam (MOBIC) 15 MG tablet Take 15 mg by mouth daily.   Yes Historical Provider, MD  OLANZapine (ZYPREXA) 15 MG tablet Take 15 mg by mouth at bedtime.   Yes  Historical Provider, MD  traMADol (ULTRAM) 50 MG tablet Take 50 mg by mouth every 4 (four) hours as needed. FOR PAIN   Yes Historical Provider, MD   Physical Exam: Filed Vitals:   06/01/12 1006 06/01/12 1251 06/01/12 1253 06/01/12 1258  BP: 125/60 150/64 153/73 138/87  Pulse: 63 89 94 101  Temp: 97.3 F (36.3 C)     TempSrc: Oral     Resp: 18     Weight: 73.936 kg (163 lb)     SpO2: 98%        General:  Awake but confused  Eyes: pupils equal and reactiv  Neck: supple  Cardiovascular: S1 & S2 heard  Respiratory: Clear lung field  Abdomen: soft and lax, bowel sound +  Skin: moist, no rash  Psychiatric: confused and disoriented  Neurologic: nofocal deficit  Labs on Admission:  Basic Metabolic Panel:  Lab 06/01/12 6578 05/30/12 0546 05/27/12 1003  NA 135 138 141  K 3.6 4.0 3.2*  CL 101 104 107  CO2 26 27 26   GLUCOSE 93 117* 102*  BUN 18 11 16   CREATININE 0.64 0.65 0.67  CALCIUM 9.3 9.2 9.7  MG -- -- --  PHOS -- -- --   Liver Function Tests:  Lab 05/27/12 1003  AST 46*  ALT 19  ALKPHOS 85  BILITOT 0.5  PROT 7.5  ALBUMIN 3.5    Lab  05/27/12 1003  LIPASE 11  AMYLASE --    Lab 05/27/12 1014  AMMONIA 14   CBC:  Lab 06/01/12 1120 05/27/12 1003  WBC 5.2 7.9  NEUTROABS 3.4 5.8  HGB 11.6* 12.1  HCT 33.1* 34.4*  MCV 92.2 92.0  PLT 136* 149*   Cardiac Enzymes:  Lab 06/01/12 1120 05/28/12 0735 05/28/12 0012 05/27/12 1653 05/27/12 1251  CKTOTAL 244* 1649* 2218* 3002* --  CKMB -- 10.9* 15.1* 24.8* --  CKMBINDEX -- -- -- -- --  TROPONINI <0.30 <0.30 <0.30 0.40* 0.35*    BNP (last 3 results) No results found for this basename: PROBNP:3 in the last 8760 hours CBG: No results found for this basename: GLUCAP:5 in the last 168 hours  Radiological Exams on Admission: Ct Head Wo Contrast  06/01/2012  *RADIOLOGY REPORT*  Clinical Data: Altered level of consciousness, altered mental status  CT HEAD WITHOUT CONTRAST  Technique:  Contiguous axial  images were obtained from the base of the skull through the vertex without contrast.  Comparison: 05/27/2012  Findings: Age-related brain atrophy evident.  No acute intracranial hemorrhage, mass lesion, infarction, midline shift, herniation, hydrocephalus, or extra-axial fluid collection.  Cisterns patent. No cerebellar abnormality.  Symmetric orbits.  Mastoids and sinuses clear.  Nonspecific calvarial thickening noted.  IMPRESSION: Stable age related atrophy.  No acute intracranial finding.  Original Report Authenticated By: Judie Petit. Ruel Favors, M.D.    Assessment/Plan Active Problems:  * No active hospital problems. *  1. Recurrent fall       We will do physical therapy and evaluate for nursing home evaluation 2. Change in mental status probably secondary to multifactorial causes including polypharmacy.       We will continue to monitor under telementry        We will hold psychoactive medications 3. Recurrent UTI      We will repeat urine culture 4. Hypertension      Continue Lisinopril  Toniann Dickerson   06/01/2012, 6:03 PM

## 2012-06-01 NOTE — ED Notes (Signed)
Patient ambulatory to restroom with assistance, family member at bedside.

## 2012-06-01 NOTE — ED Notes (Signed)
Pt has decreased mental status and change in her speech since 3 am this morning. Pt has fallen x 4 since last night. Pt drowsy but opens eyes to stimuli. Pt normally confused. Pt has skin tear on her left elbow.

## 2012-06-02 LAB — URINE CULTURE

## 2012-06-02 NOTE — Consult Note (Signed)
ANTICOAGULATION CONSULT NOTE - Initial Consult  Pharmacy Consult for Lovenox Indication: VTE prophylaxis  No Known Allergies  Patient Measurements: Height: 5\' 4"  (162.6 cm) Weight: 162 lb 7.7 oz (73.7 kg) IBW/kg (Calculated) : 54.7   Vital Signs: Temp: 98.2 F (36.8 C) (07/21 0600) Temp src: Oral (07/21 0600) BP: 162/70 mmHg (07/21 0600) Pulse Rate: 79  (07/21 0600)  Labs:  Basename 06/01/12 1120  HGB 11.6*  HCT 33.1*  PLT 136*  APTT --  LABPROT --  INR --  HEPARINUNFRC --  CREATININE 0.64  CKTOTAL 244*  CKMB --  TROPONINI <0.30    Estimated Creatinine Clearance: 55.2 ml/min (by C-G formula based on Cr of 0.64).   Medical History: Past Medical History  Diagnosis Date  . Chronic leg pain   . Poor historian   . Psychosis   . Schizoaffective disorder   . Hypertension   . High cholesterol   . Dementia     Medications:  Scheduled:    . enoxaparin (LOVENOX) injection  40 mg Subcutaneous Q24H  . lisinopril  10 mg Oral Daily  . meloxicam  15 mg Oral Daily    Assessment: Good renal fxn  Goal of Therapy:  VTE prophylaxis Monitor platelets by anticoagulation protocol: Yes   Plan: Lovenox 40mg  sq q24hrs CBC per protocol  Valrie Hart A 06/02/2012,9:05 AM

## 2012-06-02 NOTE — Social Work (Addendum)
CSW met with patient's son and daughter in law bedside to assess. Patient was asleep at this time, and patient's son reported that patient's other daughter Rose Phi( is the one truly involved with her care and he would prefer for the discussion to be held with her.  Patient's son reported daughter will be here within the hours.  CSW attempted to call daughter and phone went to voicemail.  CSW will try to meet with patient's daughter later, otherwise, assigned social worker will follow-up to complete assessment.  Manson Passey Jiovanny Burdell ANN S , MSW, LCSWA 06/02/2012  2:25 PM [phone]  CSW met with patient's daughter Wynona Canes to discuss disposition and options.  Patient's daughter believes patient is in need of SNF because she is falling so much at the facility she is in at the present time. Patient's daughter reports patient is on the waiting list at [facility] for long term care.  CSW explained to patient's daughter that a PT consult is pending and once that is complete and recommendations have been made, we can move forward with options. Patient's daughter expressed interest in [facility] for short term rehab if PT recommends that.  Although patient's daughter is not happy with mother's current living arrangement, she expressed verbal understanding that if SNF is not recommended, she will be discharged back to facility.   Patient remained asleep for duration of conversation between Clinical research associate and patient's daughter.   Patient's daughter reports she works second shift and leaves home at 2:15pm. If staff is unable to reach her, she   requests that her son and daughter in law be contacted at home at [phone] or on their cells; Smantha Boakye - [cell] [phone]) or Lorenza Chick - [cell] [phone]).  CSW completed FL2 and placed in signature box. H&P and labs pasted to [facility]. [facility] number was recvd from a previous visit.    Manson Passey Madalene Mickler ANN S , MSW, LCSWA 06/02/2012 3:02 PM [phone]

## 2012-06-02 NOTE — Progress Notes (Signed)
Subjective: Patient is confused and dis oriented and unable to give history.  Physical Exam: Blood pressure 162/70, pulse 79, temperature 98.2 F (36.8 C), temperature source Oral, resp. rate 18, height 5\' 4"  (1.626 m), weight 73.7 kg (162 lb 7.7 oz), SpO2 99.00%.  Chest- clear lung field CVS- S1 &S2 heard no murmur or gallop Abdomen- soft and lax, bowel sound ++ Extremities no leg edema   Investigations:  No results found for this or any previous visit (from the past 240 hour(s)).   Basic Metabolic Panel:  Basename 06/01/12 1120  NA 135  K 3.6  CL 101  CO2 26  GLUCOSE 93  BUN 18  CREATININE 0.64  CALCIUM 9.3  MG --  PHOS --   Liver Function Tests: No results found for this basename: AST:2,ALT:2,ALKPHOS:2,BILITOT:2,PROT:2,ALBUMIN:2 in the last 72  hours   CBC:  Basename 06/01/12 1120  WBC 5.2  NEUTROABS 3.4  HGB 11.6*  HCT 33.1*  MCV 92.2  PLT 136*    Ct Head Wo Contrast  06/01/2012  *RADIOLOGY REPORT*  Clinical Data: Altered level of consciousness, altered mental status   CT HEAD WITHOUT CONTRAST  Technique:  Contiguous axial images were obtained from the base of the skull through the vertex without contrast.  Comparison: 05/27/2012  Findings: Age-related brain atrophy evident.  No acute intracranial hemorrhage, mass lesion, infarction, midline shift, herniation, hydrocephalus, or extra-axial fluid collection.  Cisterns patent. No cerebellar abnormality.  Symmetric orbits.  Mastoids and sinuses clear.  Nonspecific calvarial thickening noted.  IMPRESSION: Stable age related atrophy.  No acute intracranial finding.  Original Report Authenticated By: Judie Petit. Ruel Favors, M.D.      Medications: I have reviewed the patient's current medications.  Impression: 1. Change in mental status 2. Recurrent fall 3. Recurrent UTI 4. Advanced dementa Active Problems:  * No active hospital problems. *      Plan: 1. Fall precaution 2. Physical therapy 3. Will consider nursing home placement 4. Continue regular treatment.     LOS: 1 day   [name]   06/02/2012, 10:07 AM

## 2012-06-03 MED ORDER — HYDROCODONE-ACETAMINOPHEN 5-325 MG PO TABS
1.0000 | ORAL_TABLET | ORAL | Status: DC | PRN
  Administered 2012-06-05 – 2012-06-06 (×2): 1 via ORAL
  Filled 2012-06-03 (×2): qty 1

## 2012-06-03 NOTE — Clinical Social Work Note (Signed)
PT recommending SNF at d/c. CSW discussed with pt's daughter who is agreeable. SNF list provided and [facility] faxed out. Awaiting bed offers. [facility] request submitted.  Derenda Fennel, [place] [phone]

## 2012-06-03 NOTE — Progress Notes (Signed)
UR Chart Review Completed  

## 2012-06-03 NOTE — Progress Notes (Signed)
NAMEMarland Kitchen  [name], [name]               ACCOUNT NO.:  [id number]  MEDICAL RECORD NO.:  [id number]  LOCATION:  A316                          FACILITY:  [facility]  PHYSICIAN:  Otho Michalik G. Renard Matter, MD   DATE OF BIRTH:  [birth year]  DATE OF PROCEDURE: DATE OF DISCHARGE:                                PROGRESS NOTE   This patient was readmitted to the hospital with change in mental status and recurrent falls.  She had been recently discharged from the hospital after she was treated for UTI and rhabdomyolysis.  Her condition remains stable at present, but she still remains mentally confused.  OBJECTIVE:  VITAL SIGNS:  Blood pressure 140/87, respirations 20, pulse 94, temp 97.9. HEENT.  Eyes PERRLA. NECK:  Supple. HEART:  Regular rhythm. LUNGS:  Clear to P and A. ABDOMEN:  No palpable organs or masses. SKIN:  Essentially normal.  X-RAY:  CT of the head had age related atrophy.  ASSESSMENT AND PLAN:  The patient had recurrent falls.  She does have schizophrenic affective disorder, recurrent urinary tract infection, hypertension.  PLAN:  To continue current regimen.  We will attempt to get the patient placed in higher level of care.     Nayden Czajka G. Renard Matter, MD     AGM/MEDQ  D:  06/03/2012  T:  06/03/2012  Job:  [id number]

## 2012-06-03 NOTE — ED Provider Notes (Signed)
Medical screening examination/treatment/procedure(s) were performed by non-physician practitioner and as supervising physician I was immediately available for consultation/collaboration.   Laray Anger, DO 06/03/12 1628

## 2012-06-03 NOTE — Care Management Note (Signed)
    Page 1 of 1   06/06/2012     8:59:42 AM   CARE MANAGEMENT NOTE 06/06/2012  Patient:  [name], [name]   Account Number:  [id number]  Date Initiated:  06/10/2012  Documentation initiated by:  Sharrie Rothman  Subjective/Objective Assessment:   Pt admitted from [facility] PT recommending higher level of care for pt. Pt continues to have frequent falls at facility.     Action/Plan:   CSW is aware of pts admission and will start bed search for SNF bed for pt.   Anticipated DC Date:  06/04/2012   Anticipated DC Plan:  SKILLED NURSING FACILITY  In-house referral  Clinical Social Worker      DC Planning Services  CM consult      Choice offered to / List presented to:             Status of service:  Completed, signed off Medicare Important Message given?   (If response is "NO", the following Medicare IM given date fields will be blank) Date Medicare IM given:   Date Additional Medicare IM given:    Discharge Disposition:  SKILLED NURSING FACILITY  Per UR Regulation:    If discussed at Long Length of Stay Meetings, dates discussed:    Comments:  06/06/12 0855 Arlyss Queen, RN BSN CM Pt discharged to [facility] in [place]. CSW will arrange discharge to facility.  06/03/12 1328 Arlyss Queen, RN BSN CM

## 2012-06-03 NOTE — Clinical Social Work Placement (Signed)
Clinical Social Work Department CLINICAL SOCIAL WORK PLACEMENT NOTE 06/03/2012  Patient:  [name], [name]  Account Number:  [id number] Admit date:  06/01/2012  Clinical Social Worker:  Derenda Fennel, LCSW  Date/time:  06/03/2012 11:45 AM  Clinical Social Work is seeking post-discharge placement for this patient at the following level of care:   SKILLED NURSING   (*CSW will update this form in Epic as items are completed)   06/03/2012  Patient/family provided with [facility] Department of Clinical Social Work's list of facilities offering this level of care within the geographic area requested by the patient (or if unable, by the patient's family).  06/03/2012  Patient/family informed of their freedom to choose among providers that offer the needed level of care, that participate in Medicare, Medicaid or managed care program needed by the patient, have an available bed and are willing to accept the patient.  06/03/2012  Patient/family informed of [facility]' ownership interest in [facility], as well as of the fact that they are under no obligation to receive care at this facility.  PASARR submitted to EDS on 06/03/2012 PASARR number received from EDS on   FL2 transmitted to all facilities in geographic area requested by pt/family on  06/03/2012 FL2 transmitted to all facilities within larger geographic area on   Patient informed that his/her managed care company has contracts with or will negotiate with  certain facilities, including the following:     Patient/family informed of bed offers received:   Patient chooses bed at  Physician recommends and patient chooses bed at    Patient to be transferred to  on   Patient to be transferred to facility by   The following physician request were entered in Epic:   Additional Comments:  Derenda Fennel, LCSW [phone]

## 2012-06-03 NOTE — Progress Notes (Signed)
Pt transferred to room 214 by sitter, Twanna, in stable condition. Pt's IV site patent and WNL. Report called to Tobi Bastos, Charity fundraiser.

## 2012-06-03 NOTE — Evaluation (Signed)
Physical Therapy Evaluation Patient Details Name: [name] MRN: [id number] DOB: [birth year] Today's Date: 06/03/2012 Time: 4098-1191 PT Time Calculation (min): 38 min  PT Assessment / Plan / Recommendation Clinical Impression  Pt has significantly declined in functional status since last PT visit at prior admission.  She is now impaired in all mobility and was unable to ambulate.  She has a rather flat affect.  She has difficulty following directions and is totally confused.  She is no longer suitable to be in ACLF...recommend a try at SNF to see if prior level of function can be restored.    PT Assessment  Patient needs continued PT services    Follow Up Recommendations  Skilled nursing facility    Barriers to Discharge None      Equipment Recommendations  Defer to next venue    Recommendations for Other Services     Frequency Min 3X/week    Precautions / Restrictions Precautions Precautions: Fall Restrictions Weight Bearing Restrictions: No   Pertinent Vitals/Pain       Mobility  Bed Mobility Bed Mobility: Supine to Sit;Sit to Supine;Scooting to HOB Supine to Sit: 4: Min assist;HOB flat Sit to Supine: 4: Min assist;HOB flat Scooting to HOB: 5: Supervision Transfers Transfers: Sit to Stand;Stand to Sit Sit to Stand: 3: Mod assist;From chair/3-in-1;From bed;With upper extremity assist Stand to Sit: 4: Min assist;To chair/3-in-1;To bed;With upper extremity assist Ambulation/Gait Ambulation/Gait Assistance: Other (comment) (now unable to ambulate) Ambulation/Gait Assistance Details: Attempted gait with a walker.  She stands with hips flexed at about 60 deg., holds feet together and tries to slide feet forward.  She is not able to weight shift in order to advance either leg.  She was able to take two steps from chair to bed with hanc held assist, but this was very precarious. Stairs: No Wheelchair Mobility Wheelchair Mobility: No    Exercises     PT Diagnosis:  Difficulty walking;Generalized weakness  PT Problem List: Decreased strength;Decreased activity tolerance;Decreased balance;Decreased mobility;Decreased cognition;Decreased knowledge of use of DME;Decreased safety awareness PT Treatment Interventions: Gait training;Functional mobility training;Therapeutic activities;Therapeutic exercise;Patient/family education   PT Goals Acute Rehab PT Goals PT Goal Formulation: Patient unable to participate in goal setting Time For Goal Achievement: 06/17/12 Potential to Achieve Goals: Fair Pt will go Supine/Side to Sit: with supervision;with HOB 0 degrees PT Goal: Supine/Side to Sit - Progress: Goal set today Pt will go Sit to Supine/Side: with supervision;with HOB 0 degrees PT Goal: Sit to Supine/Side - Progress: Goal set today Pt will go Sit to Stand: with min assist;with upper extremity assist PT Goal: Sit to Stand - Progress: Goal set today Pt will go Stand to Sit: with supervision;with upper extremity assist PT Goal: Stand to Sit - Progress: Goal set today Pt will Ambulate: 1 - 15 feet;with mod assist;with rolling walker PT Goal: Ambulate - Progress: Goal set today  Visit Information  Last PT Received On: 06/03/12    Subjective Data  Subjective: "I fell out of a tree", when asked how she fell Patient Stated Goal: none stated   Prior Functioning  Home Living Available Help at Discharge: Personal care attendant Type of Home: Assisted living Home Access: Level entry Home Adaptive Equipment: Walker - rolling Prior Function Level of Independence: Needs assistance Able to Take Stairs?: No Driving: No Vocation: Retired Musician: No difficulties    Cognition  Overall Cognitive Status: History of cognitive impairments - further impaired (pt less verbal, more lethargic than last admission) Arousal/Alertness: Child psychotherapist  Level: Disoriented to;Place;Time;Situation Behavior During Session: Lethargic      Extremity/Trunk Assessment Right Lower Extremity Assessment RLE ROM/Strength/Tone: Deficits RLE ROM/Strength/Tone Deficits: 3-/5 RLE Sensation: WFL - Light Touch Left Lower Extremity Assessment LLE ROM/Strength/Tone: Deficits LLE ROM/Strength/Tone Deficits: strength 3-/5 Trunk Assessment Trunk Assessment: Kyphotic   Balance Balance Balance Assessed: Yes Static Sitting Balance Static Sitting - Balance Support: No upper extremity supported Static Sitting - Level of Assistance: 5: Stand by assistance Static Standing Balance Static Standing - Level of Assistance: Not tested (comment)  End of Session PT - End of Session Equipment Utilized During Treatment: Gait belt Activity Tolerance: Patient tolerated treatment well Patient left: in chair;with call bell/phone within reach;with nursing in room (pt has a Comptroller) Nurse Communication: Mobility status  GP     Konrad Penta 06/03/2012, 11:44 AM

## 2012-06-04 LAB — CBC
HCT: 33.9 % — ABNORMAL LOW (ref 36.0–46.0)
Hemoglobin: 11.7 g/dL — ABNORMAL LOW (ref 12.0–15.0)
MCV: 91.9 fL (ref 78.0–100.0)
RBC: 3.69 MIL/uL — ABNORMAL LOW (ref 3.87–5.11)
WBC: 4 10*3/uL (ref 4.0–10.5)

## 2012-06-04 MED ORDER — SODIUM CHLORIDE 0.9 % IV SOLN: 250.0000 mL | INTRAVENOUS | Status: DC | PRN

## 2012-06-04 MED ORDER — SODIUM CHLORIDE 0.9 % IJ SOLN: 3.0000 mL | INTRAMUSCULAR | Status: DC | PRN

## 2012-06-04 MED ORDER — SODIUM CHLORIDE 0.9 % IJ SOLN
INTRAMUSCULAR | Status: AC
  Administered 2012-06-04: 3 mL via INTRAVENOUS
  Filled 2012-06-04: qty 3

## 2012-06-04 MED ORDER — SODIUM CHLORIDE 0.9 % IJ SOLN
3.0000 mL | Freq: Two times a day (BID) | INTRAMUSCULAR | Status: DC
  Administered 2012-06-04 – 2012-06-05 (×4): 3 mL via INTRAVENOUS
  Filled 2012-06-04 (×3): qty 3

## 2012-06-04 NOTE — Progress Notes (Signed)
Physical Therapy Treatment Patient Details Name: [name] MRN: [id number] DOB: 09-Aug-1930 Today's Date: 06/04/2012 Time: 0865-7846 PT Time Calculation (min): 20 min  PT Assessment / Plan / Recommendation Comments on Treatment Session  Pt'c cognitive and physical status in much improved today.  she was mcuh more animated and able to follow directions.  She was able to ambulate with a walker with mod assist.  Very cooperative and shows greater potential!    Follow Up Recommendations       Barriers to Discharge        Equipment Recommendations       Recommendations for Other Services    Frequency     Plan Discharge plan remains appropriate;Frequency remains appropriate    Precautions / Restrictions Restrictions Weight Bearing Restrictions: No   Pertinent Vitals/Pain     Mobility  Transfers Sit to Stand: 4: Min assist;With upper extremity assist;From chair/3-in-1 Stand to Sit: 4: Min assist;To chair/3-in-1 Details for Transfer Assistance: max cues needed for proper hand placement Ambulation/Gait Ambulation/Gait Assistance: 3: Mod assist Ambulation Distance (Feet): 60 Feet Assistive device: Rolling walker Ambulation/Gait Assistance Details: Pt still stands with trunk flexed, but is able to pull more erect when cued.  She was instructed to increase base of support and followed through, remembering to do this with much of subsequent gait.  Turns present a major challenge, failing to advance RLE enough...legs get tangled up with each other. Gait Pattern: Trunk flexed;Narrow base of support Stairs: No Wheelchair Mobility Wheelchair Mobility: No    Exercises General Exercises - Upper Extremity Shoulder Flexion: AROM;Both;10 reps;Seated General Exercises - Lower Extremity Long Arc Quad: AROM;10 reps;Both;Seated Hip ABduction/ADduction: Strengthening;Both;10 reps;Seated Hip Flexion/Marching: Both;10 reps;Seated Other Exercises Other Exercises: trunk flexion stretch x 1    PT Diagnosis:    PT Problem List:   PT Treatment Interventions:     PT Goals Acute Rehab PT Goals Pt will go Sit to Stand: with modified independence PT Goal: Sit to Stand - Progress: Updated due to goal met PT Goal: Stand to Sit - Progress: Progressing toward goal Pt will Ambulate: 51 - 150 feet;with min assist PT Goal: Ambulate - Progress: Updated due to goal met  Visit Information  Last PT Received On: 06/04/12    Subjective Data  Subjective: No c/o   Cognition       Balance     End of Session PT - End of Session Equipment Utilized During Treatment: Gait belt Activity Tolerance: Patient tolerated treatment well Patient left: in chair;with nursing in room;with call bell/phone within reach;with family/visitor present (pt has a Comptroller) Nurse Communication: Mobility status   GP     Myrlene Broker L 06/04/2012, 11:23 AM

## 2012-06-04 NOTE — Clinical Social Work Placement (Signed)
Clinical Social Work Department CLINICAL SOCIAL WORK PLACEMENT NOTE 06/04/2012  Patient:  [name], [name]  Account Number:  [id number] Admit date:  06/01/2012  Clinical Social Worker:  Derenda Fennel, LCSW  Date/time:  06/03/2012 11:45 AM  Clinical Social Work is seeking post-discharge placement for this patient at the following level of care:   SKILLED NURSING   (*CSW will update this form in Epic as items are completed)   06/03/2012  Patient/family provided with [facility] Department of Clinical Social Work's list of facilities offering this level of care within the geographic area requested by the patient (or if unable, by the patient's family).  06/03/2012  Patient/family informed of their freedom to choose among providers that offer the needed level of care, that participate in Medicare, Medicaid or managed care program needed by the patient, have an available bed and are willing to accept the patient.  06/03/2012  Patient/family informed of [facility]' ownership interest in [facility], as well as of the fact that they are under no obligation to receive care at this facility.  PASARR submitted to EDS on 06/03/2012 PASARR number received from EDS on   FL2 transmitted to all facilities in geographic area requested by pt/family on  06/03/2012 FL2 transmitted to all facilities within larger geographic area on   Patient informed that his/her managed care company has contracts with or will negotiate with  certain facilities, including the following:     Patient/family informed of bed offers received:  06/04/2012 Patient chooses bed at [facility] [place] Physician recommends and patient chooses bed at  [facility] [place]  Patient to be transferred to  on   Patient to be transferred to facility by   The following physician request were entered in Epic:   Additional Comments:  Derenda Fennel, LCSW [phone]

## 2012-06-04 NOTE — Progress Notes (Signed)
NAMEMarland Kitchen  OLITA, TAKESHITA               ACCOUNT NO.:  [id number]  MEDICAL RECORD NO.:  [id number]  LOCATION:  A214                          FACILITY:  [facility]  PHYSICIAN:  Yashika Mask G. Renard Matter, MD   DATE OF BIRTH:  16-Feb-1930  DATE OF PROCEDURE: DATE OF DISCHARGE:                                PROGRESS NOTE   SUBJECTIVE:  This patient's condition remains much the same.  She was admitted to the hospital again with a change in mental status and recurrent falls.  She had been discharged previously from the hospital after being treated for UTI and rhabdomyolysis.  Her condition remained stable but she is somewhat confused.  OBJECTIVE:  VITAL SIGNS:  Blood pressure 124/75, respirations 20, pulse 81, and temp 98.6. HEENT:  Eyes PERRLA.  TM negative.  Oropharynx benign. NECK:  Supple.  No JVD or thyroid abnormalities. HEART:  Regular rhythm.  No murmurs. LUNGS:  Clear to P and A. ABDOMEN:  No palpable organs or masses. SKIN:  Essentially normal.  ASSESSMENT:  The patient had recurrent falls in the facility that she had been living in.  She does have schizophrenic affective disorder. Recurrent urinary tract infection and hypertension.  PLAN:  To continue current regimen.  We will get the patient placed in a higher level of care.     Raylynn Hersh G. Renard Matter, MD     AGM/MEDQ  D:  06/04/2012  T:  06/04/2012  Job:  [id number]

## 2012-06-04 NOTE — Clinical Social Work Note (Signed)
CSW presented bed offers to pt's daughter who chooses [facility]. Facility notified. Pasarr reports pt is assigned to [facility] group for review for level 2. MD aware as well as facility. CSW will notify MD when Pasarr number received.   Derenda Fennel, [place] [phone]

## 2012-06-05 LAB — URINE MICROSCOPIC-ADD ON

## 2012-06-05 LAB — URINALYSIS, ROUTINE W REFLEX MICROSCOPIC
Bilirubin Urine: NEGATIVE
Glucose, UA: NEGATIVE mg/dL
Specific Gravity, Urine: 1.025 (ref 1.005–1.030)
Urobilinogen, UA: 0.2 mg/dL (ref 0.0–1.0)
pH: 6 (ref 5.0–8.0)

## 2012-06-05 NOTE — Progress Notes (Signed)
NAMEMarland [name]  AUDYN, DIMERCURIO               ACCOUNT NO.:  [id number]  MEDICAL RECORD NO.:  [id number]  LOCATION:  A214                          FACILITY:  [facility]  PHYSICIAN:  Jerron Niblack G. Renard Matter, MD   DATE OF BIRTH:  1930/01/08  DATE OF PROCEDURE: DATE OF DISCHARGE:                                PROGRESS NOTE   This patient condition remains much the same.  She did have a change in her mental status and recurrent falls prior to admission.  Had been discharged to the hospital here for treatment of urinary tract infection and rhabdomyolysis.  Condition still remains much the same.  OBJECTIVE:  VITAL SIGNS:  Blood pressure 188/98, respirations 20, pulse 95, temp 98.5. HEENT:  Eyes:  PERRLA.  TM negative.  Oropharynx benign. NECK:  Supple.  No JVD or thyroid abnormalities. HEART:  Regular rhythm.  No murmurs. LUNGS:  Clear to P and A. ABDOMEN:  No palpable organs or masses.  ASSESSMENT:  The patient had recurrent falls in facility where she had been living.  She does have schizophrenic affective disorder.  Has a history of recurrent urinary tract infection and hypertension.  PLAN:  To have the patient placed in a higher level of care.  We were notified yesterday by social services at [facility], normal needs to be received prior to her transfer to nursing facility.     [name] G. Renard Matter, MD     AGM/MEDQ  D:  06/05/2012  T:  06/05/2012  Job:  [id number]

## 2012-06-05 NOTE — Clinical Social Work Note (Signed)
Face to face evaluation for [facility] completed yesterday and Pasarr number received. Pt has open claim on Medicare account and unless resolved prior to d/c, [facility] will be unable to accept pt due to their policy. CSW called Marylene Land at [facility] who reports this should not be a problem. [name] notified of issue and will contact lawyer. She is aware that another bed will have to be accepted if it does not come through for [facility]. Medicaid prior approval submitted. CSW attempted to call MD to notify but he had left for the day. Note left on chart for MD. CSW will follow up.  Derenda Fennel, [place] [phone]

## 2012-06-06 MED ORDER — HYDROCODONE-ACETAMINOPHEN 5-325 MG PO TABS: 1.0000 | ORAL_TABLET | ORAL | Status: AC | PRN

## 2012-06-06 NOTE — Discharge Summary (Signed)
NAMEMarland Kitchen  [name], [name]               ACCOUNT NO.:  [id number]  MEDICAL RECORD NO.:  [id number]  LOCATION:  A214                          FACILITY:  [facility]  PHYSICIAN:  Sherl Yzaguirre G. Renard Matter, MD   DATE OF BIRTH:  1930/09/24  DATE OF ADMISSION:  06/01/2012 DATE OF DISCHARGE:  07/25/2013LH                              DISCHARGE SUMMARY   ADDENDUM  The patient was to continue the following medications; 1. Lisinopril 10 mg daily. 2. Meloxicam 15 mg daily. 3. Zyprexa 15 mg daily. 4. Haldol Decanoate 100 mg/mL every 28 days IM. 5. Ativan 1 mg every 4 hours as needed for agitation. 6. Ultram 50 mg every 4 hours as needed for pain.  The patient was discharged in satisfactory condition.     Colston Pyle G. Renard Matter, MD     AGM/MEDQ  D:  06/06/2012  T:  06/06/2012  Job:  [id number]

## 2012-06-06 NOTE — Progress Notes (Signed)
NAMEMarland Kitchen  [name], [name]               ACCOUNT NO.:  [id number]  MEDICAL RECORD NO.:  [id number]  LOCATION:  A214                          FACILITY:  [facility]  PHYSICIAN:  Samyukta Cura G. Renard Matter, MD   DATE OF BIRTH:  01-05-1930  DATE OF PROCEDURE: DATE OF DISCHARGE:                                PROGRESS NOTE   SUBJECTIVE:  This patient condition remains much the same.  She does have schizophrenic affect of this disorder.  She has had previous urinary tract infection.  She has been admitted here again because of change in mental status and recurrent falls, and facility prior to admission.  OBJECTIVE:  VITAL SIGNS:  Blood pressure 121/75, respirations 20, pulse 84, temp 96.9. HEENT:  Eyes:  PERRLA.  TMs negative.  Oropharynx benign. NECK:  Supple.  No JVD or thyroid abnormalities. HEART:  Regular rhythm.  No murmurs. LUNGS:  Clear to P and A. ABDOMEN:  No palpable organs or masses.  ASSESSMENT:  The patient does have schizophrenic affective disorder. Has a prior history of recurrent urinary tract infections and hypertension.  PLAN:  To have the patient placed in a higher level of care when facility is available.     Donise Woodle G. Renard Matter, MD     AGM/MEDQ  D:  06/06/2012  T:  06/06/2012  Job:  [id number]

## 2012-06-06 NOTE — Progress Notes (Signed)
Report called to [place] at [facility] in [place]. No questions. Patient waiting for transportation from [facility].

## 2012-06-06 NOTE — Clinical Social Work Placement (Signed)
Clinical Social Work Department CLINICAL SOCIAL WORK PLACEMENT NOTE 06/06/2012  Patient:  [name], [name]  Account Number:  [id number] Admit date:  06/01/2012  Clinical Social Worker:  Derenda Fennel, LCSW  Date/time:  06/03/2012 11:45 AM  Clinical Social Work is seeking post-discharge placement for this patient at the following level of care:   SKILLED NURSING   (*CSW will update this form in Epic as items are completed)   06/03/2012  Patient/family provided with [facility] Department of Clinical Social Work's list of facilities offering this level of care within the geographic area requested by the patient (or if unable, by the patient's family).  06/03/2012  Patient/family informed of their freedom to choose among providers that offer the needed level of care, that participate in Medicare, Medicaid or managed care program needed by the patient, have an available bed and are willing to accept the patient.  06/03/2012  Patient/family informed of [facility]' ownership interest in [facility], as well as of the fact that they are under no obligation to receive care at this facility.  PASARR submitted to EDS on 06/03/2012 PASARR number received from EDS on 06/05/2012  FL2 transmitted to all facilities in geographic area requested by pt/family on  06/03/2012 FL2 transmitted to all facilities within larger geographic area on   Patient informed that his/her managed care company has contracts with or will negotiate with  certain facilities, including the following:     Patient/family informed of bed offers received:  06/04/2012 Patient chooses bed at [facility] [place] Physician recommends and patient chooses bed at  [facility] [place]  Patient to be transferred to [facility] [place] on  06/06/2012 Patient to be transferred to facility by facility Zenaida Niece  The following physician request were entered in Epic:   Additional Comments:   Derenda Fennel, LCSW [phone]

## 2012-06-06 NOTE — Clinical Social Work Note (Signed)
Pt d/c by MD today to SNF. Daughter accepted bed at [facility] as this facility was willing to work with open Medicare claim. Pt to transfer via facility van. D/C summary faxed. Facility MD will provide script for Ativan. [facility] notified of limited pasarr.   Derenda Fennel, [place] [phone]

## 2012-06-06 NOTE — Progress Notes (Signed)
Patient left facility via transportation from [facility] in [place]. Patient pleasant and stable at time of discharge.

## 2012-06-06 NOTE — Progress Notes (Signed)
UR Chart Review Completed  

## 2012-06-07 NOTE — Discharge Summary (Signed)
NAMEALVIRA, [name]               ACCOUNT NO.:  [id number]  MEDICAL RECORD NO.:  [id number]  LOCATION:                                 FACILITY:  PHYSICIAN:  Janecia Palau G. Renard Matter, MD   DATE OF BIRTH:  12-18-1929  DATE OF ADMISSION:  06/01/2012 DATE OF DISCHARGE:  07/25/2013LH                              DISCHARGE SUMMARY   An 78 year old female admitted June 01, 2012, discharged June 06, 2012, 5 days hospitalization.  DIAGNOSES:  Recurrent falls and altered mental status, schizophrenic affective disorder, recent urinary tract infection, recent rhabdomyolysis, hypertension, dementia.  CONDITION:  Stable and improved at the time of her discharge.  This 78 year old patient had a history of multiple medical illnesses who was brought into the emergency department because of change in mental status and local rest home and recurrent falls.  She had been recently discharged from the hospital at that time, was treated for urinary tract infection and rhabdomyolysis which turned out to be secondary to musculoskeletal trauma and not to any cardiac problem.  She had been continuing to fall frequently and become disoriented and confused.  She does have a history of schizophrenic affective disorder and dementia. The family was worried and they thought she needed a higher level of care.  She was, therefore, readmitted to the hospital.  PHYSICAL EXAMINATION:  VITAL SIGNS:  Blood pressure 125/60, pulse 63, respirations 18, temp 98. GENERAL:  The patient was awake, but mentally confused. HEENT:  Eyes, PERRLA.  TMs negative.  Oropharynx benign. NECK:  Supple.  No JVD or thyroid abnormalities. LUNGS:  Clear to P and A. HEART:  Regular rhythm. ABDOMEN:  No palpable organs or masses.  No organomegaly. SKIN:  Warm and dry. NEUROLOGIC:  No motor or sensory abnormality.  Cranial nerves intact. Reflexes equal and normal.  PSYCHIATRIC:  The patient was confused and disoriented at the time of her  admission.  LABORATORY DATA:  Chemistries on admission; sodium 135, potassium 3.6, chloride 101, CO2 26, glucose 93, BUN 18, creatinine 0.64, calcium 9.9. CK on June 01, 2012, 244.  Troponin less than 0.30.  CBC and WBC 4000, hemoglobin 11.7, hematocrit 33.9.  HOSPITAL COURSE:  The patient was started on intravenous fluids at the time of her admission.  She was continued on the following medications; lisinopril 10 mg daily, meloxicam 15 mg daily, subcutaneous Lovenox for DVT prophylaxis 40 mg daily.  She was given the following medications on a p.r.n. basis; Zofran 4 mg every 8 hours p.r.n. for nausea, Ativan 1 mg IV q.4 h. p.r.n. for agitation, hydrocodone acetaminophen tablets 5/325, every 4 hours p.r.n. for pain.  The patient remained relatively stable throughout her hospital stay.  It was obvious that she would need a higher level of care.  Social Service started working with her shortly after admission in order to obtain adequate level of care for her.  Several nursing homes declined her admission.  She had to have a face-to-face evaluation for PASARR completed.  This was done on June 05, 2012, and she had acceptable bed in [facility].  She was discharged in stable condition.     Velita Quirk G. Renard Matter, MD  AGM/MEDQ  D:  06/06/2012  T:  06/06/2012  Job:  [id number]

## 2013-08-29 IMAGING — CT CT ABD-PELV W/ CM
2 of 4 series · 17 of 46 positions shown, 19 images · IV contrast (Omnipaque 300)
Comparison: None.

CLINICAL DATA: Weakness, fall, abdominal pain

CT ABDOMEN AND PELVIS WITH CONTRAST
TECHNIQUE: Multidetector CT imaging of the abdomen and pelvis was
performed following the standard protocol during bolus
administration of intravenous contrast. Sagittal and coronal MPR
images reconstructed from axial data set.
Contrast: 100mL OMNIPAQUE IOHEXOL 300 MG/ML  SOLN. No oral
contrast.

[Series 2: abd_pel_with 5.0 b40f · axial · 0.75mm/px · z∈[+538,+928]mm · 14 of 88 slices shown, 16 images]
[im 5/88  soft-tissue]
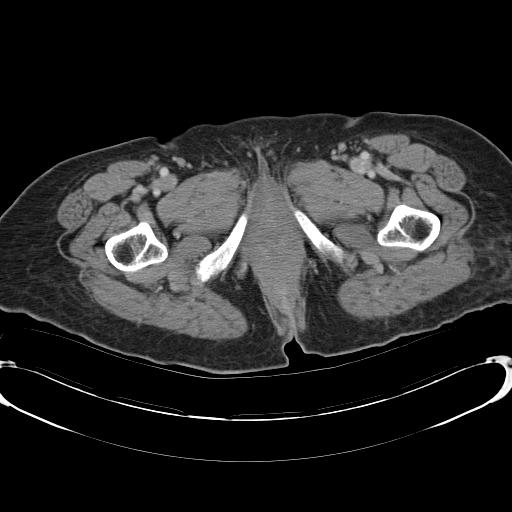
[im 5/88  bone]
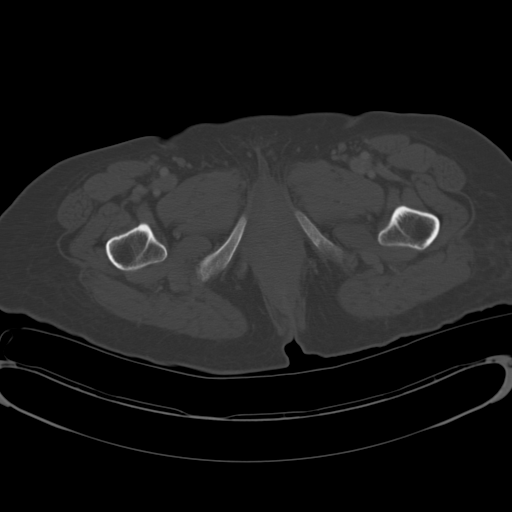
[im 10/88  soft-tissue]
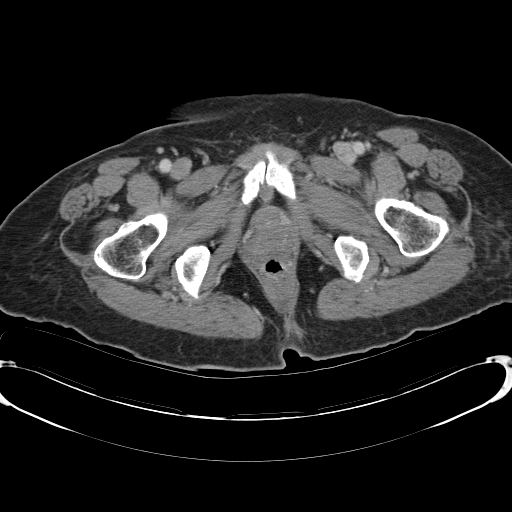
[im 19/88  soft-tissue]
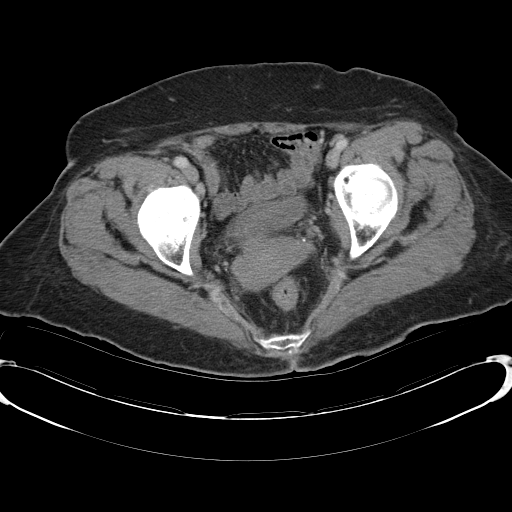
[im 23/88  soft-tissue]
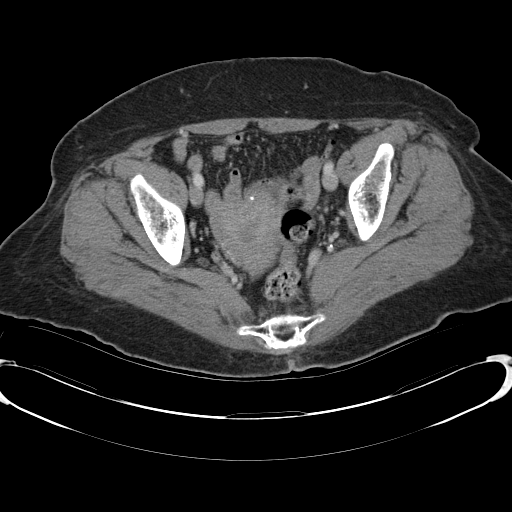
[im 28/88  soft-tissue]
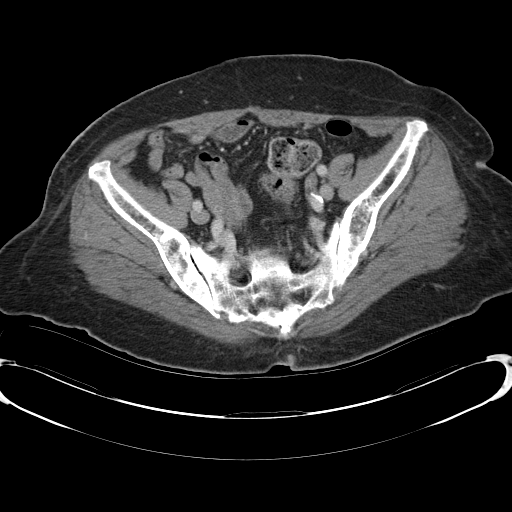
[im 37/88  soft-tissue]
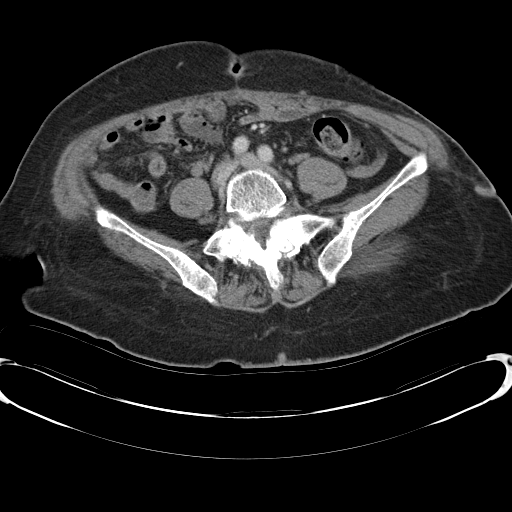
[im 42/88  soft-tissue]
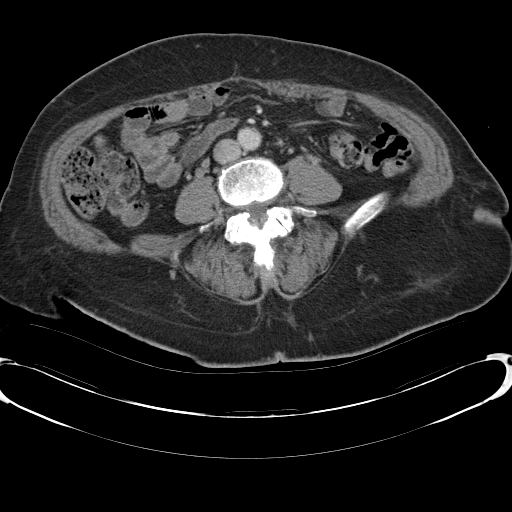
[im 46/88  soft-tissue]
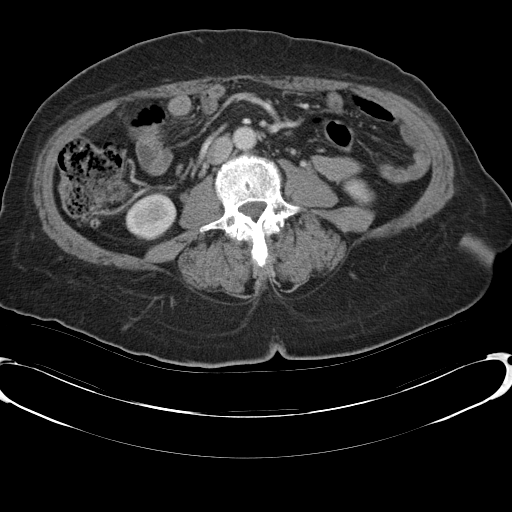
[im 51/88  soft-tissue]
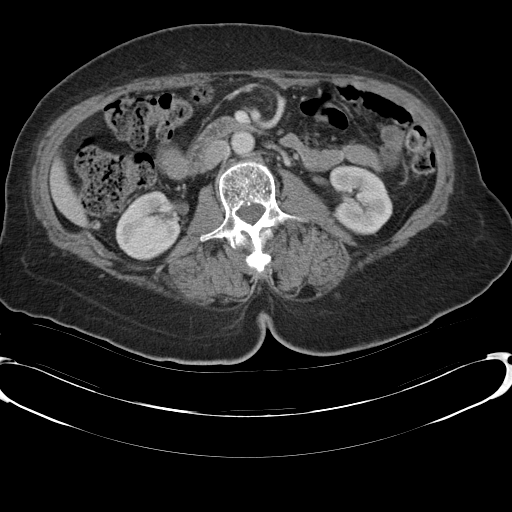
[im 51/88  bone]
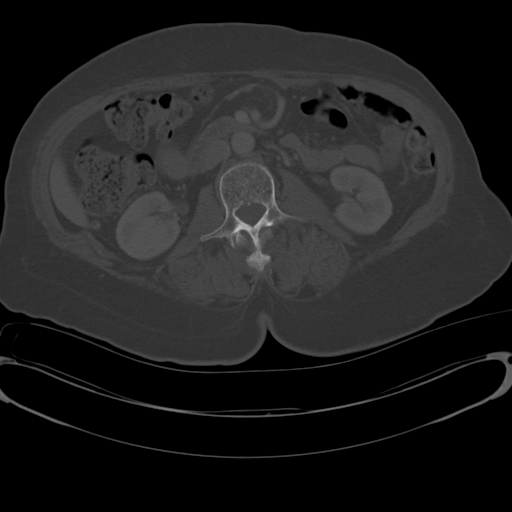
[im 60/88  soft-tissue]
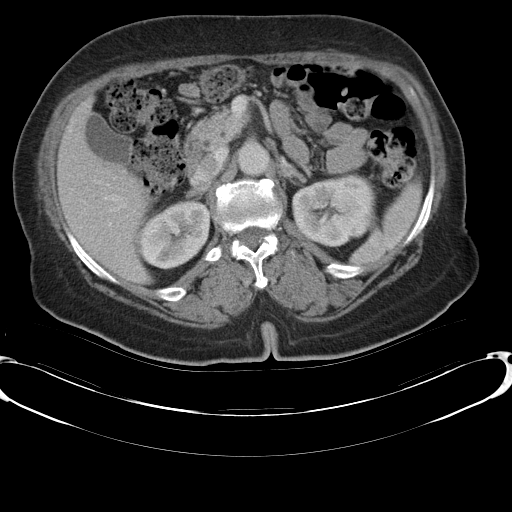
[im 65/88  soft-tissue]
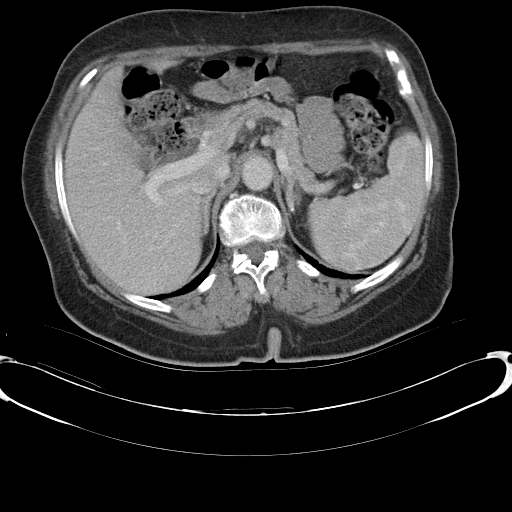
[im 69/88  soft-tissue]
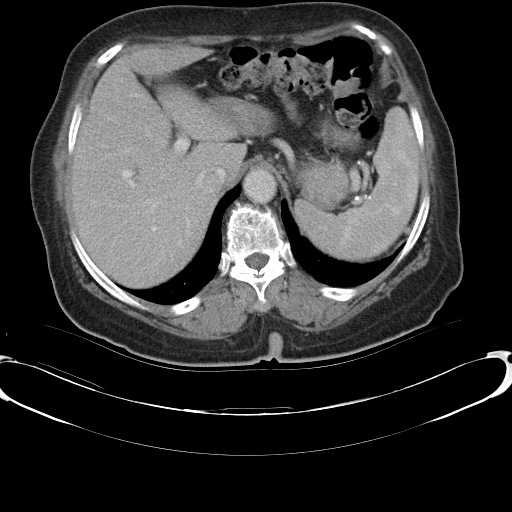
[im 78/88  soft-tissue]
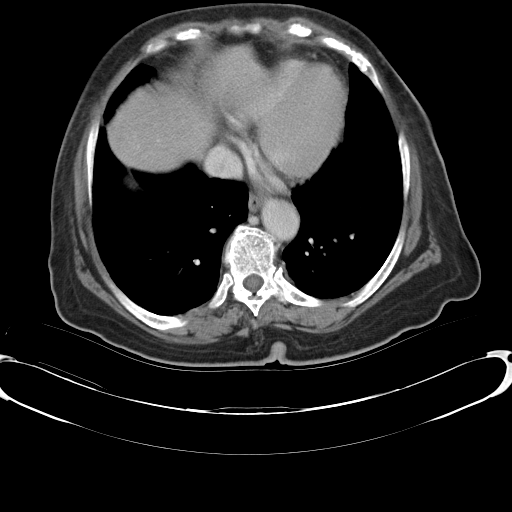
[im 83/88  soft-tissue]
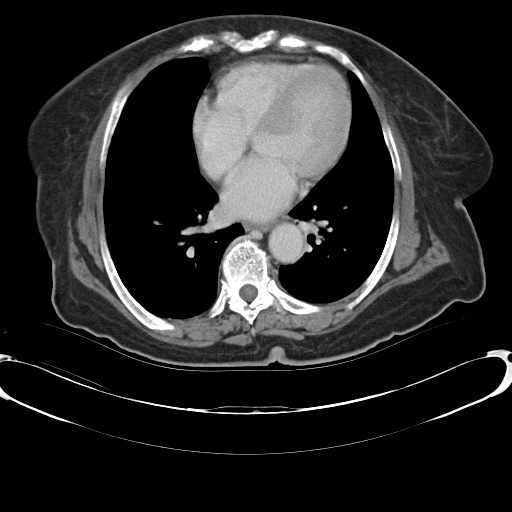

[Series 4: abd_pel_with 3.0 spo cor · coronal · 0.74mm/px · 3 of 91 slices shown]
[im 31/91  soft-tissue]
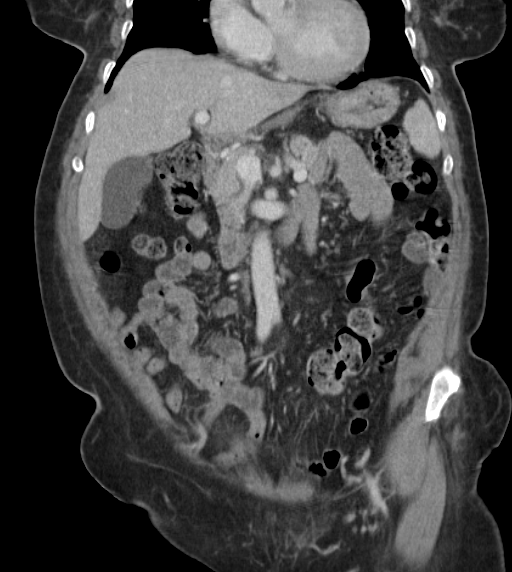
[im 41/91  soft-tissue]
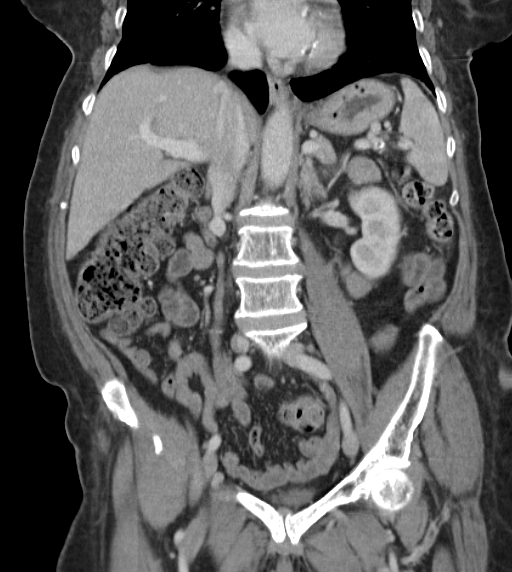
[im 51/91  soft-tissue]
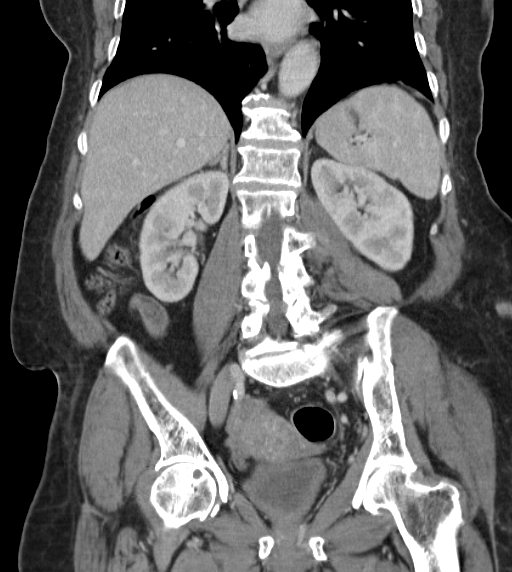

[17 of 46 positions shown; findings below may reference images not displayed]

FINDINGS: Dependent atelectasis of bilateral lung bases.
Tiny cyst right lobe liver.
Liver, spleen, pancreas, kidneys, and adrenal glands normal
appearance.
Normal appendix.
Stomach and bowel loops suboptimally visualized due to lack of GI
contrast, no gross abnormalities seen.
Normal caliber aorta with minimal scattered atherosclerotic
calcification.
Decompressed bladder.
Uterine calcification question degenerated leiomyoma.
No mass, adenopathy, free fluid or inflammatory process.
Bones appear diffusely demineralized with bilateral hip joint
degenerative changes and scattered degenerative disc / facet
disease changes of the thoracolumbar spine.
IMPRESSION: No acute intra abdominal or intrapelvic abnormalities.

## 2013-08-29 IMAGING — CT CT CERVICAL SPINE W/O CM
3 of 5 series · 12 of 33 positions shown, 14 images · non-contrast
Comparison: 02/25/2012

CT HEAD

CLINICAL DATA: Fall

CT HEAD WITHOUT CONTRAST
CT CERVICAL SPINE WITHOUT CONTRAST
TECHNIQUE: Multidetector CT imaging of the head and cervical spine
was performed following the standard protocol without intravenous
contrast.  Multiplanar CT image reconstructions of the cervical
spine were also generated.

[Series 5: cervical st 2.0 b31s · axial · 0.27mm/px · z∈[+1078,+1178]mm · 4 of 84 slices shown, 5 images]
[im 17/84  soft-tissue]
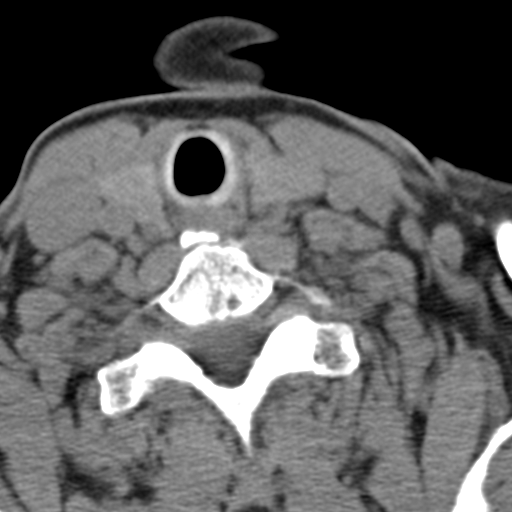
[im 17/84  bone]
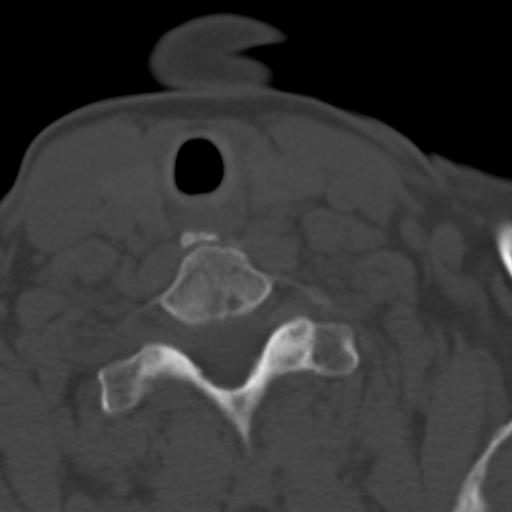
[im 34/84  bone]
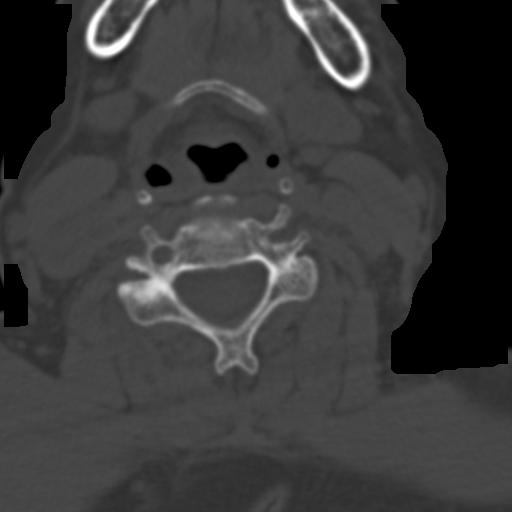
[im 50/84  bone]
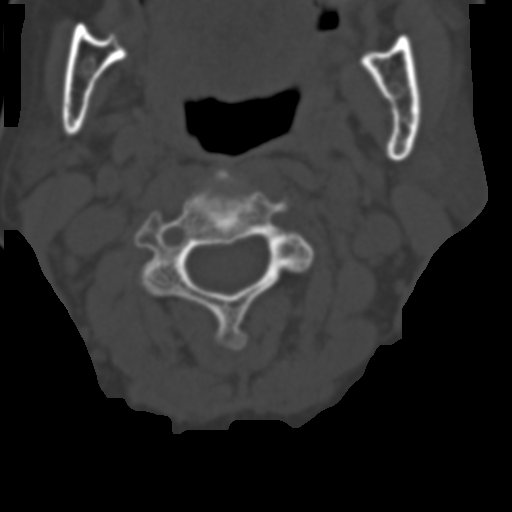
[im 67/84  bone]
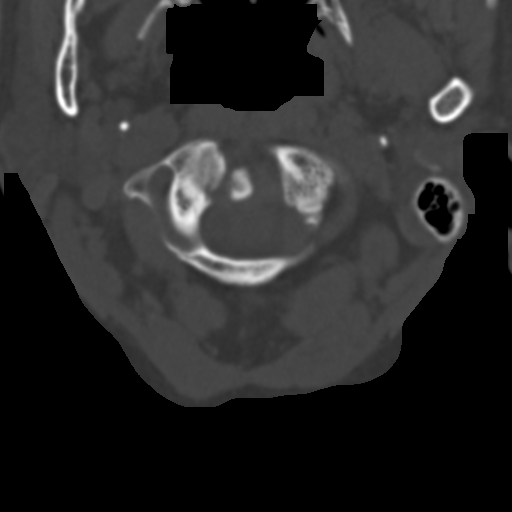

[Series 7: sagittal bone 2.0 · sagittal · 0.22mm/px · 5 of 60 slices shown, 6 images]
[im 20/60  bone]
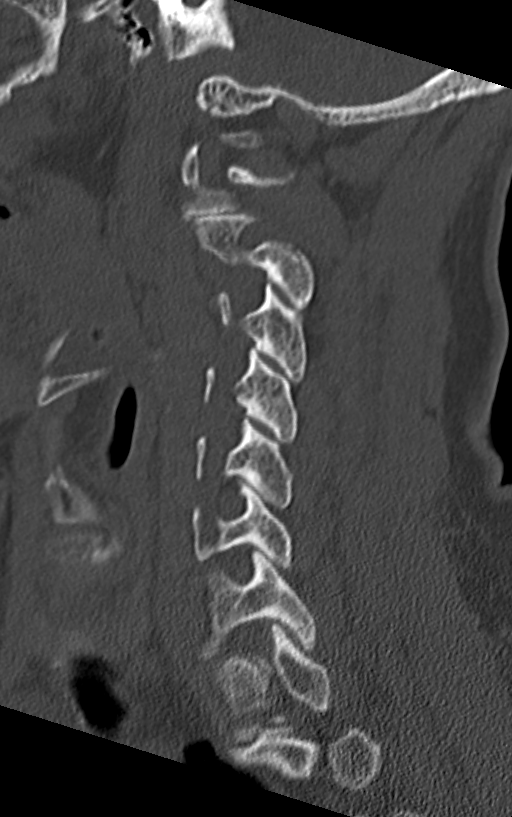
[im 25/60  bone]
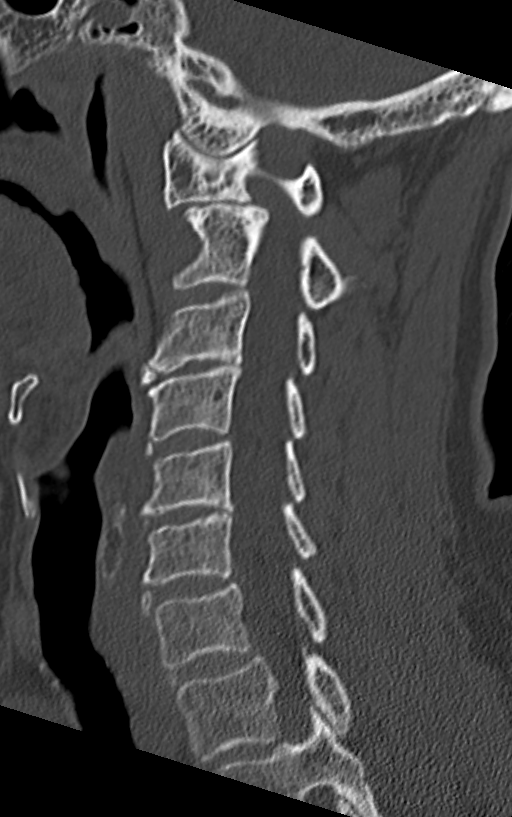
[im 30/60  soft-tissue]
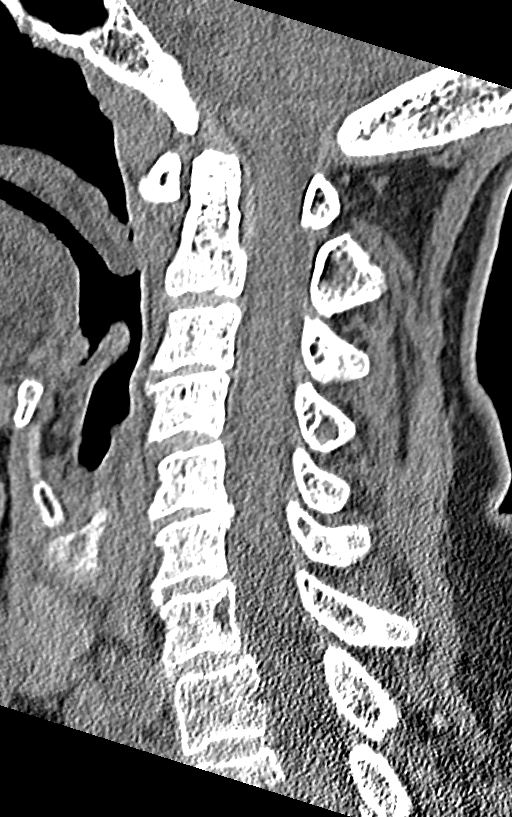
[im 30/60  bone]
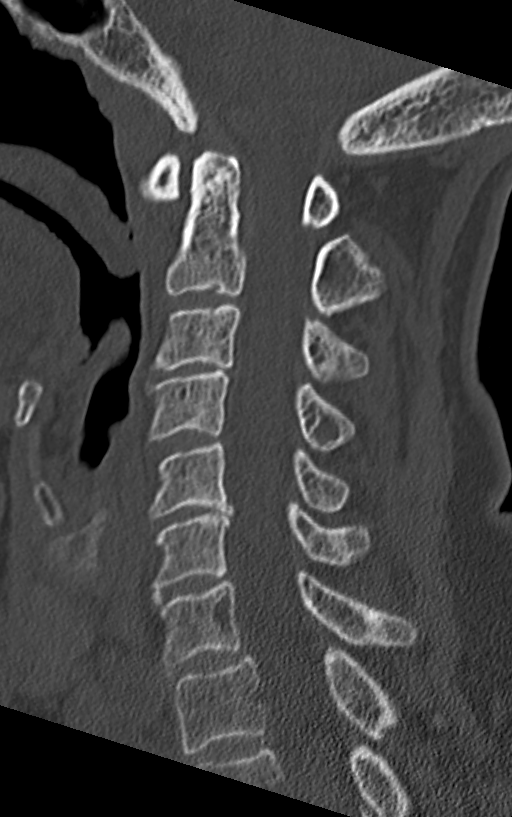
[im 35/60  bone]
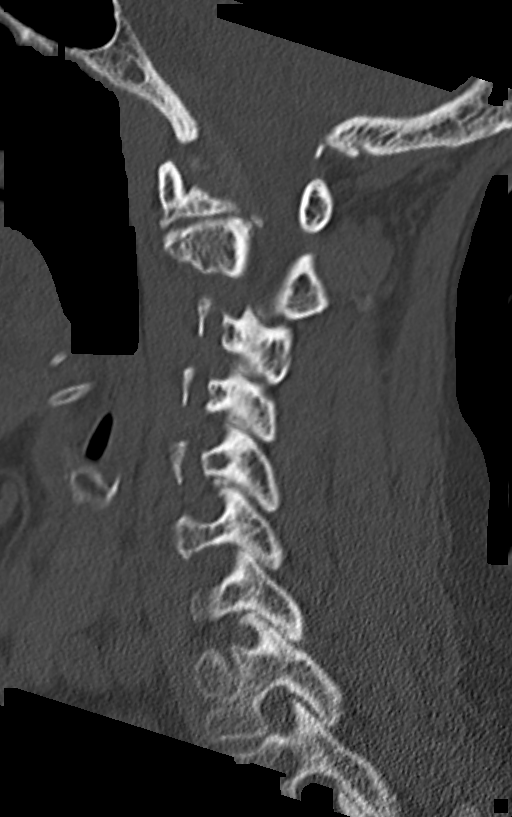
[im 40/60  bone]
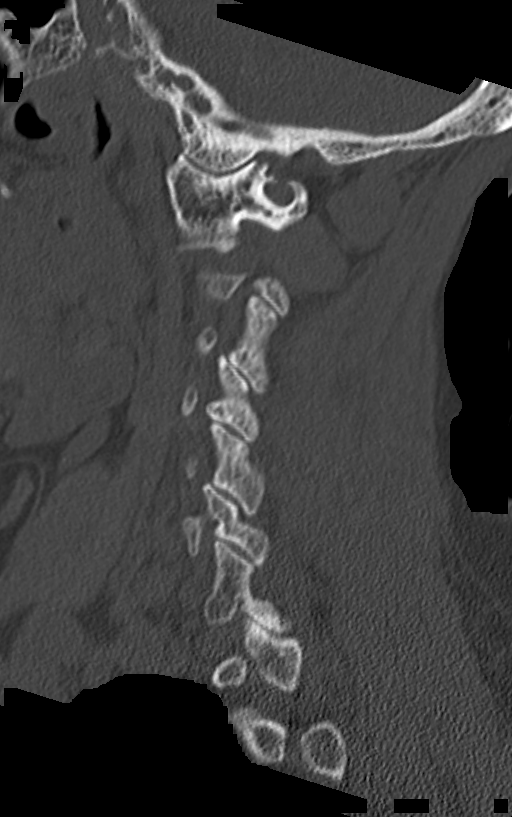

[Series 8: coronal bone 2.0 · coronal · 0.23mm/px · 3 of 52 slices shown]
[im 11/52  bone]
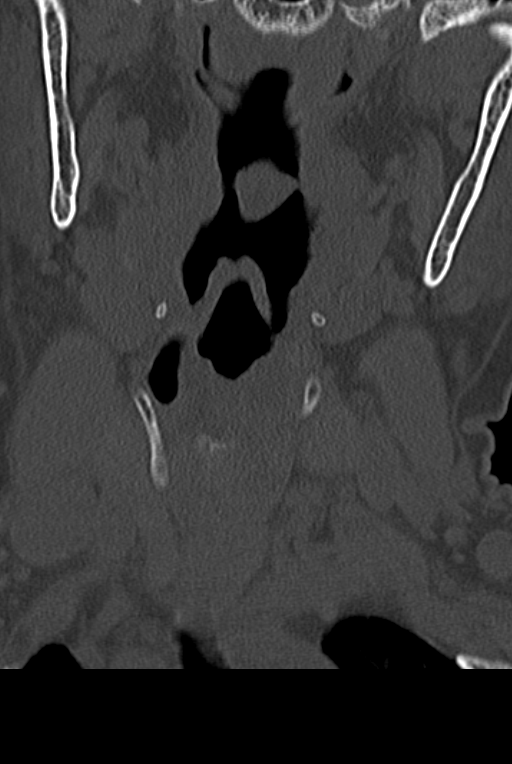
[im 21/52  bone]
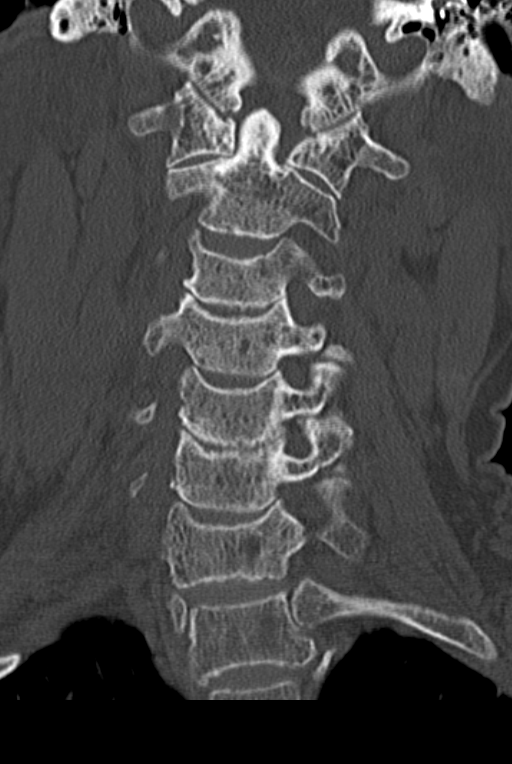
[im 31/52  bone]
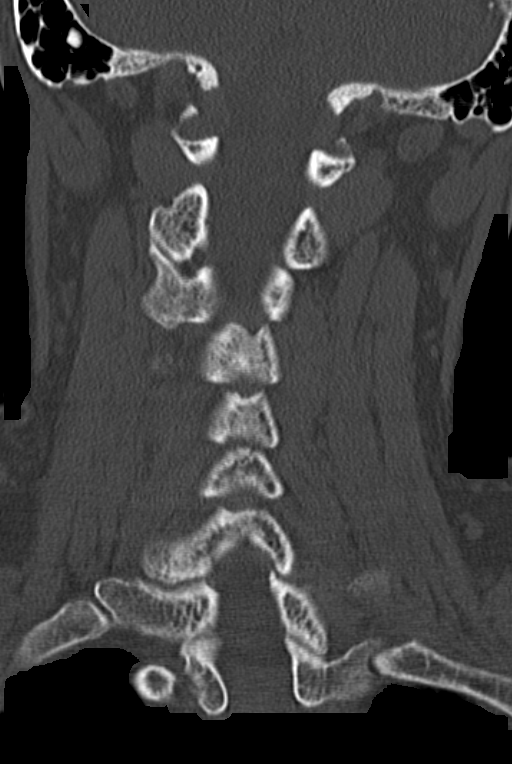

[12 of 33 positions shown; findings below may reference images not displayed]

FINDINGS: Ventricle size is normal.  Age appropriate atrophy.
Negative for acute infarct.  Negative for hemorrhage or mass
lesion.  Negative for skull fracture.
IMPRESSION: No acute intracranial abnormality.

CT CERVICAL SPINE
FINDINGS: Negative for fracture.  Normal alignment.

Degenerative changes are present in the cervical spine.  There is
degenerative change at C1-C2.  Disc degeneration and spondylosis of
a mild degree at C3-4 and C5-6.
IMPRESSION: Negative for cervical spine fracture.

## 2014-07-16 ENCOUNTER — Encounter: Payer: Self-pay | Admitting: Obstetrics and Gynecology

## 2014-07-16 ENCOUNTER — Ambulatory Visit (INDEPENDENT_AMBULATORY_CARE_PROVIDER_SITE_OTHER): Payer: Medicare Other | Admitting: Obstetrics and Gynecology

## 2014-07-16 VITALS — BP 140/76 | Wt 133.0 lb

## 2014-07-16 DIAGNOSIS — N72 Inflammatory disease of cervix uteri: Secondary | ICD-10-CM | POA: Insufficient documentation

## 2014-07-16 DIAGNOSIS — F0390 Unspecified dementia without behavioral disturbance: Secondary | ICD-10-CM

## 2014-07-16 DIAGNOSIS — Z01419 Encounter for gynecological examination (general) (routine) without abnormal findings: Secondary | ICD-10-CM

## 2014-07-16 DIAGNOSIS — R4182 Altered mental status, unspecified: Secondary | ICD-10-CM | POA: Insufficient documentation

## 2014-07-16 DIAGNOSIS — I10 Essential (primary) hypertension: Secondary | ICD-10-CM

## 2014-07-16 MED ORDER — METRONIDAZOLE 500 MG PO TABS: 500.0000 mg | ORAL_TABLET | Freq: Two times a day (BID) | ORAL | Status: DC

## 2014-07-16 NOTE — Progress Notes (Signed)
Patient ID: [name], female   DOB: June 08, 1930, 80 y.o.   MRN: [id number]  Assessment:  1.   Foreign body in vagina; removed  2.   Cervicitis secondary to foreign body   3.   Rule out cervical cancer Plan:  1. Metronidazole twice a day x7 days 2.   Recheck in 2 weeks.  Subjective:  [name] is a 80 y.o. female No obstetric history on file. who presents for annual exam. No LMP recorded. Patient is postmenopausal. Pt here today for annual exam. [name] states that the pt has had vaginal bleeding for over a year. [name] states that she has seen bright red bleeding Friday before last. Pt voided today in the office and when she wiped there was brownish blood on the toilet paper. Pt has dementia and is not oriented to time.    The following portions of the patient's history were reviewed and updated as appropriate: allergies, current medications, past family history, past medical history, past social history, past surgical history and problem list. Past Medical History  Diagnosis Date  . Chronic leg pain   . Poor historian   . Psychosis   . Schizoaffective disorder   . Hypertension   . High cholesterol   . Dementia     Past Surgical History  Procedure Laterality Date  . Tubal ligation    . Foot surgery      Current outpatient prescriptions:acetaminophen (TYLENOL) 500 MG tablet, Take 500 mg by mouth every 8 (eight) hours as needed., Disp: , Rfl: ;  clonazePAM (KLONOPIN) 0.5 MG tablet, Take 0.25 mg by mouth at bedtime., Disp: , Rfl: ;  fentaNYL (DURAGESIC - DOSED MCG/HR) 12 MCG/HR, Place 12.5 mcg onto the skin every 3 (three) days., Disp: , Rfl: ;  ferrous sulfate 325 (65 FE) MG tablet, Take 325 mg by mouth daily with breakfast., Disp: , Rfl:  fluticasone (FLONASE) 50 MCG/ACT nasal spray, Place 2 sprays into both nostrils daily., Disp: , Rfl: ;  HYDROcodone-acetaminophen (NORCO/VICODIN) 5-325 MG per tablet, Take 1 tablet by mouth every 4 (four) hours as needed for  moderate pain., Disp: , Rfl: ;  lisinopril (PRINIVIL,ZESTRIL) 10 MG tablet, Take 10 mg by mouth daily., Disp: , Rfl:  magnesium hydroxide (MILK OF MAGNESIA) 400 MG/5ML suspension, Take 5 mLs by mouth daily as needed for mild constipation., Disp: , Rfl: ;  Melatonin 3 MG TABS, Take 1 tablet by mouth daily., Disp: , Rfl: ;  Multiple Vitamins-Minerals (MULTIVITAMIN WITH MINERALS) tablet, Take 1 tablet by mouth daily., Disp: , Rfl: ;  polyethylene glycol (MIRALAX / GLYCOLAX) packet, Take 17 g by mouth daily., Disp: , Rfl:   Review of Systems Constitutional: negative Gastrointestinal: negative Genitourinary: negative  Objective:  BP 140/76  Wt 133 lb (60.328 kg)   BMI: Body mass index is 22.82 kg/(m^2).  General Appearance: Alert, appropriate appearance for age. No acute distress HEENT: Grossly normal Neck / Thyroid:  Cardiovascular: RRR; normal S1, S2, no murmur Lungs unlabored breathing Breast Exam not cked Gastrointestinal: Soft, non-tender, no masses or organomegaly Pelvic Exam: External genitalia: normal general appearance Vaginal: normal mucosa without prolapse or lesions and normal without tenderness, induration or masses.  Foreign body removed from the vagina.  Heavy bleeding noted above it.  Good vaginal length. Cervix: normal appearance.  Firm cervix. BIOPSIES X2 AT 12OCLOCK    Adnexa: normal bimanual exam.  2 mm endometrial stripe.  No adnexal abnormalities.   Uterus: normal single, non-tender.  Tiny and  anteflex with slight thickening inside the uterus.   Rectovaginal: normal rectal, no masses Lymphatic Exam: Non-palpable nodes in neck, clavicular, axillary, or inguinal regions  Skin: no rash or abnormalities Neurologic: Normal gait and speech, no tremor  Psychiatric: Alert and oriented, appropriate affect.  Urinalysis:Not done  Mallory Shirk. MD Pgr [phone] 2:49 PM    This chart was scribed by Donato Schultz, Medical Scribe, for Dr. Mallory Shirk on 07/16/2014 at 2:56  PM. This chart was reviewed by Dr. Mallory Shirk for accuracy.

## 2014-07-16 NOTE — Addendum Note (Signed)
Addended by: Farley Ly on: 07/16/2014 04:35 PM   Modules accepted: Orders

## 2014-07-21 ENCOUNTER — Other Ambulatory Visit: Payer: Self-pay | Admitting: Obstetrics and Gynecology

## 2014-07-22 ENCOUNTER — Encounter (HOSPITAL_COMMUNITY): Payer: Self-pay | Admitting: Emergency Medicine

## 2014-07-22 ENCOUNTER — Inpatient Hospital Stay (HOSPITAL_COMMUNITY)
Admission: EM | Admit: 2014-07-22 | Discharge: 2014-07-24 | DRG: 760 | Disposition: A | Discharge status: Skilled Nursing Facility | Payer: Medicare Other | Attending: Internal Medicine | Admitting: Internal Medicine

## 2014-07-22 DIAGNOSIS — N939 Abnormal uterine and vaginal bleeding, unspecified: Secondary | ICD-10-CM | POA: Diagnosis present

## 2014-07-22 DIAGNOSIS — F259 Schizoaffective disorder, unspecified: Secondary | ICD-10-CM | POA: Diagnosis present

## 2014-07-22 DIAGNOSIS — N898 Other specified noninflammatory disorders of vagina: Secondary | ICD-10-CM

## 2014-07-22 DIAGNOSIS — F0391 Unspecified dementia with behavioral disturbance: Secondary | ICD-10-CM | POA: Diagnosis present

## 2014-07-22 DIAGNOSIS — F03918 Unspecified dementia, unspecified severity, with other behavioral disturbance: Secondary | ICD-10-CM | POA: Diagnosis present

## 2014-07-22 DIAGNOSIS — N179 Acute kidney failure, unspecified: Secondary | ICD-10-CM | POA: Diagnosis present

## 2014-07-22 DIAGNOSIS — D62 Acute posthemorrhagic anemia: Secondary | ICD-10-CM | POA: Diagnosis present

## 2014-07-22 DIAGNOSIS — N72 Inflammatory disease of cervix uteri: Secondary | ICD-10-CM

## 2014-07-22 DIAGNOSIS — G8929 Other chronic pain: Secondary | ICD-10-CM | POA: Diagnosis present

## 2014-07-22 DIAGNOSIS — N950 Postmenopausal bleeding: Principal | ICD-10-CM | POA: Diagnosis present

## 2014-07-22 DIAGNOSIS — E7800 Pure hypercholesterolemia, unspecified: Secondary | ICD-10-CM | POA: Diagnosis present

## 2014-07-22 DIAGNOSIS — F0390 Unspecified dementia without behavioral disturbance: Secondary | ICD-10-CM | POA: Diagnosis not present

## 2014-07-22 DIAGNOSIS — I10 Essential (primary) hypertension: Secondary | ICD-10-CM | POA: Diagnosis present

## 2014-07-22 DIAGNOSIS — D638 Anemia in other chronic diseases classified elsewhere: Secondary | ICD-10-CM | POA: Diagnosis present

## 2014-07-22 DIAGNOSIS — Z66 Do not resuscitate: Secondary | ICD-10-CM | POA: Diagnosis present

## 2014-07-22 LAB — CBC WITH DIFFERENTIAL/PLATELET
BASOS ABS: 0 10*3/uL (ref 0.0–0.1)
Basophils Relative: 1 % (ref 0–1)
EOS PCT: 2 % (ref 0–5)
Eosinophils Absolute: 0.1 10*3/uL (ref 0.0–0.7)
HEMATOCRIT: 21.5 % — AB (ref 36.0–46.0)
HEMOGLOBIN: 7.3 g/dL — AB (ref 12.0–15.0)
LYMPHS PCT: 37 % (ref 12–46)
Lymphs Abs: 1.9 10*3/uL (ref 0.7–4.0)
MCH: 32.6 pg (ref 26.0–34.0)
MCHC: 34 g/dL (ref 30.0–36.0)
MCV: 96 fL (ref 78.0–100.0)
MONO ABS: 0.4 10*3/uL (ref 0.1–1.0)
MONOS PCT: 8 % (ref 3–12)
Neutro Abs: 2.6 10*3/uL (ref 1.7–7.7)
Neutrophils Relative %: 52 % (ref 43–77)
Platelets: 135 10*3/uL — ABNORMAL LOW (ref 150–400)
RBC: 2.24 MIL/uL — ABNORMAL LOW (ref 3.87–5.11)
RDW: 13.9 % (ref 11.5–15.5)
WBC: 5 10*3/uL (ref 4.0–10.5)

## 2014-07-22 LAB — ABO/RH: ABO/RH(D): O POS

## 2014-07-22 LAB — BASIC METABOLIC PANEL
Anion gap: 4 — ABNORMAL LOW (ref 5–15)
BUN: 13 mg/dL (ref 6–23)
CALCIUM: 8.3 mg/dL — AB (ref 8.4–10.5)
CO2: 27 meq/L (ref 19–32)
CREATININE: 0.92 mg/dL (ref 0.50–1.10)
Chloride: 99 mEq/L (ref 96–112)
GFR calc Af Amer: 65 mL/min — ABNORMAL LOW (ref >90)
GFR calc non Af Amer: 56 mL/min — ABNORMAL LOW (ref >90)
GLUCOSE: 96 mg/dL (ref 70–99)
Potassium: 3.9 mEq/L (ref 3.7–5.3)
SODIUM: 130 meq/L — AB (ref 137–147)

## 2014-07-22 LAB — PREPARE RBC (CROSSMATCH)

## 2014-07-22 LAB — PROTIME-INR
INR: 1.56 — ABNORMAL HIGH (ref 0.00–1.49)
Prothrombin Time: 18.7 seconds — ABNORMAL HIGH (ref 11.6–15.2)

## 2014-07-22 MED ORDER — LISINOPRIL 10 MG PO TABS: 10.0000 mg | ORAL_TABLET | Freq: Every day | ORAL | Status: DC

## 2014-07-22 MED ORDER — MULTI-VITAMIN/MINERALS PO TABS: 1.0000 | ORAL_TABLET | Freq: Every day | ORAL | Status: DC

## 2014-07-22 MED ORDER — METRONIDAZOLE 500 MG PO TABS
500.0000 mg | ORAL_TABLET | Freq: Two times a day (BID) | ORAL | Status: DC
  Administered 2014-07-22 – 2014-07-24 (×4): 500 mg via ORAL
  Filled 2014-07-22 (×4): qty 1

## 2014-07-22 MED ORDER — SODIUM CHLORIDE 0.9 % IV SOLN
INTRAVENOUS | Status: DC
  Administered 2014-07-22 – 2014-07-24 (×2): via INTRAVENOUS

## 2014-07-22 MED ORDER — POLYETHYLENE GLYCOL 3350 17 G PO PACK
17.0000 g | PACK | Freq: Every day | ORAL | Status: DC
  Administered 2014-07-23 – 2014-07-24 (×2): 17 g via ORAL
  Filled 2014-07-22 (×2): qty 1

## 2014-07-22 MED ORDER — SODIUM CHLORIDE 0.9 % IV SOLN: Freq: Once | INTRAVENOUS | Status: AC

## 2014-07-22 MED ORDER — FERROUS SULFATE 325 (65 FE) MG PO TABS
325.0000 mg | ORAL_TABLET | Freq: Every day | ORAL | Status: DC
  Administered 2014-07-23 – 2014-07-24 (×2): 325 mg via ORAL
  Filled 2014-07-22 (×2): qty 1

## 2014-07-22 MED ORDER — HYDROCODONE-ACETAMINOPHEN 5-325 MG PO TABS
1.0000 | ORAL_TABLET | ORAL | Status: DC | PRN
Start: 2014-07-22 — End: 2014-07-24

## 2014-07-22 MED ORDER — CLONAZEPAM 0.5 MG PO TABS
0.2500 mg | ORAL_TABLET | Freq: Every day | ORAL | Status: DC
  Administered 2014-07-22 – 2014-07-23 (×2): 0.25 mg via ORAL
  Filled 2014-07-22 (×2): qty 1

## 2014-07-22 MED ORDER — MELATONIN 3 MG PO TABS: 1.0000 | ORAL_TABLET | Freq: Every evening | ORAL | Status: DC | PRN

## 2014-07-22 MED ORDER — MAGNESIUM HYDROXIDE 400 MG/5ML PO SUSP: 30.0000 mL | Freq: Every day | ORAL | Status: DC | PRN

## 2014-07-22 MED ORDER — FLUTICASONE PROPIONATE 50 MCG/ACT NA SUSP
2.0000 | Freq: Every day | NASAL | Status: DC
  Administered 2014-07-23 – 2014-07-24 (×2): 2 via NASAL
  Filled 2014-07-22: qty 16

## 2014-07-22 MED ORDER — INFLUENZA VAC SPLIT QUAD 0.5 ML IM SUSY
0.5000 mL | PREFILLED_SYRINGE | INTRAMUSCULAR | Status: AC
  Administered 2014-07-23: 0.5 mL via INTRAMUSCULAR
  Filled 2014-07-22: qty 0.5

## 2014-07-22 MED ORDER — FENTANYL 12 MCG/HR TD PT72
12.5000 ug | MEDICATED_PATCH | TRANSDERMAL | Status: DC
  Administered 2014-07-22: 12.5 ug via TRANSDERMAL
  Filled 2014-07-22: qty 1

## 2014-07-22 MED ORDER — STERILE WATER FOR INJECTION IJ SOLN
INTRAMUSCULAR | Status: AC
  Filled 2014-07-22: qty 10

## 2014-07-22 MED ORDER — ONDANSETRON HCL 4 MG PO TABS: 4.0000 mg | ORAL_TABLET | Freq: Four times a day (QID) | ORAL | Status: DC | PRN

## 2014-07-22 MED ORDER — ACETAMINOPHEN 500 MG PO TABS: 1000.0000 mg | ORAL_TABLET | Freq: Three times a day (TID) | ORAL | Status: DC | PRN

## 2014-07-22 MED ORDER — ONDANSETRON HCL 4 MG/2ML IJ SOLN: 4.0000 mg | Freq: Four times a day (QID) | INTRAMUSCULAR | Status: DC | PRN

## 2014-07-22 MED ORDER — ADULT MULTIVITAMIN W/MINERALS CH
1.0000 | ORAL_TABLET | Freq: Every day | ORAL | Status: DC
  Administered 2014-07-23 – 2014-07-24 (×2): 1 via ORAL
  Filled 2014-07-22 (×2): qty 1

## 2014-07-22 NOTE — ED Provider Notes (Signed)
CSN: [id number]     Arrival date & time 07/22/14  1459 History  This chart was scribed for Sharyon Cable, MD by Ludger Nutting, ED Scribe. This patient was seen in room APA01/APA01 and the patient's care was started 3:12 PM.    Chief Complaint  Patient presents with  . Vaginal Bleeding    The history is provided by the patient and a relative. The history is limited by the condition of the patient. No language interpreter was used.   LEVEL 5 CAVEAT- DEMENTIA  HPI Comments: [name] is a 80 y.o. female who presents to the Emergency Department complaining of continued vaginal bleeding that has been ongoing for the past 1 week. Per family member, patient was seen by Dr. Glo Herring on 07/16/14. Per Dr. Johnnye Sima note, patient was diagnosed with cervicitis secondary to a foreign body and was started on metronidazole BID for 1 week. Patient is a resident at the [facility] in [place]. She denies HA, chest pain, abdominal pain, fever, vomiting, extremity pain.   Past Medical History  Diagnosis Date  . Chronic leg pain   . Poor historian   . Psychosis   . Schizoaffective disorder   . Hypertension   . High cholesterol   . Dementia    Past Surgical History  Procedure Laterality Date  . Tubal ligation    . Foot surgery     No family history on file. History  Substance Use Topics  . Smoking status: Never Smoker   . Smokeless tobacco: Not on file  . Alcohol Use: No   OB History   Grav Para Term Preterm Abortions TAB SAB Ect Mult Living                 Review of Systems  Unable to perform ROS: Dementia      Allergies  Review of patient's allergies indicates no known allergies.  Home Medications   Prior to Admission medications   Medication Sig Start Date End Date Taking? Authorizing Provider  acetaminophen (TYLENOL) 500 MG tablet Take 500 mg by mouth every 8 (eight) hours as needed.    Historical Provider, MD  clonazePAM (KLONOPIN) 0.5 MG tablet Take 0.25 mg by mouth  at bedtime.    Historical Provider, MD  fentaNYL (DURAGESIC - DOSED MCG/HR) 12 MCG/HR Place 12.5 mcg onto the skin every 3 (three) days.    Historical Provider, MD  ferrous sulfate 325 (65 FE) MG tablet Take 325 mg by mouth daily with breakfast.    Historical Provider, MD  fluticasone (FLONASE) 50 MCG/ACT nasal spray Place 2 sprays into both nostrils daily.    Historical Provider, MD  HYDROcodone-acetaminophen (NORCO/VICODIN) 5-325 MG per tablet Take 1 tablet by mouth every 4 (four) hours as needed for moderate pain.    Historical Provider, MD  lisinopril (PRINIVIL,ZESTRIL) 10 MG tablet Take 10 mg by mouth daily.    Historical Provider, MD  magnesium hydroxide (MILK OF MAGNESIA) 400 MG/5ML suspension Take 5 mLs by mouth daily as needed for mild constipation.    Historical Provider, MD  Melatonin 3 MG TABS Take 1 tablet by mouth daily.    Historical Provider, MD  metroNIDAZOLE (FLAGYL) 500 MG tablet Take 1 tablet (500 mg total) by mouth 2 (two) times daily. 07/16/14   Jonnie Kind, MD  Multiple Vitamins-Minerals (MULTIVITAMIN WITH MINERALS) tablet Take 1 tablet by mouth daily.    Historical Provider, MD  polyethylene glycol (MIRALAX / GLYCOLAX) packet Take 17 g by mouth daily.  Historical Provider, MD   BP 154/82  Pulse 83  Temp(Src) 98.7 F (37.1 C) (Oral)  Resp 20  SpO2 100% Physical Exam  Nursing note and vitals reviewed.  CONSTITUTIONAL: elderly HEAD: Normocephalic/atraumatic EYES: EOMI/PERRL, conjunctiva pink.  ENMT: Mucous membranes moist NECK: supple no meningeal signs CV: S1/S2 noted, no murmurs/rubs/gallops noted LUNGS: Lungs are clear to auscultation bilaterally, no apparent distress ABDOMEN: soft, nontender, no rebound or guarding HE:NIDPO vaginal bleeding noted.  No foreign body noted.  Female chaperone present NEURO: Pt is awake/alert, moves all extremitiesx4 EXTREMITIES: pulses normal, full ROM SKIN: warm, color normal PSYCH: no abnormalities of mood noted  ED Course   Procedures   DIAGNOSTIC STUDIES: Oxygen Saturation is 100% on RA, normal by my interpretation.    COORDINATION OF CARE: 3:18 PM Discussed treatment plan with pt at bedside and pt agreed to plan.  anemia noted Will consult OBGYN 5:00 PM D/w dr Elonda Husky He reviewed chart Recommends packing with gauze to help with bleeding Will monitor in hospital overnight to ensure bleeding improves and to trend her HGB Family agreeable with plan 5:58 PM With assistance of nurse, one small roll of kerlex was placed in vagina  Pt tolerated procedure well without any complications  Pt denies any pain D/w dr Anastasio Champion, will admit to medical service  Labs Review Labs Reviewed  BASIC METABOLIC PANEL - Abnormal; Notable for the following:    Sodium 130 (*)    Calcium 8.3 (*)    GFR calc non Af Amer 56 (*)    GFR calc Af Amer 65 (*)    Anion gap 4 (*)    All other components within normal limits  CBC WITH DIFFERENTIAL - Abnormal; Notable for the following:    RBC 2.24 (*)    Hemoglobin 7.3 (*)    HCT 21.5 (*)    Platelets 135 (*)    All other components within normal limits  PROTIME-INR - Abnormal; Notable for the following:    Prothrombin Time 18.7 (*)    INR 1.56 (*)    All other components within normal limits  TYPE AND SCREEN     MDM   Final diagnoses:  Acute posthemorrhagic anemia  Vaginal bleeding, abnormal    Nursing notes including past medical history and social history reviewed and considered in documentation Labs/vital reviewed and considered Previous records reviewed and considered   I personally performed the services described in this documentation, which was scribed in my presence. The recorded information has been reviewed and is accurate.      Sharyon Cable, MD 07/22/14 [phone]

## 2014-07-22 NOTE — H&P (Signed)
[facility] History and Physical  [name]:[id number] DOB: Jan 15, 1930 DOA: 07/22/2014  Referring physician: ER. PCP: No primary provider on file.   Chief Complaint: Vagina bleeding.  HPI: [name] is a 80 y.o. female  This is an 80 year old elderly, demented, schizoaffective lady who presents with vagina bleeding which has been going on for approximately one week. She was seen by gynecology, Dr. Glo Herring 6 days ago and apparently there was a foreign body which was removed and she was started on metronidazole. Unfortunately, she's continued to have bleeding. She now presents in the emergency room. She has been found to have a significant anemia with a hemoglobin of 7.3. She is now being admitted for further management. The patient herself has got dementia and schizoaffective disorder and cannot really give a clear history. However, the daughter, who is at the bedside, confirms this history.   Review of Systems:  Constitutional:  No weight loss, night sweats, Fevers, chills, fatigue.  HEENT:  No headaches, Difficulty swallowing,Tooth/dental problems,Sore throat,  No sneezing, itching, ear ache, nasal congestion, post nasal drip,  Cardio-vascular:  No chest pain, Orthopnea, PND, swelling in lower extremities, anasarca, dizziness, palpitations  GI:  No heartburn, indigestion, abdominal pain, nausea, vomiting, diarrhea, change in bowel habits, loss of appetite  Resp:  No shortness of breath with exertion or at rest. No excess mucus, no productive cough, No non-productive cough, No coughing up of blood.No change in color of mucus.No wheezing.No chest wall deformity   Musculoskeletal:  No joint pain or swelling. No decreased range of motion. No back pain.  Psych:  No change in mood or affect. No depression or anxiety. No memory loss.   Past Medical History  Diagnosis Date  . Chronic leg pain   . Poor historian   . Psychosis   . Schizoaffective disorder   .  Hypertension   . High cholesterol   . Dementia    Past Surgical History  Procedure Laterality Date  . Tubal ligation    . Foot surgery     Social History:  reports that she has never smoked. She does not have any smokeless tobacco history on file. She reports that she does not drink alcohol or use illicit drugs.  No Known Allergies  No family history on file.   Prior to Admission medications   Medication Sig Start Date End Date Taking? Authorizing Provider  clonazePAM (KLONOPIN) 0.5 MG tablet Take 0.25 mg by mouth at bedtime.   Yes Historical Provider, MD  fentaNYL (DURAGESIC - DOSED MCG/HR) 12 MCG/HR Place 12.5 mcg onto the skin every 3 (three) days.   Yes Historical Provider, MD  ferrous sulfate 325 (65 FE) MG tablet Take 325 mg by mouth daily with breakfast.   Yes Historical Provider, MD  fluticasone (FLONASE) 50 MCG/ACT nasal spray Place 2 sprays into both nostrils daily.   Yes Historical Provider, MD  lisinopril (PRINIVIL,ZESTRIL) 10 MG tablet Take 10 mg by mouth daily.   Yes Historical Provider, MD  metroNIDAZOLE (FLAGYL) 500 MG tablet Take 1 tablet (500 mg total) by mouth 2 (two) times daily. 07/16/14  Yes Jonnie Kind, MD  Multiple Vitamins-Minerals (MULTIVITAMIN WITH MINERALS) tablet Take 1 tablet by mouth daily.   Yes Historical Provider, MD  polyethylene glycol (MIRALAX / GLYCOLAX) packet Take 17 g by mouth daily.   Yes Historical Provider, MD  acetaminophen (TYLENOL) 500 MG tablet Take 1,000 mg by mouth 3 (three) times daily as needed for mild pain.  Historical Provider, MD  HYDROcodone-acetaminophen (NORCO/VICODIN) 5-325 MG per tablet Take 1 tablet by mouth every 4 (four) hours as needed (pain).     Historical Provider, MD  magnesium hydroxide (MILK OF MAGNESIA) 400 MG/5ML suspension Take 30 mLs by mouth daily as needed (bowel mobility).     Historical Provider, MD  Melatonin 3 MG TABS Take 1 tablet by mouth at bedtime as needed (insomnia).     Historical Provider, MD    Physical Exam: Filed Vitals:   07/22/14 1503 07/22/14 1844  BP: 154/82 136/65  Pulse: 83 89  Temp: 98.7 F (37.1 C) 97.5 F (36.4 C)  TempSrc: Oral Oral  Resp: 20 18  SpO2: 100% 97%    Wt Readings from Last 3 Encounters:  07/16/14 60.328 kg (133 lb)  06/01/12 73.7 kg (162 lb 7.7 oz)  05/27/12 74 kg (163 lb 2.3 oz)    General:  Appears calm and comfortable. She looks somewhat pale. She is hemodynamically stable. Eyes: PERRL, normal lids, irises & conjunctiva ENT: grossly normal hearing, [name] & tongue Neck: no LAD, masses or thyromegaly Cardiovascular: RRR, no m/r/g. No LE edema. Telemetry: SR, no arrhythmias  Respiratory: CTA bilaterally, no w/r/r. Normal respiratory effort. Abdomen: soft, ntnd Skin: no rash or induration seen on limited exam Musculoskeletal: grossly normal tone BUE/BLE Psychiatric: grossly normal mood and affect, speech fluent and appropriate Neurologic: grossly non-focal.          Labs on Admission:  Basic Metabolic Panel:  Recent Labs Lab 07/22/14 1531  NA 130*  K 3.9  CL 99  CO2 27  GLUCOSE 96  BUN 13  CREATININE 0.92  CALCIUM 8.3*   Liver Function Tests: No results found for this basename: AST, ALT, ALKPHOS, BILITOT, PROT, ALBUMIN,  in the last 168 hours No results found for this basename: LIPASE, AMYLASE,  in the last 168 hours No results found for this basename: AMMONIA,  in the last 168 hours CBC:  Recent Labs Lab 07/22/14 1531  WBC 5.0  NEUTROABS 2.6  HGB 7.3*  HCT 21.5*  MCV 96.0  PLT 135*   Cardiac Enzymes: No results found for this basename: CKTOTAL, CKMB, CKMBINDEX, TROPONINI,  in the last 168 hours  BNP (last 3 results) No results found for this basename: PROBNP,  in the last 8760 hours CBG: No results found for this basename: GLUCAP,  in the last 168 hours  Radiological Exams on Admission: No results found.    Assessment/Plan   1. Vagina bleeding, unclear etiology. 2. Acute blood loss  anemia. 3. Hypertension. 4. Dementia. 5. Schizoaffective disorder, appears to be stable.  Plan: 1. Admit to medical floor. 2. 2 units blood transfusion. 3. Gynecology consultation in the morning.  Other recommendations will depend on patient's hospital progress.  Code Status: Full code.  DVT Prophylaxis: SCDs.  Family Communication: I discussed the plan with the patient's daughter at the bedside.  Disposition Plan: Home when medically stable.   Time spent: 60 minutes.  Doree Albee [facility] Pager [phone].  **Disclaimer: This note may have been dictated with voice recognition software. Similar sounding words can inadvertently be transcribed and this note may contain transcription errors which may not have been corrected upon publication of note.**

## 2014-07-22 NOTE — ED Notes (Signed)
Pt here from [facility] for evaluation of vaginal bleeding and passing clots. Pt is confused but states no one has assaulted her, and no one has talked to her husband about sex. Bright red noted on towel under pt

## 2014-07-22 NOTE — ED Notes (Signed)
Assisted Dr. Christy Gentles with assessing vaginal bleeding. Dr. Christy Gentles attempted to place small curlex in vagina to stop bleeding. curlex saturated and dripping blood. Informed Dr. Christy Gentles bleeding was still persistant.

## 2014-07-23 DIAGNOSIS — N179 Acute kidney failure, unspecified: Secondary | ICD-10-CM

## 2014-07-23 DIAGNOSIS — N950 Postmenopausal bleeding: Secondary | ICD-10-CM | POA: Diagnosis not present

## 2014-07-23 LAB — CBC
HCT: 23.5 % — ABNORMAL LOW (ref 36.0–46.0)
Hemoglobin: 8.3 g/dL — ABNORMAL LOW (ref 12.0–15.0)
MCH: 32.9 pg (ref 26.0–34.0)
MCHC: 35.3 g/dL (ref 30.0–36.0)
MCV: 93.3 fL (ref 78.0–100.0)
Platelets: 111 10*3/uL — ABNORMAL LOW (ref 150–400)
RBC: 2.52 MIL/uL — ABNORMAL LOW (ref 3.87–5.11)
RDW: 15.5 % (ref 11.5–15.5)
WBC: 8.4 10*3/uL (ref 4.0–10.5)

## 2014-07-23 LAB — URINE MICROSCOPIC-ADD ON

## 2014-07-23 LAB — URINALYSIS, ROUTINE W REFLEX MICROSCOPIC
BILIRUBIN URINE: NEGATIVE
GLUCOSE, UA: NEGATIVE mg/dL
KETONES UR: NEGATIVE mg/dL
Leukocytes, UA: NEGATIVE
Nitrite: NEGATIVE
PROTEIN: NEGATIVE mg/dL
Specific Gravity, Urine: 1.015 (ref 1.005–1.030)
Urobilinogen, UA: 0.2 mg/dL (ref 0.0–1.0)
pH: 5 (ref 5.0–8.0)

## 2014-07-23 LAB — COMPREHENSIVE METABOLIC PANEL
ALK PHOS: 45 U/L (ref 39–117)
ALT: 31 U/L (ref 0–35)
AST: 41 U/L — ABNORMAL HIGH (ref 0–37)
Albumin: 2.2 g/dL — ABNORMAL LOW (ref 3.5–5.2)
Anion gap: 6 (ref 5–15)
BUN: 21 mg/dL (ref 6–23)
CO2: 24 mEq/L (ref 19–32)
Calcium: 7.8 mg/dL — ABNORMAL LOW (ref 8.4–10.5)
Chloride: 103 mEq/L (ref 96–112)
Creatinine, Ser: 1.43 mg/dL — ABNORMAL HIGH (ref 0.50–1.10)
GFR, EST AFRICAN AMERICAN: 38 mL/min — AB (ref >90)
GFR, EST NON AFRICAN AMERICAN: 33 mL/min — AB (ref >90)
GLUCOSE: 107 mg/dL — AB (ref 70–99)
POTASSIUM: 4.2 meq/L (ref 3.7–5.3)
SODIUM: 133 meq/L — AB (ref 137–147)
Total Bilirubin: 1.1 mg/dL (ref 0.3–1.2)
Total Protein: 11.7 g/dL — ABNORMAL HIGH (ref 6.0–8.3)

## 2014-07-23 LAB — PREPARE RBC (CROSSMATCH)

## 2014-07-23 MED ORDER — FUROSEMIDE 10 MG/ML IJ SOLN
20.0000 mg | Freq: Once | INTRAMUSCULAR | Status: AC
  Administered 2014-07-23: 20 mg via INTRAVENOUS
  Filled 2014-07-23: qty 2

## 2014-07-23 NOTE — Clinical Social Work Placement (Signed)
Clinical Social Work Department CLINICAL SOCIAL WORK PLACEMENT NOTE 07/23/2014  Patient:  [name], [name]  Account Number:  [id number] Admit date:  07/22/2014  Clinical Social Worker:  Benay Pike, LCSW  Date/time:  07/23/2014 03:33 PM  Clinical Social Work is seeking post-discharge placement for this patient at the following level of care:   [place]   (*CSW will update this form in Epic as items are completed)   07/23/2014  Patient/family provided with [place] Department of Clinical Social Work's list of facilities offering this level of care within the geographic area requested by the patient (or if unable, by the patient's family).  07/23/2014  Patient/family informed of their freedom to choose among providers that offer the needed level of care, that participate in Medicare, Medicaid or managed care program needed by the patient, have an available bed and are willing to accept the patient.  07/23/2014  Patient/family informed of [facility]' ownership interest in [facility], as well as of the fact that they are under no obligation to receive care at this facility.  PASARR submitted to EDS on  PASARR number received on   FL2 transmitted to all facilities in geographic area requested by pt/family on  07/23/2014 FL2 transmitted to all facilities within larger geographic area on   Patient informed that his/her managed care company has contracts with or will negotiate with  certain facilities, including the following:     Patient/family informed of bed offers received:   Patient chooses bed at  Physician recommends and patient chooses bed at    Patient to be transferred to  on   Patient to be transferred to facility by  Patient and family notified of transfer on  Name of family member notified:    The following physician request were entered in Epic:   Additional Comments: Pt has existing pasarr.  Benay Pike, [place]

## 2014-07-23 NOTE — Progress Notes (Signed)
[facility] PROGRESS NOTE  [name] YNW:[id number] DOB: [birth year] DOA: 07/22/2014 PCP: No primary provider on file.  Assessment/Plan: 1-vaginal bleeding: biopsy by dr. Glo Herring done recently, no results available on the system yet. Most likely vaginal atrophy also playing a role. -gynecology has been consulted and will follow recommendations  2-anemia: ABLA (due to vaginal bleeding) -will transfuse another unit of PRBC for a total of 3 units -will also start ferrous sulfate BID -follow Hgb trend -addressed bleeding   3-HTN: stable. Will hold lisinopril due to renal failure  4-dementia with behavioral disturbance: continue supportive care and klonopin  5-acute renal failure: due to decrease perfusion with anemia and decrease PO intake -will check UA, stop lisinopril for now -follow BMET in am  Code Status: DNR Family Communication: daughter at bedside  Disposition Plan: will need SNF at discharge   Consultants:  Gynecology service  Procedures:  None   Antibiotics:  Metronidazole   HPI/Subjective: Disoriented at baseline, no complaining of pain or SOB. Patient is afebrile. Hgb 8.3 after 2 units transfused.   Objective: Filed Vitals:   07/23/14 0656  BP: 122/48  Pulse: 68  Temp: 99.5 F (37.5 C)  Resp: 14    Intake/Output Summary (Last 24 hours) at 07/23/14 1133 Last data filed at 07/23/14 6578  Gross per 24 hour  Intake   1195 ml  Output      0 ml  Net   1195 ml   Filed Weights   07/23/14 0900  Weight: 60.329 kg (133 lb)    Exam:   General:  No complaining of pain; no fever. Patient is disoriented at bedside and unable to collaborates on ROS or expressing further complaints.  Cardiovascular: S1 and S2, no rubs, no gallops, no murmurs  Respiratory: CTA bilaterally  Abdomen: soft, NT, ND, positive BS  Musculoskeletal: no edema or cyanosis   Data Reviewed: Basic Metabolic Panel:  Recent Labs Lab 07/22/14 1531 07/23/14 0526  NA  130* 133*  K 3.9 4.2  CL 99 103  CO2 27 24  GLUCOSE 96 107*  BUN 13 21  CREATININE 0.92 1.43*  CALCIUM 8.3* 7.8*   Liver Function Tests:  Recent Labs Lab 07/23/14 0526  AST 41*  ALT 31  ALKPHOS 45  BILITOT 1.1  PROT 11.7*  ALBUMIN 2.2*   CBC:  Recent Labs Lab 07/22/14 1531 07/23/14 0526  WBC 5.0 8.4  NEUTROABS 2.6  --   HGB 7.3* 8.3*  HCT 21.5* 23.5*  MCV 96.0 93.3  PLT 135* 111*     Studies: No results found.  Scheduled Meds: . clonazePAM  0.25 mg Oral QHS  . fentaNYL  12.5 mcg Transdermal Q72H  . ferrous sulfate  325 mg Oral Q breakfast  . fluticasone  2 spray Each Nare Daily  . Influenza vac split quadrivalent PF  0.5 mL Intramuscular Tomorrow-1000  . metroNIDAZOLE  500 mg Oral BID  . multivitamin with minerals  1 tablet Oral Daily  . polyethylene glycol  17 g Oral Daily   Continuous Infusions: . sodium chloride 50 mL/hr at 07/23/14 1032    Active Problems:   Dementia, unspecified, without behavioral disturbance   Unspecified essential hypertension   Vaginal bleeding   Schizoaffective disorder, unspecified condition   Vagina bleeding    Time spent: >30 minutes    Barton Dubois  [facility] Pager [phone]. If 7PM-7AM, please contact night-coverage at [email or web address], password [facility] 07/23/2014, 11:33 AM  LOS: 1 day

## 2014-07-23 NOTE — Clinical Social Work Psychosocial (Signed)
Clinical Social Work Department BRIEF PSYCHOSOCIAL ASSESSMENT 07/23/2014  Patient:  [name], [name]     Account Number:  [id number]     Admit date:  07/22/2014  Clinical Social Worker:  [name]  Date/Time:  07/23/2014 03:35 PM  Referred by:  CSW  Date Referred:  07/23/2014 Referred for  SNF Placement   Other Referral:   Interview type:  Family Other interview type:   daughter- [name]    PSYCHOSOCIAL DATA Living Status:  FACILITY Admitted from facility:  [facility] Level of care:  [place] Primary support name:  [name] Primary support relationship to patient:  CHILD, ADULT Degree of support available:   supportive    CURRENT CONCERNS Current Concerns  Post-Acute Placement   Other Concerns:    SOCIAL WORK ASSESSMENT / PLAN CSW met with pt and pt's daughter, [name] at bedside. Pt oriented to self only. [name] reports she is pt's guardian. Pt has 8 children, 7 living. However, [name] states she does everything. She said pt has been a resident at [facility] for the past 2 years. Prior to that she was at [facility], but she said they were unable to meet pt's needs, so pt went to [facility] for memory care unit. [name] lives in [place] and said she sees pt every chance she gets, although she does not have transportation. She will bring pt to her home on holidays and other occasions. [name] has concerns about the fact that she provides items to pt and they will then be missing next time she is there. She said facility will reimburse her for some of it, but it is frustrating that she has lost a lot of clothing, glasses, etc. [name] also is upset that pt's hair is not fixed as she asks and often does it herself. She would like new bed search to see if anything is available. Discussed that need for memory care unit would limit options. Pt is mobile and [name] said even at her home she will try to  leave when visiting. She requests [employer]. CSW will complete and follow up.   Assessment/plan status:  Psychosocial Support/Ongoing Assessment of Needs Other assessment/ plan:   Information/referral to community resources:   SNF list    PATIENT'S/FAMILY'S RESPONSE TO PLAN OF CARE: Pt unable to discuss plan of care due to dementia. [name] requesting [employer] bed search to see if any other facilities are available.       [name], [place]

## 2014-07-23 NOTE — Progress Notes (Signed)
Patient received one unit of packed red blood cells per MD order. Patient tolerated the infusion well and had no complaints. Patients daughter was at bedside. Bed at lowest level, call bell within reach, and phone on bedside table. Will continue to monitor patient at this time.

## 2014-07-24 ENCOUNTER — Inpatient Hospital Stay (HOSPITAL_COMMUNITY): Payer: Medicare Other

## 2014-07-24 DIAGNOSIS — N72 Inflammatory disease of cervix uteri: Secondary | ICD-10-CM

## 2014-07-24 DIAGNOSIS — N938 Other specified abnormal uterine and vaginal bleeding: Secondary | ICD-10-CM

## 2014-07-24 DIAGNOSIS — N949 Unspecified condition associated with female genital organs and menstrual cycle: Secondary | ICD-10-CM

## 2014-07-24 DIAGNOSIS — D500 Iron deficiency anemia secondary to blood loss (chronic): Secondary | ICD-10-CM

## 2014-07-24 LAB — TYPE AND SCREEN
ABO/RH(D): O POS
Antibody Screen: NEGATIVE
UNIT DIVISION: 0
Unit division: 0
Unit division: 0

## 2014-07-24 LAB — CBC
HEMATOCRIT: 23.8 % — AB (ref 36.0–46.0)
HEMOGLOBIN: 8.4 g/dL — AB (ref 12.0–15.0)
MCH: 32.3 pg (ref 26.0–34.0)
MCHC: 35.3 g/dL (ref 30.0–36.0)
MCV: 91.5 fL (ref 78.0–100.0)
Platelets: 98 10*3/uL — ABNORMAL LOW (ref 150–400)
RBC: 2.6 MIL/uL — AB (ref 3.87–5.11)
RDW: 17 % — ABNORMAL HIGH (ref 11.5–15.5)
WBC: 7.7 10*3/uL (ref 4.0–10.5)

## 2014-07-24 LAB — BASIC METABOLIC PANEL
ANION GAP: 4 — AB (ref 5–15)
BUN: 21 mg/dL (ref 6–23)
CHLORIDE: 102 meq/L (ref 96–112)
CO2: 27 meq/L (ref 19–32)
Calcium: 7.7 mg/dL — ABNORMAL LOW (ref 8.4–10.5)
Creatinine, Ser: 1.14 mg/dL — ABNORMAL HIGH (ref 0.50–1.10)
GFR calc Af Amer: 50 mL/min — ABNORMAL LOW (ref >90)
GFR calc non Af Amer: 44 mL/min — ABNORMAL LOW (ref >90)
Glucose, Bld: 92 mg/dL (ref 70–99)
POTASSIUM: 4 meq/L (ref 3.7–5.3)
Sodium: 133 mEq/L — ABNORMAL LOW (ref 137–147)

## 2014-07-24 LAB — MRSA PCR SCREENING: MRSA by PCR: NEGATIVE

## 2014-07-24 MED ORDER — FERRIC SUBSULFATE 259 MG/GM EX SOLN
CUTANEOUS | Status: AC
  Filled 2014-07-24: qty 8

## 2014-07-24 MED ORDER — FERROUS SULFATE 325 (65 FE) MG PO TABS: 325.0000 mg | ORAL_TABLET | Freq: Three times a day (TID) | ORAL | Status: DC

## 2014-07-24 MED ORDER — AMLODIPINE BESYLATE 5 MG PO TABS: 5.0000 mg | ORAL_TABLET | Freq: Every day | ORAL | Status: DC

## 2014-07-24 MED ORDER — FERROUS SULFATE 325 (65 FE) MG PO TABS: 325.0000 mg | ORAL_TABLET | Freq: Three times a day (TID) | ORAL | Status: AC

## 2014-07-24 MED ORDER — CLONAZEPAM 0.5 MG PO TABS: 0.2500 mg | ORAL_TABLET | Freq: Every day | ORAL | Status: DC

## 2014-07-24 MED ORDER — FENTANYL 12 MCG/HR TD PT72: 12.5000 ug | MEDICATED_PATCH | TRANSDERMAL | Status: DC

## 2014-07-24 MED ORDER — HYDROCODONE-ACETAMINOPHEN 5-325 MG PO TABS: 1.0000 | ORAL_TABLET | ORAL | Status: DC | PRN

## 2014-07-24 NOTE — Clinical Social Work Note (Signed)
CSW followed up with Altha Harm to discuss possible bed offer in [facility]. Altha Harm reports this is too far and she would prefer return to [facility] D/C later today. Facility notified and agreeable. Altha Harm will take pt back this afternoon. Will fax d/c summary when available.   Benay Pike, [place]

## 2014-07-24 NOTE — Progress Notes (Signed)
[name] discharged [facility] per MD order. Report called to [facility], Therapist, sports. Discharge instructions reviewed and discussed with the patient and family at bedside, all questions and concerns answered. Copy of instructions and scripts given to patient's family and is being sent to the facility.    Medication List    STOP taking these medications       lisinopril 10 MG tablet  Commonly known as:  PRINIVIL,ZESTRIL      TAKE these medications       acetaminophen 500 MG tablet  Commonly known as:  TYLENOL  Take 1,000 mg by mouth 3 (three) times daily as needed for mild pain.     amLODipine 5 MG tablet  Commonly known as:  NORVASC  Take 1 tablet (5 mg total) by mouth daily.     clonazePAM 0.5 MG tablet  Commonly known as:  KLONOPIN  Take 0.5 tablets (0.25 mg total) by mouth at bedtime.     fentaNYL 12 MCG/HR  Commonly known as:  DURAGESIC - dosed mcg/hr  Place 1 patch (12.5 mcg total) onto the skin every 3 (three) days.     ferrous sulfate 325 (65 FE) MG tablet  Take 1 tablet (325 mg total) by mouth 3 (three) times daily with meals.     fluticasone 50 MCG/ACT nasal spray  Commonly known as:  FLONASE  Place 2 sprays into both nostrils daily.     HYDROcodone-acetaminophen 5-325 MG per tablet  Commonly known as:  NORCO/VICODIN  Take 1 tablet by mouth every 4 (four) hours as needed (pain).     magnesium hydroxide 400 MG/5ML suspension  Commonly known as:  MILK OF MAGNESIA  Take 30 mLs by mouth daily as needed (bowel mobility).     Melatonin 3 MG Tabs  Take 1 tablet by mouth at bedtime as needed (insomnia).     metroNIDAZOLE 500 MG tablet  Commonly known as:  FLAGYL  Take 1 tablet (500 mg total) by mouth 2 (two) times daily.     multivitamin with minerals tablet  Take 1 tablet by mouth daily.     polyethylene glycol packet  Commonly known as:  MIRALAX / GLYCOLAX  Take 17 g by mouth daily.        Patients skin is clean, dry and intact, no evidence  of skin break down. IV site discontinued and catheter remains intact. Site without signs and symptoms of complications. Dressing and pressure applied.  Patient escorted to car by [name], NT in a wheelchair,  no distress noted upon discharge.  [name] 07/24/2014 2:56 PM

## 2014-07-24 NOTE — Discharge Summary (Signed)
Physician Discharge Summary  [name]:[id number] DOB: [birth year] DOA: 07/22/2014  PCP: No primary provider on file.  Admit date: 07/22/2014 Discharge date: 07/24/2014  Time spent: >30 minutes  Recommendations for Outpatient Follow-up:  Check CBC in 1 week; to follow Hgb trend Check BMET in 1 week to follow electrolytes and renal function Reassess BP and adjust antihypertensive regimen as needed.  Discharge Diagnoses:  Active Problems:   Dementia, unspecified, without behavioral disturbance   Unspecified essential hypertension   Vaginal bleeding   Schizoaffective disorder, unspecified condition   Vagina bleeding   Discharge Condition: stable and improved. Will discharge back to SNF (memory unit)  Diet recommendation: low sodium diet  Filed Weights   07/23/14 0900  Weight: 60.329 kg (133 lb)    History of present illness:  80 year old elderly, demented, schizoaffective lady who presents with vagina bleeding which has been going on for approximately one week. She was seen by gynecology, Dr. Glo Herring 6 days ago and apparently there was a foreign body which was removed and she was started on metronidazole. Unfortunately, she's continued to have bleeding. She now presents in the emergency room. She has been found to have a significant anemia with a hemoglobin of 7.3. She is now being admitted for further management. The patient herself has got dementia and schizoaffective disorder and cannot really give a clear history. However, the daughter, who is at the bedside, confirms this history.   Hospital Course:  1-vaginal bleeding: biopsy by dr. Glo Herring done recently and results demonstrated benign especimen.  -gynecology has been consulted and per recommendation will finalize treatment for cervicitis -avoid insertion of any foreing body in her vagina -treated with Monsel solution to help accelerate healing from biopsy site and decrease chances of bleeding  2-anemia: ABLA (due to  vaginal bleeding) on top of anemia of chronic disease  -s/p 3 units of PRBC's -will start ferrous sulfate TID to help with iron supplementation -follow Hgb trend in 1 week  3-HTN: stable. Will hold lisinopril due to renal failure  -will treat with norvasc instead -advise to follow low sodium diet  4-dementia with behavioral disturbance: continue supportive care and klonopin   5-acute renal failure: due to decrease perfusion with anemia and decrease fluid intake  -UA w/o signs of infection -lisinopril discontinue  -fluid given and renal function pretty much back to baseline at discharge -follow BMET in 1 week  6-chronic pain: continue current PRN analgescis regimen.  Procedures:  See below for x-ray reports   Consultations:  OB/GYN  Discharge Exam: Filed Vitals:   07/24/14 0615  BP: 129/44  Pulse: 66  Temp: 98.3 F (36.8 C)  Resp: 11   General: No complaining of pain or SOB; no fever. Patient is disoriented at bedside and unable to collaborates on ROS or expressing further complaints.  Cardiovascular: S1 and S2, no rubs, no gallops, no murmurs  Respiratory: CTA bilaterally  Abdomen: soft, NT, ND, positive BS  Musculoskeletal: no edema or cyanosis    Discharge Instructions You were cared for by a hospitalist during your hospital stay. If you have any questions about your discharge medications or the care you received while you were in the hospital after you are discharged, you can call the unit and asked to speak with the hospitalist on call if the hospitalist that took care of you is not available. Once you are discharged, your primary care physician will handle any further medical issues. Please note that NO REFILLS for any discharge medications will be  authorized once you are discharged, as it is imperative that you return to your primary care physician (or establish a relationship with a primary care physician if you do not have one) for your aftercare needs so that they  can reassess your need for medications and monitor your lab values.  Discharge Instructions   Diet - low sodium heart healthy    Complete by:  As directed      Discharge instructions    Complete by:  As directed   Take medications as prescribed Maintain good hydration Follow a low sodium diet (less than 2.5 grams daily) Please assess for any further vaginal bleeding and if present contact Dr. Glo Herring Office for immediate evaluation          Current Discharge Medication List    START taking these medications   Details  amLODipine (NORVASC) 5 MG tablet Take 1 tablet (5 mg total) by mouth daily.      CONTINUE these medications which have CHANGED   Details  clonazePAM (KLONOPIN) 0.5 MG tablet Take 0.5 tablets (0.25 mg total) by mouth at bedtime. Qty: 10 tablet, Refills: 0    fentaNYL (DURAGESIC - DOSED MCG/HR) 12 MCG/HR Place 1 patch (12.5 mcg total) onto the skin every 3 (three) days. Qty: 5 patch, Refills: 0    ferrous sulfate 325 (65 FE) MG tablet Take 1 tablet (325 mg total) by mouth 3 (three) times daily with meals.    HYDROcodone-acetaminophen (NORCO/VICODIN) 5-325 MG per tablet Take 1 tablet by mouth every 4 (four) hours as needed (pain). Qty: 20 tablet, Refills: 0      CONTINUE these medications which have NOT CHANGED   Details  fluticasone (FLONASE) 50 MCG/ACT nasal spray Place 2 sprays into both nostrils daily.    metroNIDAZOLE (FLAGYL) 500 MG tablet Take 1 tablet (500 mg total) by mouth 2 (two) times daily. Qty: 14 tablet, Refills: 0    Multiple Vitamins-Minerals (MULTIVITAMIN WITH MINERALS) tablet Take 1 tablet by mouth daily.    polyethylene glycol (MIRALAX / GLYCOLAX) packet Take 17 g by mouth daily.    acetaminophen (TYLENOL) 500 MG tablet Take 1,000 mg by mouth 3 (three) times daily as needed for mild pain.     magnesium hydroxide (MILK OF MAGNESIA) 400 MG/5ML suspension Take 30 mLs by mouth daily as needed (bowel mobility).     Melatonin 3 MG TABS Take  1 tablet by mouth at bedtime as needed (insomnia).       STOP taking these medications     lisinopril (PRINIVIL,ZESTRIL) 10 MG tablet        No Known Allergies Follow-up Information   Follow up with Jonnie Kind, MD On 08/04/2014.   Specialties:  Obstetrics and Gynecology, Radiology   Contact information:   [place] [address] [phone]        The results of significant diagnostics from this hospitalization (including imaging, microbiology, ancillary and laboratory) are listed below for reference.    Significant Diagnostic Studies: US Pelvis Complete  07/24/2014   CLINICAL DATA:  80 year old with postmenopausal bleeding for more than 1 month. The patient had a cervical biopsy on 07/16/2014.  EXAM: TRANSABDOMINAL ULTRASOUND OF PELVIS  TECHNIQUE: Transabdominal ultrasound examination of the pelvis was performed including evaluation of the uterus, ovaries, adnexal regions, and pelvic cul-de-sac.  COMPARISON:  CT of the abdomen and pelvis on 05/27/2012  FINDINGS: Uterus  Measurements: 8.1 x 3.0 x 6.2 cm. No fibroids or other mass visualized.  Endometrium  Thickness: 8.7 mm. There is limited visualization of the endometrium.  Right ovary  Measurements: The ovary is not visualized, likely because of intervening bowel loops.  Left ovary  Measurements: The ovary is not visualized, likely because of intervening bowel loops.  Other findings:  No free fluid  IMPRESSION: 1. Endometrium appears thickened without definite focal mass. There is limited visibility of the endometrium. In the setting of post-menopausal bleeding, endometrial sampling is indicated to exclude carcinoma. If results are benign, sonohysterogram should be considered for focal lesion work-up. (Ref: Radiological Reasoning: Algorithmic Workup of Abnormal Vaginal Bleeding with Endovaginal Sonography and Sonohysterography. AJR 2008; 096:G83-66) 2. Nonvisualized ovaries.   Electronically Signed   By: Shon Hale  M.D.   On: 07/24/2014 10:15    Microbiology: Recent Results (from the past 240 hour(s))  MRSA PCR SCREENING     Status: None   Collection Time    07/24/14 10:00 AM      Result Value Ref Range Status   MRSA by PCR NEGATIVE  NEGATIVE Final   Comment:            The GeneXpert MRSA Assay (FDA     approved for NASAL specimens     only), is one component of a     comprehensive MRSA colonization     surveillance program. It is not     intended to diagnose MRSA     infection nor to guide or     monitor treatment for     MRSA infections.     Labs: Basic Metabolic Panel:  Recent Labs Lab 07/22/14 1531 07/23/14 0526 07/24/14 0543  NA 130* 133* 133*  K 3.9 4.2 4.0  CL 99 103 102  CO2 27 24 27   GLUCOSE 96 107* 92  BUN 13 21 21   CREATININE 0.92 1.43* 1.14*  CALCIUM 8.3* 7.8* 7.7*   Liver Function Tests:  Recent Labs Lab 07/23/14 0526  AST 41*  ALT 31  ALKPHOS 45  BILITOT 1.1  PROT 11.7*  ALBUMIN 2.2*   CBC:  Recent Labs Lab 07/22/14 1531 07/23/14 0526 07/24/14 0543  WBC 5.0 8.4 7.7  NEUTROABS 2.6  --   --   HGB 7.3* 8.3* 8.4*  HCT 21.5* 23.5* 23.8*  MCV 96.0 93.3 91.5  PLT 135* 111* 98*    Signed:  Barton Dubois  [facility] 07/24/2014, 1:30 PM

## 2014-07-24 NOTE — Consult Note (Signed)
Reason for Consult:vaginal bleeding 1 week s/p office removal of foreign body and cervix biopsy Referring Physician: rossetta, [name] is an 80 y.o. female. She was admitted from [facility] 2 days ago due to heavy vaginal bleeding with passage of a clot. She was found to be anemic, was admitted for transfusion of one unit packed cells, and consultation obtained. Since admission she's had no further bleeding. This morning she stable and her normal mental status. She is accompanied by her daughter who is the historian and is a reliable source. One week ago she was seen at [facility] for a month long vaginal bleeding episode, examination revealed a large paper towel vaginal foreign body which was removed and expose the cervix as being swollen erythematous, and biopsy was performed. She was not.bleeding at the time of discharge from that biopsy. The daughter does not report any episodes of heavy vaginal bleeding in the  Nursing home until the day of return to the hospital, but she is not completely sure of the history, as the patient may not have notified anyone, according to the patient's daughter. She was treated with metronidazole orally prior to return to the hospital for the present cervicitis. Biopsy report is returned benign  Pertinent Gynecological History: Menses: post-menopausal Bleeding: post menopausal bleeding Contraception:  DES exposure:  Blood transfusions: One unit packed cells last night Sexually transmitted diseases: no past history Previous GYN Procedures: Cervix biopsy one week ago [facility]  Last mammogram: normal Date: 2012 Last pap: None on record Date: Unknown OB History: G P   Menstrual History: Menarche age:  No LMP recorded. Patient is postmenopausal.    Past Medical History  Diagnosis Date  . Chronic leg pain   . Poor historian   . Psychosis   . Schizoaffective disorder   . Hypertension   . High cholesterol   .  Dementia     Past Surgical History  Procedure Laterality Date  . Tubal ligation    . Foot surgery      History reviewed. No pertinent family history.  Social History:  reports that she has never smoked. She does not have any smokeless tobacco history on file. She reports that she does not drink alcohol or use illicit drugs.  Allergies: No Known Allergies  Medications: I have reviewed the patient's current medications.  ROS  Blood pressure 129/44, pulse 66, temperature 98.3 F (36.8 C), temperature source Oral, resp. rate 11, height _0  (1.575 m), weight 60.329 kg (133 lb), SpO2 99.00%. Physical Exam  Genitourinary: Vagina normal.  Speculum exam reveals normal vaginal is epithelium, with the cervix much less erythematous then last week. Cervicitis is essentially resolved. There is a small clot on the biopsy site, but otherwise speculum exam is unremarkable.   cervix is deviated slightly to the right. Of note, a transvaginal ultrasound done by me at the time of the office visit showed what appeared to be a normal endometrial cavity.  Results for orders placed during the hospital encounter of 07/22/14 (from the past 48 hour(s))  BASIC METABOLIC PANEL     Status: Abnormal   Collection Time    07/22/14  3:31 PM      Result Value Ref Range   Sodium 130 (*) 137 - 147 mEq/L   Potassium 3.9  3.7 - 5.3 mEq/L   Chloride 99  96 - 112 mEq/L   CO2 27  19 - 32 mEq/L   Glucose, Bld 96  70 - 99 mg/dL   BUN 13  6 - 23 mg/dL   Creatinine, Ser 0.92  0.50 - 1.10 mg/dL   Calcium 8.3 (*) 8.4 - 10.5 mg/dL   GFR calc non Af Amer 56 (*) >90 mL/min   GFR calc Af Amer 65 (*) >90 mL/min   Comment: (NOTE)     The eGFR has been calculated using the CKD EPI equation.     This calculation has not been validated in all clinical situations.     eGFR's persistently <90 mL/min signify possible Chronic Kidney     Disease.   Anion gap 4 (*) 5 - 15  CBC WITH DIFFERENTIAL     Status: Abnormal   Collection  Time    07/22/14  3:31 PM      Result Value Ref Range   WBC 5.0  4.0 - 10.5 K/uL   RBC 2.24 (*) 3.87 - 5.11 MIL/uL   Hemoglobin 7.3 (*) 12.0 - 15.0 g/dL   HCT 21.5 (*) 36.0 - 46.0 %   MCV 96.0  78.0 - 100.0 fL   MCH 32.6  26.0 - 34.0 pg   MCHC 34.0  30.0 - 36.0 g/dL   RDW 13.9  11.5 - 15.5 %   Platelets 135 (*) 150 - 400 K/uL   Neutrophils Relative % 52  43 - 77 %   Neutro Abs 2.6  1.7 - 7.7 K/uL   Lymphocytes Relative 37  12 - 46 %   Lymphs Abs 1.9  0.7 - 4.0 K/uL   Monocytes Relative 8  3 - 12 %   Monocytes Absolute 0.4  0.1 - 1.0 K/uL   Eosinophils Relative 2  0 - 5 %   Eosinophils Absolute 0.1  0.0 - 0.7 K/uL   Basophils Relative 1  0 - 1 %   Basophils Absolute 0.0  0.0 - 0.1 K/uL  PROTIME-INR     Status: Abnormal   Collection Time    07/22/14  3:31 PM      Result Value Ref Range   Prothrombin Time 18.7 (*) 11.6 - 15.2 seconds   INR 1.56 (*) 0.00 - 1.49  TYPE AND SCREEN     Status: None   Collection Time    07/22/14  3:31 PM      Result Value Ref Range   ABO/RH(D) O POS     Antibody Screen NEG     Sample Expiration 07/25/2014     Unit Number [id number]     Blood Component Type RED CELLS,LR     Unit division 00     Status of Unit ISSUED,FINAL     Transfusion Status OK TO TRANSFUSE     Crossmatch Result Compatible     Unit Number [id number]     Blood Component Type RED CELLS,LR     Unit division 00     Status of Unit ISSUED,FINAL     Transfusion Status OK TO TRANSFUSE     Crossmatch Result Compatible     Unit Number [id number]     Blood Component Type RED CELLS,LR     Unit division 00     Status of Unit ISSUED,FINAL     Transfusion Status OK TO TRANSFUSE     Crossmatch Result Compatible    PREPARE RBC (CROSSMATCH)     Status: None   Collection Time    07/22/14  3:31 PM      Result Value Ref Range   Order Confirmation ORDER PROCESSED BY BLOOD  BANK    ABO/RH     Status: None   Collection Time    07/22/14  3:31 PM      Result Value Ref Range    ABO/RH(D) O POS    PREPARE RBC (CROSSMATCH)     Status: None   Collection Time    07/22/14  3:31 PM      Result Value Ref Range   Order Confirmation ORDER PROCESSED BY BLOOD BANK    COMPREHENSIVE METABOLIC PANEL     Status: Abnormal   Collection Time    07/23/14  5:26 AM      Result Value Ref Range   Sodium 133 (*) 137 - 147 mEq/L   Potassium 4.2  3.7 - 5.3 mEq/L   Chloride 103  96 - 112 mEq/L   CO2 24  19 - 32 mEq/L   Glucose, Bld 107 (*) 70 - 99 mg/dL   BUN 21  6 - 23 mg/dL   Creatinine, Ser 1.43 (*) 0.50 - 1.10 mg/dL   Comment: DELTA CHECK NOTED   Calcium 7.8 (*) 8.4 - 10.5 mg/dL   Total Protein 11.7 (*) 6.0 - 8.3 g/dL   Albumin 2.2 (*) 3.5 - 5.2 g/dL   AST 41 (*) 0 - 37 U/L   ALT 31  0 - 35 U/L   Alkaline Phosphatase 45  39 - 117 U/L   Total Bilirubin 1.1  0.3 - 1.2 mg/dL   GFR calc non Af Amer 33 (*) >90 mL/min   GFR calc Af Amer 38 (*) >90 mL/min   Comment: (NOTE)     The eGFR has been calculated using the CKD EPI equation.     This calculation has not been validated in all clinical situations.     eGFR's persistently <90 mL/min signify possible Chronic Kidney     Disease.   Anion gap 6  5 - 15  CBC     Status: Abnormal   Collection Time    07/23/14  5:26 AM      Result Value Ref Range   WBC 8.4  4.0 - 10.5 K/uL   RBC 2.52 (*) 3.87 - 5.11 MIL/uL   Hemoglobin 8.3 (*) 12.0 - 15.0 g/dL   HCT 23.5 (*) 36.0 - 46.0 %   MCV 93.3  78.0 - 100.0 fL   MCH 32.9  26.0 - 34.0 pg   MCHC 35.3  30.0 - 36.0 g/dL   RDW 15.5  11.5 - 15.5 %   Platelets 111 (*) 150 - 400 K/uL   Comment: SPECIMEN CHECKED FOR CLOTS     PLATELET COUNT CONFIRMED BY SMEAR  URINALYSIS, ROUTINE W REFLEX MICROSCOPIC     Status: Abnormal   Collection Time    07/23/14  1:30 PM      Result Value Ref Range   Color, Urine YELLOW  YELLOW   APPearance CLEAR  CLEAR   Specific Gravity, Urine 1.015  1.005 - 1.030   pH 5.0  5.0 - 8.0   Glucose, UA NEGATIVE  NEGATIVE mg/dL   Hgb urine dipstick MODERATE (*)  NEGATIVE   Bilirubin Urine NEGATIVE  NEGATIVE   Ketones, ur NEGATIVE  NEGATIVE mg/dL   Protein, ur NEGATIVE  NEGATIVE mg/dL   Urobilinogen, UA 0.2  0.0 - 1.0 mg/dL   Nitrite NEGATIVE  NEGATIVE   Leukocytes, UA NEGATIVE  NEGATIVE  URINE MICROSCOPIC-ADD ON     Status: Abnormal   Collection Time    07/23/14  1:30 PM  Result Value Ref Range   Squamous Epithelial / LPF FEW (*) RARE   WBC, UA 0-2  <3 WBC/hpf   RBC / HPF 0-2  <3 RBC/hpf   Casts HYALINE CASTS (*) NEGATIVE  CBC     Status: Abnormal   Collection Time    07/24/14  5:43 AM      Result Value Ref Range   WBC 7.7  4.0 - 10.5 K/uL   RBC 2.60 (*) 3.87 - 5.11 MIL/uL   Hemoglobin 8.4 (*) 12.0 - 15.0 g/dL   HCT 23.8 (*) 36.0 - 46.0 %   MCV 91.5  78.0 - 100.0 fL   MCH 32.3  26.0 - 34.0 pg   MCHC 35.3  30.0 - 36.0 g/dL   RDW 17.0 (*) 11.5 - 15.5 %   Platelets 98 (*) 150 - 400 K/uL   Comment: SPECIMEN CHECKED FOR CLOTS     CONSISTENT WITH PREVIOUS RESULT  BASIC METABOLIC PANEL     Status: Abnormal   Collection Time    07/24/14  5:43 AM      Result Value Ref Range   Sodium 133 (*) 137 - 147 mEq/L   Potassium 4.0  3.7 - 5.3 mEq/L   Chloride 102  96 - 112 mEq/L   CO2 27  19 - 32 mEq/L   Glucose, Bld 92  70 - 99 mg/dL   BUN 21  6 - 23 mg/dL   Creatinine, Ser 1.14 (*) 0.50 - 1.10 mg/dL   Calcium 7.7 (*) 8.4 - 10.5 mg/dL   GFR calc non Af Amer 44 (*) >90 mL/min   GFR calc Af Amer 50 (*) >90 mL/min   Comment: (NOTE)     The eGFR has been calculated using the CKD EPI equation.     This calculation has not been validated in all clinical situations.     eGFR's persistently <90 mL/min signify possible Chronic Kidney     Disease.   Anion gap 4 (*) 5 - 15    No results found.  A review of prior hemoglobin showed that there is no hemoglobin in the chart since 2013. Over to your timeframe hemoglobin struck 11.7 to admission 7.3 we do not have records in hemoglobin from the nursing home  Assessment/Plan: Assessment 1 bleeding  from cervical biopsy site firm outpatient procedure room one week ago, now treated with Monsel solution to promote coagulation 2. Resolved cervicitis 3. Anemia secondary to apparent significant blood loss versus chronic anemia with superimposed acute  blood loss. The very small biopsy site and the complete absence of any continued bleeding after admission makes me doubt doubt that all the change in her hemoglobin can be explained by the vaginal bleeding episode. Evaluation can be done through nursing home if so desired. 4. I will see the patient as schedule on 22 September for repeat examination. Family is aware that biopsy was benign, and the and then any future bleeding would be considered abnormal and that they should notify the office Thank you for allowing me to see this patient. Jonnie Kind 07/24/2014

## 2014-08-03 ENCOUNTER — Ambulatory Visit: Payer: Medicare Other | Admitting: Obstetrics and Gynecology

## 2014-08-04 ENCOUNTER — Encounter: Payer: Self-pay | Admitting: Obstetrics and Gynecology

## 2014-08-04 ENCOUNTER — Other Ambulatory Visit (HOSPITAL_COMMUNITY)
Admission: RE | Admit: 2014-08-04 | Discharge: 2014-08-04 | Disposition: A | Discharge status: Home / Self Care | Payer: Medicare Other | Source: Ambulatory Visit | Attending: Obstetrics and Gynecology | Admitting: Obstetrics and Gynecology

## 2014-08-04 ENCOUNTER — Ambulatory Visit (INDEPENDENT_AMBULATORY_CARE_PROVIDER_SITE_OTHER): Payer: Medicare Other | Admitting: Obstetrics and Gynecology

## 2014-08-04 VITALS — BP 150/72 | Ht 62.0 in | Wt 129.0 lb

## 2014-08-04 DIAGNOSIS — N898 Other specified noninflammatory disorders of vagina: Secondary | ICD-10-CM

## 2014-08-04 DIAGNOSIS — N939 Abnormal uterine and vaginal bleeding, unspecified: Secondary | ICD-10-CM

## 2014-08-04 DIAGNOSIS — Z124 Encounter for screening for malignant neoplasm of cervix: Secondary | ICD-10-CM | POA: Insufficient documentation

## 2014-08-04 DIAGNOSIS — Z1151 Encounter for screening for human papillomavirus (HPV): Secondary | ICD-10-CM | POA: Insufficient documentation

## 2014-08-04 DIAGNOSIS — R87619 Unspecified abnormal cytological findings in specimens from cervix uteri: Secondary | ICD-10-CM | POA: Insufficient documentation

## 2014-08-04 DIAGNOSIS — N72 Inflammatory disease of cervix uteri: Secondary | ICD-10-CM

## 2014-08-04 NOTE — Progress Notes (Signed)
Patient ID: [name], female   DOB: [birth year], 80 y.o.   MRN: [id number] Pt here today as follow up from hospital admission. Pt has not had anymore bleeding since the episode that cause her to be admitted.  Pt received a unit PRBC while in hospital, and  Is now stable Physical Examination: General appearance - alert, well appearing, and in no distress and normal appearing weight Mental status - alzheimers Pelvic - normal external genitalia, vulva, vagina, cervix, uterus and adnexa, VAGINA: normal appearing vagina with normal color and discharge, no lesions, atrophic, CERVIX: normal appearing cervix without discharge or lesions Biopsy site 95% healed A. Resolved cervicitis s/p foreign body removal P.,Pap done     Fu prn

## 2014-08-04 NOTE — Addendum Note (Signed)
Addended by: Farley Ly on: 08/04/2014 03:01 PM   Modules accepted: Orders

## 2014-08-04 NOTE — Patient Instructions (Signed)
followup your pap by using MYChart. Please register for this.

## 2014-08-06 LAB — CYTOLOGY - PAP

## 2014-08-07 ENCOUNTER — Telehealth: Payer: Self-pay | Admitting: *Deleted

## 2014-08-07 NOTE — Progress Notes (Signed)
UR chart review completed.  

## 2014-08-07 NOTE — Telephone Encounter (Signed)
Message copied by Doyne Keel on Fri Aug 07, 2014  8:35 AM ------      Message from: Jonnie Kind      Created: Fri Aug 07, 2014  8:28 AM       Ascus with negative HPV, needs repeat pap in 1 yr, and to be notified of such and put on recall list ------

## 2014-08-07 NOTE — Telephone Encounter (Signed)
Pt resident at the [facility], [place], [place], informed of pap results from 08/04/2014, ASCUS. Repeat Pap in 1 year.

## 2014-09-02 ENCOUNTER — Ambulatory Visit: Payer: Medicare Other | Admitting: Obstetrics and Gynecology

## 2014-09-24 ENCOUNTER — Encounter (HOSPITAL_COMMUNITY)
Admission: RE | Admit: 2014-09-24 | Discharge: 2014-09-24 | Disposition: A | Discharge status: Home / Self Care | Payer: Medicare Other | Source: Ambulatory Visit | Attending: Family Medicine | Admitting: Family Medicine

## 2014-09-24 ENCOUNTER — Encounter (HOSPITAL_COMMUNITY): Payer: Self-pay

## 2014-09-24 DIAGNOSIS — N939 Abnormal uterine and vaginal bleeding, unspecified: Secondary | ICD-10-CM | POA: Insufficient documentation

## 2014-09-24 LAB — HEMOGLOBIN AND HEMATOCRIT, BLOOD
HCT: 22.7 % — ABNORMAL LOW (ref 36.0–46.0)
Hemoglobin: 8 g/dL — ABNORMAL LOW (ref 12.0–15.0)

## 2014-09-24 LAB — PREPARE RBC (CROSSMATCH)

## 2014-09-24 MED ORDER — SODIUM CHLORIDE 0.9 % IV SOLN
Freq: Once | INTRAVENOUS | Status: AC
  Administered 2014-09-24: 250 mL via INTRAVENOUS

## 2014-09-24 NOTE — Discharge Instructions (Signed)

## 2014-09-24 NOTE — Progress Notes (Signed)
Results for LEHUA, FLORES (MRN [id number]) as of 09/24/2014 09:04 H&H prior to blood transfusion.  Ref. Range 09/24/2014 08:20  Hemoglobin Latest Range: 12.0-15.0 g/dL 8.0 (L)  HCT Latest Range: 36.0-46.0 % 22.7 (L)

## 2014-09-25 LAB — TYPE AND SCREEN
ABO/RH(D): O POS
Antibody Screen: NEGATIVE
UNIT DIVISION: 0
Unit division: 0

## 2014-11-09 ENCOUNTER — Encounter (HOSPITAL_COMMUNITY): Payer: Self-pay | Admitting: Emergency Medicine

## 2014-11-09 ENCOUNTER — Inpatient Hospital Stay (HOSPITAL_COMMUNITY)
Admission: EM | Admit: 2014-11-09 | Discharge: 2014-11-10 | DRG: 812 | Disposition: A | Discharge status: Home / Self Care | Payer: Medicare Other | Attending: Family Medicine | Admitting: Family Medicine

## 2014-11-09 ENCOUNTER — Emergency Department (HOSPITAL_COMMUNITY): Payer: Medicare Other

## 2014-11-09 DIAGNOSIS — D649 Anemia, unspecified: Secondary | ICD-10-CM | POA: Diagnosis present

## 2014-11-09 DIAGNOSIS — J811 Chronic pulmonary edema: Secondary | ICD-10-CM | POA: Diagnosis present

## 2014-11-09 DIAGNOSIS — E780 Pure hypercholesterolemia: Secondary | ICD-10-CM | POA: Diagnosis present

## 2014-11-09 DIAGNOSIS — D696 Thrombocytopenia, unspecified: Secondary | ICD-10-CM | POA: Diagnosis present

## 2014-11-09 DIAGNOSIS — I10 Essential (primary) hypertension: Secondary | ICD-10-CM | POA: Diagnosis present

## 2014-11-09 DIAGNOSIS — N72 Inflammatory disease of cervix uteri: Secondary | ICD-10-CM | POA: Diagnosis present

## 2014-11-09 DIAGNOSIS — F259 Schizoaffective disorder, unspecified: Secondary | ICD-10-CM

## 2014-11-09 DIAGNOSIS — F0390 Unspecified dementia without behavioral disturbance: Secondary | ICD-10-CM

## 2014-11-09 DIAGNOSIS — R778 Other specified abnormalities of plasma proteins: Secondary | ICD-10-CM

## 2014-11-09 HISTORY — DX: Anemia, unspecified: D64.9

## 2014-11-09 HISTORY — DX: Thrombocytopenia, unspecified: D69.6

## 2014-11-09 HISTORY — DX: Other specified abnormalities of plasma proteins: R77.8

## 2014-11-09 LAB — CBC WITH DIFFERENTIAL/PLATELET
BASOS ABS: 0 10*3/uL (ref 0.0–0.1)
Basophils Relative: 0 % (ref 0–1)
Eosinophils Absolute: 0.1 10*3/uL (ref 0.0–0.7)
Eosinophils Relative: 2 % (ref 0–5)
HEMATOCRIT: 21.2 % — AB (ref 36.0–46.0)
Hemoglobin: 7.2 g/dL — ABNORMAL LOW (ref 12.0–15.0)
Lymphocytes Relative: 31 % (ref 12–46)
Lymphs Abs: 1.8 10*3/uL (ref 0.7–4.0)
MCH: 33.8 pg (ref 26.0–34.0)
MCHC: 34 g/dL (ref 30.0–36.0)
MCV: 99.5 fL (ref 78.0–100.0)
MONOS PCT: 8 % (ref 3–12)
Monocytes Absolute: 0.4 10*3/uL (ref 0.1–1.0)
NEUTROS ABS: 3.4 10*3/uL (ref 1.7–7.7)
NEUTROS PCT: 59 % (ref 43–77)
Platelets: DECREASED 10*3/uL (ref 150–400)
RBC: 2.13 MIL/uL — AB (ref 3.87–5.11)
RDW: 14.2 % (ref 11.5–15.5)
WBC: 5.8 10*3/uL (ref 4.0–10.5)

## 2014-11-09 LAB — BASIC METABOLIC PANEL
ANION GAP: 0 — AB (ref 5–15)
BUN: 26 mg/dL — ABNORMAL HIGH (ref 6–23)
CHLORIDE: 108 meq/L (ref 96–112)
CO2: 28 mmol/L (ref 19–32)
Calcium: 7.9 mg/dL — ABNORMAL LOW (ref 8.4–10.5)
Creatinine, Ser: 1.18 mg/dL — ABNORMAL HIGH (ref 0.50–1.10)
GFR calc Af Amer: 48 mL/min — ABNORMAL LOW (ref >90)
GFR calc non Af Amer: 42 mL/min — ABNORMAL LOW (ref >90)
Glucose, Bld: 99 mg/dL (ref 70–99)
POTASSIUM: 3.4 mmol/L — AB (ref 3.5–5.1)
Sodium: 135 mmol/L (ref 135–145)

## 2014-11-09 LAB — PREPARE RBC (CROSSMATCH)

## 2014-11-09 MED ORDER — ONDANSETRON HCL 4 MG/2ML IJ SOLN: 4.0000 mg | Freq: Four times a day (QID) | INTRAMUSCULAR | Status: DC | PRN

## 2014-11-09 MED ORDER — POLYETHYLENE GLYCOL 3350 17 G PO PACK: 17.0000 g | PACK | Freq: Every day | ORAL | Status: DC

## 2014-11-09 MED ORDER — CLONAZEPAM 0.25 MG PO TBDP: 0.2500 mg | ORAL_TABLET | Freq: Every day | ORAL | Status: DC

## 2014-11-09 MED ORDER — SODIUM CHLORIDE 0.9 % IV SOLN: INTRAVENOUS | Status: DC

## 2014-11-09 MED ORDER — ACETAMINOPHEN 500 MG PO TABS: 500.0000 mg | ORAL_TABLET | Freq: Three times a day (TID) | ORAL | Status: DC | PRN

## 2014-11-09 MED ORDER — FENTANYL 12 MCG/HR TD PT72
12.5000 ug | MEDICATED_PATCH | TRANSDERMAL | Status: DC
  Administered 2014-11-09: 12.5 ug via TRANSDERMAL
  Filled 2014-11-09: qty 1

## 2014-11-09 MED ORDER — MELATONIN 3 MG PO TABS: 1.0000 | ORAL_TABLET | Freq: Every evening | ORAL | Status: DC | PRN

## 2014-11-09 MED ORDER — POLYETHYLENE GLYCOL 3350 17 G PO PACK
17.0000 g | PACK | Freq: Every day | ORAL | Status: DC
  Administered 2014-11-10: 17 g via ORAL
  Filled 2014-11-09: qty 1

## 2014-11-09 MED ORDER — MAGNESIUM HYDROXIDE 400 MG/5ML PO SUSP
30.0000 mL | Freq: Every day | ORAL | Status: DC | PRN
Start: 2014-11-09 — End: 2014-11-10

## 2014-11-09 MED ORDER — FLUTICASONE PROPIONATE 50 MCG/ACT NA SUSP
2.0000 | Freq: Every day | NASAL | Status: DC
  Administered 2014-11-10: 2 via NASAL
  Filled 2014-11-09 (×2): qty 16

## 2014-11-09 MED ORDER — SODIUM CHLORIDE 0.9 % IV SOLN
10.0000 mL/h | Freq: Once | INTRAVENOUS | Status: AC
  Administered 2014-11-09: 10 mL/h via INTRAVENOUS

## 2014-11-09 MED ORDER — ADULT MULTIVITAMIN W/MINERALS CH
1.0000 | ORAL_TABLET | Freq: Every day | ORAL | Status: DC
  Administered 2014-11-10: 1 via ORAL
  Filled 2014-11-09: qty 1

## 2014-11-09 MED ORDER — ONDANSETRON HCL 4 MG PO TABS: 4.0000 mg | ORAL_TABLET | Freq: Four times a day (QID) | ORAL | Status: DC | PRN

## 2014-11-09 MED ORDER — SODIUM CHLORIDE 0.9 % IV SOLN
INTRAVENOUS | Status: DC
  Administered 2014-11-09: 18:00:00 via INTRAVENOUS

## 2014-11-09 MED ORDER — LORAZEPAM 0.5 MG PO TABS
0.5000 mg | ORAL_TABLET | Freq: Once | ORAL | Status: AC
  Administered 2014-11-09: 0.5 mg via ORAL
  Filled 2014-11-09: qty 1

## 2014-11-09 NOTE — H&P (Signed)
[facility] History and Physical  [name] HCW:[id number] DOB: 04/13/1930 DOA: 11/09/2014  Referring physician: ER PCP: [name], DO   Chief Complaint: Weakness  HPI: [name] is a 80 y.o. female  This is an 80 year old demented, schizoaffective lady who presents with abnormal hemoglobin found at [facility], where she lives. Her hemoglobin was reportedly 6.8. She did have a previous admission in September when she presented with vagina bleeding which had been going on for approximately one week and at this time she presented with hemoglobin of 7.3. She underwent blood transfusion and it was felt that she had cervicitis secondary to a foreign body which was removed by gynecology. She has been doing reasonably well since that time. She denies any rectal bleeding, hematemesis, abdominal discomfort or pain. Today, her hemoglobin was 7.2. She is now being admitted for further management.   Review of Systems:  Apart from symptoms mentioned above, all other systems are negative..   Past Medical History  Diagnosis Date  . Chronic leg pain   . Poor historian   . Psychosis   . Schizoaffective disorder   . Hypertension   . High cholesterol   . Dementia    Past Surgical History  Procedure Laterality Date  . Tubal ligation    . Foot surgery     Social History:  reports that she has never smoked. She does not have any smokeless tobacco history on file. She reports that she does not drink alcohol or use illicit drugs.  No Known Allergies  Family history: No history of bleeding disorders.   Prior to Admission medications   Medication Sig Start Date End Date Taking? Authorizing Provider  clonazePAM (KLONOPIN) 0.25 MG disintegrating tablet Take 0.25 mg by mouth at bedtime.   Yes Historical Provider, MD  fentaNYL (DURAGESIC - DOSED MCG/HR) 12 MCG/HR Place 1 patch (12.5 mcg total) onto the skin every 3 (three) days. 07/24/14  Yes Barton Dubois, MD  ferrous sulfate 325  (65 FE) MG tablet Take 1 tablet (325 mg total) by mouth 3 (three) times daily with meals. 07/24/14  Yes Barton Dubois, MD  fluticasone [facility]) 50 MCG/ACT nasal spray Place 2 sprays into both nostrils daily.   Yes Historical Provider, MD  magnesium hydroxide (MILK OF MAGNESIA) 400 MG/5ML suspension Take 30 mLs by mouth daily as needed (bowel mobility).    Yes Historical Provider, MD  Melatonin 3 MG TABS Take 1 tablet by mouth at bedtime as needed (insomnia).    Yes Historical Provider, MD  Multiple Vitamins-Minerals (MULTIVITAMIN WITH MINERALS) tablet Take 1 tablet by mouth daily.   Yes Historical Provider, MD  polyethylene glycol (MIRALAX / GLYCOLAX) packet Take 17 g by mouth daily.   Yes Historical Provider, MD  acetaminophen (TYLENOL) 500 MG tablet Take 500 mg by mouth 3 (three) times daily as needed for mild pain.     Historical Provider, MD  amLODipine (NORVASC) 5 MG tablet Take 1 tablet (5 mg total) by mouth daily. Patient not taking: Reported on 11/09/2014 07/24/14   Barton Dubois, MD  clonazePAM (KLONOPIN) 0.5 MG tablet Take 0.5 tablets (0.25 mg total) by mouth at bedtime. Patient not taking: Reported on 11/09/2014 07/24/14   Barton Dubois, MD  HYDROcodone-acetaminophen (NORCO/VICODIN) 5-325 MG per tablet Take 1 tablet by mouth every 4 (four) hours as needed (pain). Patient not taking: Reported on 11/09/2014 07/24/14   Barton Dubois, MD  metroNIDAZOLE (FLAGYL) 500 MG tablet Take 1 tablet (500 mg total) by mouth 2 (two)  times daily. Patient not taking: Reported on 11/09/2014 07/16/14   Jonnie Kind, MD   Physical Exam: Filed Vitals:   11/09/14 1758 11/09/14 1826 11/09/14 1830 11/09/14 1850  BP: 143/64 176/92 163/86 148/77  Pulse: 87 76 77 80  Temp: 98.9 F (37.2 C) 98.2 F (36.8 C)  98.3 F (36.8 C)  TempSrc: Rectal     Resp: 20 20 16 18   Height:      Weight:      SpO2: 98% 96% 95% 97%    Wt Readings from Last 3 Encounters:  11/09/14 58.968 kg (130 lb)  09/24/14 58.514 kg (129  lb)  08/04/14 58.514 kg (129 lb)    General:  Appears calm and comfortable. Looks somewhat pale. Alert. Eyes: PERRL, normal lids, irises & conjunctiva ENT: grossly normal hearing, lips & tongue Neck: no LAD, masses or thyromegaly Cardiovascular: RRR, no m/r/g. No LE edema. Telemetry: SR, no arrhythmias  Respiratory: CTA bilaterally, no w/r/r. Normal respiratory effort. Abdomen: soft, ntnd Skin: no rash or induration seen on limited exam Musculoskeletal: grossly normal tone BUE/BLE Psychiatric: grossly normal mood and affect, speech fluent and appropriate Neurologic: grossly non-focal.          Labs on Admission:  Basic Metabolic Panel:  Recent Labs Lab 11/09/14 1523  NA 135  K 3.4*  CL 108  CO2 28  GLUCOSE 99  BUN 26*  CREATININE 1.18*  CALCIUM 7.9*   Liver Function Tests: No results for input(s): AST, ALT, ALKPHOS, BILITOT, PROT, ALBUMIN in the last 168 hours. No results for input(s): LIPASE, AMYLASE in the last 168 hours. No results for input(s): AMMONIA in the last 168 hours. CBC:  Recent Labs Lab 11/09/14 1523  WBC 5.8  NEUTROABS 3.4  HGB 7.2*  HCT 21.2*  MCV 99.5  PLT PLATELET CLUMPS NOTED ON SMEAR, COUNT APPEARS DECREASED   Cardiac Enzymes: No results for input(s): CKTOTAL, CKMB, CKMBINDEX, TROPONINI in the last 168 hours.  BNP (last 3 results) No results for input(s): PROBNP in the last 8760 hours. CBG: No results for input(s): GLUCAP in the last 168 hours.  Radiological Exams on Admission: Dg Chest Port 1 View  11/09/2014   CLINICAL DATA:  Dementia.  EXAM: PORTABLE CHEST - 1 VIEW  COMPARISON:  05/27/2012  FINDINGS: Multiple leads overlie the patient. Enlarged cardiac and mediastinal contours. Bilateral predominantly perihilar interstitial pulmonary opacities. Additionally there is more focal consolidation versus nodular opacity within the left lung base. No pleural effusion or pneumothorax.  IMPRESSION: Cardiomegaly. Perihilar interstitial  pulmonary opacities suggestive of interstitial pulmonary edema.  More focal consolidation versus nodular opacity within the left lung base. This may represent atelectasis or infection. Short-term radiographic followup is recommended to ensure resolution and exclude underlying pulmonary nodule.   Electronically Signed   By: Lovey Newcomer M.D.   On: 11/09/2014 18:31      Assessment/Plan   1. Anemia, normocytic, unclear etiology. Patient is on iron pills. There is no evidence of any acute GI bleed, stool is guaiac negative. There is no obvious vagina bleeding. Since she is symptomatic, we will transfuse her. We will repeat her hemoglobin tomorrow and if it is stable, I'm not sure any further investigation is warranted at this point in time unless she has other symptoms to indicate acute bleeding. 2. Mild to moderate dementia. 3. Schizoaffective disorder, unspecified type, appears to be stable. 4. Hypertension, stable.  Further recommendations will depend on patient's hospital progress.   Code Status: Full code.   DVT  Prophylaxis: SCDs.  Family Communication: I discussed the plan with the patient and patient's daughter at the bedside.   Disposition Plan: Back to the [facility] when medically stable, possibly tomorrow.  Time spent: 60 minutes.  Doree Albee [facility] Pager [phone].

## 2014-11-09 NOTE — Progress Notes (Signed)

## 2014-11-09 NOTE — ED Notes (Signed)
Pt sent from [facility] in [place] for HGB-6.8. Pt has dementia, alert and oriented to person and place. No complaints.

## 2014-11-09 NOTE — ED Provider Notes (Signed)
CSN: [id number]     Arrival date & time 11/09/14  1427 History   First MD Initiated Contact with Patient 11/09/14 1438     Chief Complaint  Patient presents with  . Abnormal Lab     (Consider location/radiation/quality/duration/timing/severity/associated sxs/prior Treatment) The history is provided by the EMS personnel and the nursing home. The history is limited by the condition of the patient.   80 year old female sent in from [facility]. Patient has a history of dementia. Center and for hemoglobin of 6.8. Patient back in November was admitted for hemoglobin of 8 and was transfused this was felt to be due to vaginal bleeding at that time. Patient denies any problems at all or any symptoms at all. Level V caveat applies due to her dementia.  Past Medical History  Diagnosis Date  . Chronic leg pain   . Poor historian   . Psychosis   . Schizoaffective disorder   . Hypertension   . High cholesterol   . Dementia    Past Surgical History  Procedure Laterality Date  . Tubal ligation    . Foot surgery     No family history on file. History  Substance Use Topics  . Smoking status: Never Smoker   . Smokeless tobacco: Not on file  . Alcohol Use: No   OB History    No data available     Review of Systems  Unable to perform ROS  level V caveat applies due to patient's dementia.   Allergies  Review of patient's allergies indicates no known allergies.  Home Medications   Prior to Admission medications   Medication Sig Start Date End Date Taking? Authorizing Provider  clonazePAM (KLONOPIN) 0.25 MG disintegrating tablet Take 0.25 mg by mouth at bedtime.   Yes Historical Provider, MD  fentaNYL (DURAGESIC - DOSED MCG/HR) 12 MCG/HR Place 1 patch (12.5 mcg total) onto the skin every 3 (three) days. 07/24/14  Yes Barton Dubois, MD  ferrous sulfate 325 (65 FE) MG tablet Take 1 tablet (325 mg total) by mouth 3 (three) times daily with meals. 07/24/14  Yes Barton Dubois, MD   fluticasone [facility]) 50 MCG/ACT nasal spray Place 2 sprays into both nostrils daily.   Yes Historical Provider, MD  magnesium hydroxide (MILK OF MAGNESIA) 400 MG/5ML suspension Take 30 mLs by mouth daily as needed (bowel mobility).    Yes Historical Provider, MD  Melatonin 3 MG TABS Take 1 tablet by mouth at bedtime as needed (insomnia).    Yes Historical Provider, MD  Multiple Vitamins-Minerals (MULTIVITAMIN WITH MINERALS) tablet Take 1 tablet by mouth daily.   Yes Historical Provider, MD  polyethylene glycol (MIRALAX / GLYCOLAX) packet Take 17 g by mouth daily.   Yes Historical Provider, MD  acetaminophen (TYLENOL) 500 MG tablet Take 500 mg by mouth 3 (three) times daily as needed for mild pain.     Historical Provider, MD  amLODipine (NORVASC) 5 MG tablet Take 1 tablet (5 mg total) by mouth daily. Patient not taking: Reported on 11/09/2014 07/24/14   Barton Dubois, MD  clonazePAM (KLONOPIN) 0.5 MG tablet Take 0.5 tablets (0.25 mg total) by mouth at bedtime. Patient not taking: Reported on 11/09/2014 07/24/14   Barton Dubois, MD  HYDROcodone-acetaminophen (NORCO/VICODIN) 5-325 MG per tablet Take 1 tablet by mouth every 4 (four) hours as needed (pain). Patient not taking: Reported on 11/09/2014 07/24/14   Barton Dubois, MD  metroNIDAZOLE (FLAGYL) 500 MG tablet Take 1 tablet (500 mg total) by mouth 2 (two)  times daily. Patient not taking: Reported on 11/09/2014 07/16/14   Jonnie Kind, MD   BP 143/64 mmHg  Pulse 87  Temp(Src) 98.9 F (37.2 C) (Rectal)  Resp 20  Ht 5\' 6"  (1.676 m)  Wt 130 lb (58.968 kg)  BMI 20.99 kg/m2  SpO2 98% Physical Exam  Constitutional: She appears well-developed and well-nourished. No distress.  HENT:  Head: Normocephalic and atraumatic.  Eyes: Conjunctivae and EOM are normal. Pupils are equal, round, and reactive to light.  Neck: Normal range of motion.  Cardiovascular: Normal rate, regular rhythm and normal heart sounds.   No murmur heard. Pulmonary/Chest:  Effort normal and breath sounds normal. No respiratory distress.  Abdominal: Soft. Bowel sounds are normal. There is no tenderness.  Genitourinary:  Stool Hemoccult negative. No evidence of rectal or vaginal bleeding.  Musculoskeletal: Normal range of motion. She exhibits no edema.  Neurological: She is alert. No cranial nerve deficit. She exhibits normal muscle tone. Coordination normal.  Skin: Skin is warm. No rash noted.  Nursing note and vitals reviewed.   ED Course  Procedures (including critical care time) Labs Review Labs Reviewed  CBC WITH DIFFERENTIAL - Abnormal; Notable for the following:    RBC 2.13 (*)    Hemoglobin 7.2 (*)    HCT 21.2 (*)    All other components within normal limits  BASIC METABOLIC PANEL - Abnormal; Notable for the following:    Potassium 3.4 (*)    BUN 26 (*)    Creatinine, Ser 1.18 (*)    Calcium 7.9 (*)    GFR calc non Af Amer 42 (*)    GFR calc Af Amer 48 (*)    Anion gap 0.0 (*)    All other components within normal limits  TYPE AND SCREEN  PREPARE RBC (CROSSMATCH)   Results for orders placed or performed during the hospital encounter of 11/09/14  CBC with Differential  Result Value Ref Range   WBC 5.8 4.0 - 10.5 K/uL   RBC 2.13 (L) 3.87 - 5.11 MIL/uL   Hemoglobin 7.2 (L) 12.0 - 15.0 g/dL   HCT 21.2 (L) 36.0 - 46.0 %   MCV 99.5 78.0 - 100.0 fL   MCH 33.8 26.0 - 34.0 pg   MCHC 34.0 30.0 - 36.0 g/dL   RDW 14.2 11.5 - 15.5 %   Platelets  150 - 400 K/uL    PLATELET CLUMPS NOTED ON SMEAR, COUNT APPEARS DECREASED   Neutrophils Relative % 59 43 - 77 %   Neutro Abs 3.4 1.7 - 7.7 K/uL   Lymphocytes Relative 31 12 - 46 %   Lymphs Abs 1.8 0.7 - 4.0 K/uL   Monocytes Relative 8 3 - 12 %   Monocytes Absolute 0.4 0.1 - 1.0 K/uL   Eosinophils Relative 2 0 - 5 %   Eosinophils Absolute 0.1 0.0 - 0.7 K/uL   Basophils Relative 0 0 - 1 %   Basophils Absolute 0.0 0.0 - 0.1 K/uL  Basic metabolic panel  Result Value Ref Range   Sodium 135 135 - 145  mmol/L   Potassium 3.4 (L) 3.5 - 5.1 mmol/L   Chloride 108 96 - 112 mEq/L   CO2 28 19 - 32 mmol/L   Glucose, Bld 99 70 - 99 mg/dL   BUN 26 (H) 6 - 23 mg/dL   Creatinine, Ser 1.18 (H) 0.50 - 1.10 mg/dL   Calcium 7.9 (L) 8.4 - 10.5 mg/dL   GFR calc non Af Amer 42 (L) >90  mL/min   GFR calc Af Amer 48 (L) >90 mL/min   Anion gap 0.0 (L) 5 - 15  Type and screen  Result Value Ref Range   ABO/RH(D) O POS    Antibody Screen NEG    Sample Expiration 11/12/2014    Unit Number [id number]    Blood Component Type RED CELLS,LR    Unit division 00    Status of Unit ALLOCATED    Transfusion Status OK TO TRANSFUSE    Crossmatch Result Compatible    Unit Number [id number]    Blood Component Type RED CELLS,LR    Unit division 00    Status of Unit ISSUED    Transfusion Status OK TO TRANSFUSE    Crossmatch Result Compatible   Prepare RBC  Result Value Ref Range   Order Confirmation ORDER PROCESSED BY BLOOD BANK      Imaging Review No results found.   EKG Interpretation   Date/Time:  Monday November 09 2014 14:35:15 EST Ventricular Rate:  82 PR Interval:  148 QRS Duration: 85 QT Interval:  395 QTC Calculation: 461 R Axis:   37 Text Interpretation:  Sinus rhythm Borderline T abnormalities, inferior  leads Minimal ST elevation, anterior leads No significant change since  last tracing Confirmed by Hailley Byers  MD, [place] [phone]) on 11/09/2014  3:22:00 PM      CRITICAL CARE Performed by: Fredia Sorrow Total critical care time: 30 Critical care time was exclusive of separately billable procedures and treating other patients. Critical care was necessary to treat or prevent imminent or life-threatening deterioration. Critical care was time spent personally by me on the following activities: development of treatment plan with patient and/or surrogate as well as nursing, discussions with consultants, evaluation of patient's response to treatment, examination of patient, obtaining  history from patient or surrogate, ordering and performing treatments and interventions, ordering and review of laboratory studies, ordering and review of radiographic studies, pulse oximetry and re-evaluation of patient's condition.      MDM   Final diagnoses:  Anemia    Patient with history of dementia however sent in from [facility] with hemoglobin of 6.8. Hemoglobin here is verified is below 8. Patient will benefit from transfusion. Blood transfusion ordered. We'll discuss with hospitalist about admission. Patient's rectal exam was heme-negative.     Fredia Sorrow, MD 11/09/14 [phone]

## 2014-11-10 ENCOUNTER — Encounter (HOSPITAL_COMMUNITY): Payer: Self-pay | Admitting: Family Medicine

## 2014-11-10 DIAGNOSIS — R799 Abnormal finding of blood chemistry, unspecified: Secondary | ICD-10-CM

## 2014-11-10 DIAGNOSIS — R778 Other specified abnormalities of plasma proteins: Secondary | ICD-10-CM

## 2014-11-10 DIAGNOSIS — D696 Thrombocytopenia, unspecified: Secondary | ICD-10-CM

## 2014-11-10 DIAGNOSIS — D649 Anemia, unspecified: Secondary | ICD-10-CM

## 2014-11-10 HISTORY — DX: Other specified abnormalities of plasma proteins: R77.8

## 2014-11-10 HISTORY — DX: Anemia, unspecified: D64.9

## 2014-11-10 HISTORY — DX: Thrombocytopenia, unspecified: D69.6

## 2014-11-10 LAB — FERRITIN: FERRITIN: 368 ng/mL — AB (ref 10–291)

## 2014-11-10 LAB — COMPREHENSIVE METABOLIC PANEL
ALT: 9 U/L (ref 0–35)
AST: 12 U/L (ref 0–37)
Albumin: 2.1 g/dL — ABNORMAL LOW (ref 3.5–5.2)
Alkaline Phosphatase: 42 U/L (ref 39–117)
BUN: 23 mg/dL (ref 6–23)
CO2: 26 mmol/L (ref 19–32)
CREATININE: 1.03 mg/dL (ref 0.50–1.10)
Calcium: 8 mg/dL — ABNORMAL LOW (ref 8.4–10.5)
Chloride: 107 mEq/L (ref 96–112)
GFR, EST AFRICAN AMERICAN: 57 mL/min — AB (ref >90)
GFR, EST NON AFRICAN AMERICAN: 49 mL/min — AB (ref >90)
GLUCOSE: 81 mg/dL (ref 70–99)
POTASSIUM: 3.6 mmol/L (ref 3.5–5.1)
Sodium: 134 mmol/L — ABNORMAL LOW (ref 135–145)
Total Bilirubin: 0.5 mg/dL (ref 0.3–1.2)
Total Protein: 11.2 g/dL — ABNORMAL HIGH (ref 6.0–8.3)

## 2014-11-10 LAB — CBC
HCT: 23.1 % — ABNORMAL LOW (ref 36.0–46.0)
HCT: 26.4 % — ABNORMAL LOW (ref 36.0–46.0)
HEMOGLOBIN: 8.9 g/dL — AB (ref 12.0–15.0)
Hemoglobin: 7.6 g/dL — ABNORMAL LOW (ref 12.0–15.0)
MCH: 31.9 pg (ref 26.0–34.0)
MCH: 31.8 pg (ref 26.0–34.0)
MCHC: 32.9 g/dL (ref 30.0–36.0)
MCHC: 33.7 g/dL (ref 30.0–36.0)
MCV: 97.1 fL (ref 78.0–100.0)
MCV: 94.3 fL (ref 78.0–100.0)
PLATELETS: 91 10*3/uL — AB (ref 150–400)
PLATELETS: 98 10*3/uL — AB (ref 150–400)
RBC: 2.38 MIL/uL — AB (ref 3.87–5.11)
RBC: 2.8 MIL/uL — ABNORMAL LOW (ref 3.87–5.11)
RDW: 15.5 % (ref 11.5–15.5)
RDW: 16.9 % — ABNORMAL HIGH (ref 11.5–15.5)
WBC: 5.6 10*3/uL (ref 4.0–10.5)
WBC: 5.9 10*3/uL (ref 4.0–10.5)

## 2014-11-10 LAB — RETICULOCYTES
RBC.: 2.36 MIL/uL — ABNORMAL LOW (ref 3.87–5.11)
RETIC CT PCT: 1.4 % (ref 0.4–3.1)
Retic Count, Absolute: 33 10*3/uL (ref 19.0–186.0)

## 2014-11-10 LAB — VITAMIN B12: VITAMIN B 12: 774 pg/mL (ref 211–911)

## 2014-11-10 LAB — MRSA PCR SCREENING: MRSA BY PCR: NEGATIVE

## 2014-11-10 LAB — FOLATE: Folate: 8.8 ng/mL

## 2014-11-10 LAB — IRON AND TIBC
Iron: 43 ug/dL (ref 42–145)
Saturation Ratios: 44 % (ref 20–55)
TIBC: 98 ug/dL — ABNORMAL LOW (ref 250–470)
UIBC: 55 ug/dL — ABNORMAL LOW (ref 125–400)

## 2014-11-10 LAB — OCCULT BLOOD, POC DEVICE: Fecal Occult Bld: NEGATIVE

## 2014-11-10 MED ORDER — SODIUM CHLORIDE 0.9 % IV SOLN
Freq: Once | INTRAVENOUS | Status: AC
  Administered 2014-11-10: 08:00:00 via INTRAVENOUS

## 2014-11-10 MED ORDER — FUROSEMIDE 10 MG/ML IJ SOLN
20.0000 mg | Freq: Once | INTRAMUSCULAR | Status: AC
  Administered 2014-11-10: 20 mg via INTRAVENOUS
  Filled 2014-11-10: qty 2

## 2014-11-10 MED ORDER — CLONAZEPAM 0.5 MG PO TABS: 0.2500 mg | ORAL_TABLET | Freq: Every day | ORAL | Status: DC

## 2014-11-10 NOTE — Discharge Summary (Signed)
Physician Discharge Summary  [name] UKG:[id number] DOB: 02-14-1930 DOA: 11/09/2014  PCP: [name], DO  Admit date: 11/09/2014 Discharge date: 11/10/2014  Recommendations for Outpatient Follow-up:  1. Chronic normocytic anemia, thrombocytopenia, elevated total protein of unclear significance. Arrangements have been made for follow-up with hematology in GI. No evidence to suggest recurrent vaginal bleeding. 2. Suggest weekly CBC. 3. Monitor for bleeding. 4. Nodular opacity left lung base seen on chest x-ray without evidence of infection. Consider repeat chest x-ray or follow-up CT in 2-4 weeks to assess for persistence.   Follow-up Information    Follow up with Molli Hazard, MD On 11/04/2015.   Specialties:  Internal Medicine, Oncology   Why:  2 PM   Contact information:   [place] [address] [phone]       Follow up with Ranae Pila, NP On 12/08/2014.   Specialty:  Nurse Practitioner   Why:  10 AM    Contact information:   [address] [place] [address] [phone]       Follow up with [name], DO In 1 week.   Specialty:  Family Medicine   Contact information:   [place]  [address] [phone]      Discharge Diagnoses:  1. Marked normocytic anemia without evidence of bleeding 2. Chronic thrombocytopenia 3. Elevated total protein of unclear significance 4. Possible pulmonary nodule 5. Dementia, schizoaffective disorder  Discharge Condition: Improved Disposition: Return to skilled nursing facility  Diet recommendation: Resume previous diet  Filed Weights   11/09/14 1425  Weight: 58.968 kg (130 lb)    History of present illness:  80 year old woman with history of dementia, schizoaffective disorder who was evaluated 07/2014 for vaginal bleeding thought secondary to cervicitis from foreign body which was removed by gynecology at that time. Subsequently was hospitalized for  bleeding thought secondary to biopsy site superimposed on unclear additional process.  She was sent to the emergency department 12/28 with history of hemoglobin 6.8. Per EDP, stool Hemoccult negative, no evidence of rectal or vaginal bleeding. Admitted for transfusion and further evaluation.  Hospital Course:  [name] was observed overnight, transfuse 2 units packed red blood cells with appropriate increase in her hemoglobin. There is no evidence of bleeding. Her clinical course was uncomplicated. Chronic anemia and thrombocytopenia suggest possibility for primary hematologic process. After discussion with daughter, plan to transfer back to her facility with outpatient follow-up. See individual issues below.  1. Normocytic anemia, suspect hematologic rather than blood loss, as suggested by no evidence of bleeding and also thrombocytopenia and elevated protein. Guaiac negative in the emergency department. No evidence of GI or vaginal bleeding. Most recently seen late September by GYN without acute issues. No history to suggest recurrent vaginal bleeding. One dark stool, on iron. 2. Chronic thrombocytopenia, stable. Suspect hematologic process as above. Outpatient evaluation planned. 3. Elevated total protein of unclear significance. Outpatient evaluation planned 4. Chest x-ray showed interstitial pulmonary edema, however patient completely asymptomatic with benign examination, treated with one dose of Lasix. Focal consolidation versus nodule opacity left lung base. Clinical exam, lack of fever, no hypoxia, no leukocytosis--this appears to be atelectasis. No evidence of pneumonia. Short-term follow-up to exclude underlying pulmonary nodule recommended.  5. Dementia, Schizoaffective disorder. Stable. 6. Seen in the office by GYN 9/3 for vaginal bleeding, at that time found to have cervicitis secondary to foreign body which was removed 9/3 by gynecology. Admitted 07/24/2014 for anemia, with noted bleeding  from cervical  biopsy site from outpatient procedure, that time cervicitis had resolved. GYN doubted that anemia could be fully explained by bleeding from biopsy site. Last follow-up in the office 08/04/2014 at which time she was noted to be doing well.   Completely asymptomatic. No evidence of ongoing bleeding. Subacute thrombocytopenia and anemia without evidence of bleeding as well as elevated total protein suggest possibility of primary hematologic process.   Discussed in detail with daughter at bedside, she would like her mother transferred back as soon as possible and prefers any further evaluation be done as an outpatient. We discussed referrals both to hematology as well as GI. In the interim follow CBC as skilled nursing facility.  Consultants:  none  Procedures:  12/28 Transfusion one unit packed red blood cells  12/29 transfusion one unit packed red blood cells  Antibiotics: none  Discharge Instructions   Current Discharge Medication List    CONTINUE these medications which have NOT CHANGED   Details  clonazePAM (KLONOPIN) 0.25 MG disintegrating tablet Take 0.25 mg by mouth at bedtime.    fentaNYL (DURAGESIC - DOSED MCG/HR) 12 MCG/HR Place 1 patch (12.5 mcg total) onto the skin every 3 (three) days. Qty: 5 patch, Refills: 0    ferrous sulfate 325 (65 FE) MG tablet Take 1 tablet (325 mg total) by mouth 3 (three) times daily with meals.    fluticasone (FLONASE) 50 MCG/ACT nasal spray Place 2 sprays into both nostrils daily.    magnesium hydroxide (MILK OF MAGNESIA) 400 MG/5ML suspension Take 30 mLs by mouth daily as needed (bowel mobility).     Melatonin 3 MG TABS Take 1 tablet by mouth at bedtime as needed (insomnia).     Multiple Vitamins-Minerals (MULTIVITAMIN WITH MINERALS) tablet Take 1 tablet by mouth daily.    polyethylene glycol (MIRALAX / GLYCOLAX) packet Take 17 g by mouth daily.    acetaminophen (TYLENOL) 500 MG tablet Take 500 mg by mouth 3 (three)  times daily as needed for mild pain.     amLODipine (NORVASC) 5 MG tablet Take 1 tablet (5 mg total) by mouth daily.    HYDROcodone-acetaminophen (NORCO/VICODIN) 5-325 MG per tablet Take 1 tablet by mouth every 4 (four) hours as needed (pain). Qty: 20 tablet, Refills: 0      STOP taking these medications     clonazePAM (KLONOPIN) 0.5 MG tablet      metroNIDAZOLE (FLAGYL) 500 MG tablet        No Known Allergies  The results of significant diagnostics from this hospitalization (including imaging, microbiology, ancillary and laboratory) are listed below for reference.    Significant Diagnostic Studies: Dg Chest Port 1 View  11/09/2014   CLINICAL DATA:  Dementia.  EXAM: PORTABLE CHEST - 1 VIEW  COMPARISON:  05/27/2012  FINDINGS: Multiple leads overlie the patient. Enlarged cardiac and mediastinal contours. Bilateral predominantly perihilar interstitial pulmonary opacities. Additionally there is more focal consolidation versus nodular opacity within the left lung base. No pleural effusion or pneumothorax.  IMPRESSION: Cardiomegaly. Perihilar interstitial pulmonary opacities suggestive of interstitial pulmonary edema.  More focal consolidation versus nodular opacity within the left lung base. This may represent atelectasis or infection. Short-term radiographic followup is recommended to ensure resolution and exclude underlying pulmonary nodule.   Electronically Signed   By: Lovey Newcomer M.D.   On: 11/09/2014 18:31    Microbiology: Recent Results (from the past 240 hour(s))  MRSA PCR Screening     Status: None   Collection Time: 11/10/14  6:15 AM  Result  Value Ref Range Status   MRSA by PCR NEGATIVE NEGATIVE Final    Comment:        The GeneXpert MRSA Assay (FDA approved for NASAL specimens only), is one component of a comprehensive MRSA colonization surveillance program. It is not intended to diagnose MRSA infection nor to guide or monitor treatment for MRSA infections.       Labs: Basic Metabolic Panel:  Recent Labs Lab 11/09/14 1523 11/10/14 0604  NA 135 134*  K 3.4* 3.6  CL 108 107  CO2 28 26  GLUCOSE 99 81  BUN 26* 23  CREATININE 1.18* 1.03  CALCIUM 7.9* 8.0*   Liver Function Tests:  Recent Labs Lab 11/10/14 0604  AST 12  ALT 9  ALKPHOS 42  BILITOT 0.5  PROT 11.2*  ALBUMIN 2.1*   CBC:  Recent Labs Lab 11/09/14 1523 11/10/14 0604 11/10/14 1431  WBC 5.8 5.6 5.9  NEUTROABS 3.4  --   --   HGB 7.2* 7.6* 8.9*  HCT 21.2* 23.1* 26.4*  MCV 99.5 97.1 94.3  PLT PLATELET CLUMPS NOTED ON SMEAR, COUNT APPEARS DECREASED 91* 98*    Principal Problem:   Normocytic anemia Active Problems:   Dementia without behavioral disturbance   Essential hypertension   Schizoaffective disorder, unspecified type   Thrombocytopenia   Time coordinating discharge: 35 minutes  Signed:  Murray Hodgkins, MD [facility] 11/10/2014, 4:14 PM

## 2014-11-10 NOTE — Clinical Social Work Psychosocial (Signed)
Clinical Social Work Department BRIEF PSYCHOSOCIAL ASSESSMENT 11/10/2014  Patient:  [name], [name]     Account Number:  [id number]     Admit date:  11/09/2014  Clinical Social Worker:  [name]  Date/Time:  11/10/2014 10:06 AM  Referred by:  CSW  Date Referred:  11/10/2014 Referred for  SNF Placement   Other Referral:   Interview type:  Patient Other interview type:   daughter- [name]    PSYCHOSOCIAL DATA Living Status:  FACILITY Admitted from facility:  [facility] Level of care:  [place] Primary support name:  [name] Primary support relationship to patient:  CHILD, ADULT Degree of support available:   supportive    CURRENT CONCERNS Current Concerns  Post-Acute Placement   Other Concerns:    SOCIAL WORK ASSESSMENT / PLAN CSW met with pt and pt's daughter, [name] at bedside. Pt well known to CSW from previous admissions. She is oriented to self only. [name] is very involved and supportive. She took pt to her home for 5 days over holidays last week. Pt has been a resident at [facility] for about 3 years and is on memory care unit. [name] lives in [place] and visits pt as often as she can. [name] has had issues with different things at [facility] which she has addressed there. She reports things are not much different, but does not want to consider other facilities at this point. She feels that a facility does not provide the care that she would. She was considering [name], but feels that pt needs nursing level of care. Per [name] at facility, pt is a limited assist with ADLs. She is okay to return at d/c.   Assessment/plan status:  Psychosocial Support/Ongoing Assessment of Needs Other assessment/ plan:   Information/referral to community resources:   [name]    PATIENT'S/FAMILY'S RESPONSE TO PLAN OF CARE: Pt will return to [name] when medically stable. CSW  will follow.       [name], Max

## 2014-11-10 NOTE — Clinical Social Work Note (Signed)
Pt d/c today back to [facility]. [name], Altha Harm and facility aware and agreeable. Chris at facility is agreeable to no FL2 due to <24 hour observation. Facility to provide transport.  Benay Pike, [place]

## 2014-11-10 NOTE — Progress Notes (Signed)
IV removed. Patient being discharge to the [facility]. Daughter at bedside. [facility] to pick patient up. Awaiting ride.

## 2014-11-10 NOTE — Progress Notes (Signed)
Unit PRBC started at this time.

## 2014-11-10 NOTE — Care Management Note (Signed)
    Page 1 of 1   11/10/2014     2:01:28 PM CARE MANAGEMENT NOTE 11/10/2014  Patient:  TAHARI, CLABAUGH   Account Number:  [id number]  Date Initiated:  11/10/2014  Documentation initiated by:  Jolene Provost  Subjective/Objective Assessment:   Pt is from [facility], [place]. Pt plans to return to the [facility] at discharge. CSW aware of discharge plan and will arrange for return to facility. No CM needs.     Action/Plan:   Anticipated DC Date:  11/12/2014   Anticipated DC Plan:  SKILLED NURSING FACILITY  In-house referral  Clinical Social Worker      DC Planning Services  CM consult      Choice offered to / List presented to:             Status of service:  Completed, signed off Medicare Important Message given?   (If response is "NO", the following Medicare IM given date fields will be blank) Date Medicare IM given:   Medicare IM given by:   Date Additional Medicare IM given:   Additional Medicare IM given by:    Discharge Disposition:  [place]  Per UR Regulation:  Reviewed for med. necessity/level of care/duration of stay  If discussed at [place] of Stay Meetings, dates discussed:    Comments:  11/10/2014 [place], RN, MSN, [facility]

## 2014-11-10 NOTE — Progress Notes (Signed)
Blood transfusion finished. Patient tolerated well.

## 2014-11-10 NOTE — Progress Notes (Addendum)
PROGRESS NOTE  JOELL BUERGER ZHG:[id number] DOB: Sep 15, 1930 DOA: 11/09/2014 PCP: [name], DO  Summary: 80 year old woman with history of dementia, schizoaffective disorder who was evaluated 07/2014 for vaginal bleeding thought secondary to cervicitis from foreign body which was removed by gynecology at that time. Subsequently was hospitalized for bleeding thought secondary to biopsy site superimposed on unclear additional process.  She was sent to the emergency department 12/28 with history of hemoglobin 6.8. Per EDP, stool Hemoccult negative, no evidence of rectal or vaginal bleeding. Admitted for transfusion and further evaluation.  Assessment/Plan: 1. Normocytic anemia, suspect hematologic rather than blood loss, as suggested by no evidence of bleeding and also thrombocytopenia and elevated protein. Guaiac negative in the emergency department. No evidence of GI or vaginal bleeding. Most recently seen late September by GYN without acute issues. No history to suggest recurrent vaginal bleeding. One dark stool, on iron. 2. Chronic thrombocytopenia, stable. Suspect hematologic process as above. Outpatient evaluation planned. 3. Elevated total protein of unclear significance. Outpatient evaluation planned 4. Chest x-ray showed interstitial pulmonary edema, however patient completely asymptomatic with benign examination, treated with one dose of Lasix. Focal consolidation versus nodule opacity left lung base, atelectasis or infection also seen. Clinical exam, lack of fever, no hypoxia, no leukocytosis--this appears to be atelectasis. No evidence of pneumonia. Short-term follow-up to exclude underlying pulmonary nodule recommended.  5. Dementia, Schizoaffective disorder. Stable. 6. Seen in the office by GYN 9/3 for vaginal bleeding, at that time found to have cervicitis secondary to foreign body which was removed 9/3 by gynecology. Admitted 07/24/2014 for anemia, with noted bleeding from cervical  biopsy site from outpatient procedure, that time cervicitis had resolved. GYN doubted that anemia could be fully explained by bleeding from biopsy site. Last follow-up in the office 08/04/2014 at which time she was noted to be doing well.   Completely asymptomatic. No evidence of ongoing bleeding. Subacute thrombocytopenia and anemia without evidence of bleeding as well as elevated total protein suggest possibility of primary hematologic process.   Plan transfuse second unit packed red blood cells (done). Follow-up CBC. If hemoglobin adequately improved, plan transfer back to skilled nursing facility today.  Discussed in detail with daughter at bedside, she would like her mother transferred back as soon as possible and prefers any further evaluation be done as an outpatient. We discussed referrals both to hematology as well as GI. In the interim follow CBC as skilled nursing facility.  Follow-up anemia panel pending..  Focal consolidation versus nodule opacity left lung base, atelectasis or infection also seen. Short-term follow-up to exclude underlying pulmonary nodule recommended.   Code Status: full code DVT prophylaxis: SCDs Family Communication:  Disposition Plan: return to [facility] memory care unit  Murray Hodgkins, MD  [facility]  Pager [phone] If 7PM-7AM, please contact night-coverage at [email or web address], password [facility] 11/10/2014, 11:41 AM  LOS: 1 day   Consultants:    Procedures:  12/28 Transfusion one unit packed red blood cells  12/29 transfusion one unit packed red blood cells  Antibiotics:    HPI/Subjective: RN reports transfusion one unit packed red blood cells overnight. Some dark stool in the toilet today. No evidence of ongoing bleeding.  Feels good, no complaints, no bleeding, no pain.   Sleeping: slept very well Eating/GI: eating well, no n/v/pain Breathing: well Pain: chronic left leg pain Desires: no needs  Daughter at bedside, no  bleeding since GYN follow-up. Previously seen by Dr. Gala Romney for colonoscopy but >5 years since last.   Objective:  Filed Vitals:   11/10/14 0500 11/10/14 0938 11/10/14 0959 11/10/14 1050  BP: 142/56 144/65 151/67 167/61  Pulse: 74 70 79 78  Temp: 97.8 F (36.6 C) 97.5 F (36.4 C) 98.4 F (36.9 C) 97.7 F (36.5 C)  TempSrc: Oral Oral    Resp: 20 20 18    Height:      Weight:      SpO2: 100% 98%      Intake/Output Summary (Last 24 hours) at 11/10/14 1141 Last data filed at 11/10/14 0948  Gross per 24 hour  Intake  922.5 ml  Output   2200 ml  Net -1277.5 ml     Filed Weights   11/09/14 1425  Weight: 58.968 kg (130 lb)    Exam:     Afebrile, vital signs stable. No hypoxia.  General: appears calm and comfortable, eating lunch  Psych: alert, speech fluent and clear  CV: RRR no m/r/g. No LE edema.  Respiratory: CTA bilaterally, no w/r/r. Normal respiratory effort.  Abdomen: soft ntnd  Skin: appears grossly unremarkable  Musculoskeletal: grossly unremarkable  Neuro: nonfocal  Data Reviewed:  Urine output 0051  Basic metabolic panel unremarkable, hepatic function panel unremarkable except for elevation of total protein  Hemoglobin 7.2 >> 7.6 status post 1 unit packed red blood cells   Platelet count stable from previous admission, 91  Pertinent data: Admission Labs  Hemoglobin 7.2  Imaging   Chest x-ray: Interstitial pulmonary edema. Focal consolidation versus nodule opacity left lung base, atelectasis or infection. Short-term follow-up to exclude underlying pulmonary nodule recommended.  Other  EKG sinus rhythm, no acute changes   Pending data:  Anemia panel   Scheduled Meds: . clonazePAM  0.25 mg Oral QHS  . fentaNYL  12.5 mcg Transdermal Q72H  . fluticasone  2 spray Each Nare Daily  . multivitamin with minerals  1 tablet Oral Daily  . polyethylene glycol  17 g Oral Daily   Continuous Infusions: . sodium chloride 50 mL/hr at 11/09/14 1744      Active Problems:   Dementia without behavioral disturbance   Essential hypertension   Schizoaffective disorder, unspecified type   Anemia

## 2014-11-11 LAB — TYPE AND SCREEN
ABO/RH(D): O POS
ANTIBODY SCREEN: NEGATIVE
Unit division: 0
Unit division: 0

## 2014-12-04 ENCOUNTER — Ambulatory Visit (HOSPITAL_COMMUNITY): Payer: Self-pay | Admitting: Hematology & Oncology

## 2014-12-08 ENCOUNTER — Encounter: Payer: Self-pay | Admitting: Nurse Practitioner

## 2014-12-08 ENCOUNTER — Ambulatory Visit (INDEPENDENT_AMBULATORY_CARE_PROVIDER_SITE_OTHER): Payer: Medicare Other | Admitting: Nurse Practitioner

## 2014-12-08 VITALS — BP 154/75 | HR 86 | Temp 98.3°F | Ht 65.0 in | Wt 126.4 lb

## 2014-12-08 DIAGNOSIS — F0390 Unspecified dementia without behavioral disturbance: Secondary | ICD-10-CM

## 2014-12-08 DIAGNOSIS — K5900 Constipation, unspecified: Secondary | ICD-10-CM

## 2014-12-08 DIAGNOSIS — D649 Anemia, unspecified: Secondary | ICD-10-CM

## 2014-12-08 NOTE — Progress Notes (Signed)
Primary Care Physician:  [name], DO Primary Gastroenterologist:  Dr. Gala Romney  Chief Complaint  Patient presents with  . Anemia    HPI:   81 year old nursing home resident presents on hospital followup. Was admitted to the [facility] on 11/09/14 for asymptomatic anemia, Hgb 6.8 at [facility] where she lives and Hgb 7.2 in ED with guaiac negative stool. Base line Hgb 2 year ago was 11.7.  She underwent blood transfusion and it was felt that she had cervicitis secondary to a foreign body which was removed by gynecology. She has been doing reasonably well since that time.She was observed overnight, transfuse 2 units packed red blood cells with appropriate increase in her hemoglobin. There was no evidence of bleeding. Her clinical course was uncomplicated. Chronic anemia and thrombocytopenia suggestive possibility for primary hematologic process per hospitalist. After discussion with daughter, she was transfered back to her facility with outpatient follow-up with GI and Heme/Onc. Pertinent discharge diagnoses normocytic anemia suppected hematologic rather than blood loss; chronic thrombocytopenia suspected hematologic process. She did have a previous admission in September when she presented with vagina bleeding which had been going on for approximately one week with hemoglobin of 7.3.   The patient has dementia and is a poor historian, but is accompanied by facility staff. States she's been doing well, says she has nighttime lower abdominal pain but unsure if this has been communicated to the staff. Admits twice daily BM which she describes as "a little hard."  Denies hematochezia and staff confirms this. Occasional dark stools on iron supplementation, which she did have in the ED with heme neg stool. Admits nausea about every other day without vomiting. Denies any obvious bleeding, overt/new weakness or passing out. Appetite is pretty good, no unexplained weight loss. Has about twice a day  dysphagia, currently on a regular diet. Denies any other upper or lower GI symptoms.  Past Medical History  Diagnosis Date  . Chronic leg pain   . Poor historian   . Psychosis   . Schizoaffective disorder   . Hypertension   . High cholesterol   . Dementia   . Thrombocytopenia 11/10/2014  . Normocytic anemia 11/10/2014  . Elevated total protein 11/10/2014    Past Surgical History  Procedure Laterality Date  . Tubal ligation    . Foot surgery      Current Outpatient Prescriptions  Medication Sig Dispense Refill  . acetaminophen (TYLENOL) 500 MG tablet Take 500 mg by mouth 3 (three) times daily as needed for mild pain.     Marland Kitchen amLODipine (NORVASC) 5 MG tablet Take 1 tablet (5 mg total) by mouth daily.    . clonazePAM (KLONOPIN) 0.25 MG disintegrating tablet Take 0.25 mg by mouth at bedtime.    . fentaNYL (DURAGESIC - DOSED MCG/HR) 12 MCG/HR Place 1 patch (12.5 mcg total) onto the skin every 3 (three) days. 5 patch 0  . ferrous sulfate 325 (65 FE) MG tablet Take 1 tablet (325 mg total) by mouth 3 (three) times daily with meals.    . fluticasone (FLONASE) 50 MCG/ACT nasal spray Place 2 sprays into both nostrils daily.    Marland Kitchen HYDROcodone-acetaminophen (NORCO/VICODIN) 5-325 MG per tablet Take 1 tablet by mouth every 4 (four) hours as needed (pain). 20 tablet 0  . Melatonin 3 MG TABS Take 1 tablet by mouth at bedtime as needed (insomnia).     . Multiple Vitamins-Minerals (MULTIVITAMIN WITH MINERALS) tablet Take 1 tablet by mouth daily.    Marland Kitchen  polyethylene glycol (MIRALAX / GLYCOLAX) packet Take 17 g by mouth daily.    . magnesium hydroxide (MILK OF MAGNESIA) 400 MG/5ML suspension Take 30 mLs by mouth daily as needed (bowel mobility).      No current facility-administered medications for this visit.    Allergies as of 12/08/2014  . (No Known Allergies)    No family history on file.  History   Social History  . Marital Status: Widowed    Spouse Name: N/A    Number of Children: N/A    . Years of Education: N/A   Occupational History  . Not on file.   Social History Main Topics  . Smoking status: Never Smoker   . Smokeless tobacco: Not on file  . Alcohol Use: No  . Drug Use: No  . Sexual Activity: Not Currently    Birth Control/ Protection: Post-menopausal   Other Topics Concern  . Not on file   Social History Narrative    Review of Systems: Gen: Denies any fever, chills, fatigue, weight loss, lack of appetite.  CV: Denies chest pain, heart palpitations, syncope.  Resp: Denies shortness of breath at rest or with exertion. Denies wheezing.  GI: See HPI. Denies odynophagia. Denies hematemesis, fecal incontinence. MS: Denies joint pain, muscle weakness, cramps, or limitation of movement.  Derm: Denies rash, itching, dry skin Psych: Denies depression, anxiety, memory loss, and confusion Heme: Denies bruising, bleeding, and enlarged lymph nodes.  Physical Exam: BP 154/75 mmHg  Pulse 86  Temp(Src) 98.3 F (36.8 C) (Oral)  Ht 5\' 5"  (1.651 m)  Wt 126 lb 6.4 oz (57.335 kg)  BMI 21.03 kg/m2 General:   Alert, answers questions appropriately. Pleasant and cooperative. Well-nourished and well-developed.  Head:  Normocephalic and atraumatic. Eyes:  Without icterus, sclera clear and conjunctiva pink.  Ears:  Normal auditory acuity. Mouth:  No deformity or lesions, oral mucosa pink. No OP edema Neck:  Supple, without mass or thyromegaly. Lungs:  Clear to auscultation bilaterally. No wheezes, rales, or rhonchi. No distress.  Heart:  S1, S2 present without gallops or rubs appreciated. 9-9+ systolic murmur noted. Abdomen:  +BS, soft, non-tender and non-distended with specific attention paid to the lower abdomen. No HSM noted. No guarding or rebound. No masses appreciated.  Rectal:  Deferred  Msk:  Symmetrical without gross deformities. Normal posture. Pulses:  Normal pulses noted. Extremities:  Without clubbing or edema. Neurologic:  Awake and alert;  grossly  normal neurologically. Skin:  Intact without significant lesions or rashes. Cervical Nodes:  No significant cervical adenopathy. Psych:  Alert and cooperative. Normal mood and affect.     12/08/2014 10:40 AM

## 2014-12-08 NOTE — Assessment & Plan Note (Signed)
S/P hospitalization for normocytic anemia with Hgb 6.8, improved with PRBC transfusion and chronic thrombocytopenia. Felt to be likely hematological etiology per hospitalist, no signs or symptoms of GI bleed. Is on iron supplementation. However, cannot rule out other more occult process. Will plan for labs today (CBC. BMP, Iron studies) for further evaluation. Consider pill esophagram vs EGD for possible dysphagia, will decide after labs back to evaluate stability of H/H. Also interested to see heme/onc opinion. Can proceed with further workup at follow-up visit with lab results.

## 2014-12-08 NOTE — Progress Notes (Signed)
cc'ed to pcp °

## 2014-12-08 NOTE — Patient Instructions (Signed)
1. We will check labs on you today to see if your anemia is stable, better, or worse. Will also check iron studies. 2. When labs results are back will decide what to do regarding the occasional difficulty swallowing. 3. Add benefiber (or equivilent) once daily for increased fiber intake.

## 2014-12-08 NOTE — Assessment & Plan Note (Signed)
Likely mild constipation, has a BM twice a day but described as "a little hard." Is on narcotic po pain med and Fentanyl duragesic patch. Takes Miralax daily. Add benefiber (or equivalent) daily and adequate water intake. Can re-evaluate at follow-up visit in 3 weeks. Possible to add other medication such as Amitiza if symptoms continue and are affecting quality of life or health.

## 2014-12-09 ENCOUNTER — Telehealth: Payer: Self-pay

## 2014-12-09 NOTE — Telephone Encounter (Signed)
T/C from [place] at the [facility] calling to see if pt had her labs done here yesterday. I told her we do not draw labs here. She does have the orders and I told her they need to be drawn there. She said they just did a CBC on pt on 12/03/2014 and it was bad, should they not do the CBC. I told her if the CBC was bad to please draw again.  She said she can fax the CBC to [place] that was done on 12/03/2014.

## 2014-12-10 NOTE — Telephone Encounter (Signed)
Thanks, will look for those results when she faxes them as well as the ones we ordered.

## 2014-12-22 ENCOUNTER — Encounter (HOSPITAL_COMMUNITY): Payer: Medicare Other | Admitting: Hematology & Oncology

## 2014-12-31 ENCOUNTER — Ambulatory Visit: Payer: Medicare Other | Admitting: Nurse Practitioner

## 2015-01-01 ENCOUNTER — Encounter (HOSPITAL_COMMUNITY)
Admission: RE | Admit: 2015-01-01 | Discharge: 2015-01-01 | Disposition: A | Discharge status: Home / Self Care | Payer: Medicare Other | Source: Ambulatory Visit | Attending: Family Medicine | Admitting: Family Medicine

## 2015-01-01 DIAGNOSIS — D649 Anemia, unspecified: Secondary | ICD-10-CM | POA: Insufficient documentation

## 2015-01-01 LAB — HEMOGLOBIN AND HEMATOCRIT, BLOOD
HCT: 19.8 % — ABNORMAL LOW (ref 36.0–46.0)
HEMOGLOBIN: 6.7 g/dL — AB (ref 12.0–15.0)

## 2015-01-01 LAB — PREPARE RBC (CROSSMATCH)

## 2015-01-01 MED ORDER — SODIUM CHLORIDE 0.9 % IV SOLN
Freq: Once | INTRAVENOUS | Status: AC
  Administered 2015-01-01: 500 mL via INTRAVENOUS

## 2015-01-01 NOTE — Progress Notes (Signed)
Here for type and crossmatch and blood transfusion.

## 2015-01-01 NOTE — Progress Notes (Signed)
Results for CIMONE, FAHEY (MRN [id number]) as of 01/01/2015 09:16  Ref. Range 01/01/2015 08:01 01/01/2015 08:01  Hemoglobin Latest Range: 12.0-15.0 g/dL 6.7 (LL)   HCT Latest Range: 36.0-46.0 % 19.8 (L)   Sample Expiration No range found 01/04/2015   Antibody Screen No range found NEG   ABO/RH(D) No range found O POS   Unit Number No range found [id number]  Blood Component Type No range found RED CELLS,LR RED CELLS,LR  Unit division No range found 00 00  Status of Unit No range found ALLOCATED ALLOCATED  Transfusion Status No range found OK TO TRANSFUSE OK TO TRANSFUSE  Crossmatch Result No range found Compatible Compatible  Order Confirmation No range found ORDER PROCESSED B.Marland KitchenMarland Kitchen

## 2015-01-03 LAB — TYPE AND SCREEN
ABO/RH(D): O POS
ANTIBODY SCREEN: NEGATIVE
UNIT DIVISION: 0
Unit division: 0

## 2015-01-04 ENCOUNTER — Encounter (HOSPITAL_COMMUNITY): Payer: Self-pay | Admitting: Hematology & Oncology

## 2015-01-04 ENCOUNTER — Encounter (HOSPITAL_COMMUNITY): Payer: Medicare Other | Attending: Family Medicine | Admitting: Hematology & Oncology

## 2015-01-04 VITALS — BP 155/69 | HR 88 | Temp 98.6°F | Resp 18 | Ht 65.0 in | Wt 125.2 lb

## 2015-01-04 DIAGNOSIS — F0390 Unspecified dementia without behavioral disturbance: Secondary | ICD-10-CM

## 2015-01-04 DIAGNOSIS — D649 Anemia, unspecified: Secondary | ICD-10-CM | POA: Insufficient documentation

## 2015-01-04 DIAGNOSIS — D696 Thrombocytopenia, unspecified: Secondary | ICD-10-CM

## 2015-01-04 DIAGNOSIS — R799 Abnormal finding of blood chemistry, unspecified: Secondary | ICD-10-CM

## 2015-01-04 DIAGNOSIS — F259 Schizoaffective disorder, unspecified: Secondary | ICD-10-CM | POA: Diagnosis not present

## 2015-01-04 LAB — COMPREHENSIVE METABOLIC PANEL
ALT: 10 U/L (ref 0–35)
ANION GAP: 0 — AB (ref 5–15)
AST: 14 U/L (ref 0–37)
Albumin: 2.5 g/dL — ABNORMAL LOW (ref 3.5–5.2)
Alkaline Phosphatase: 49 U/L (ref 39–117)
BUN: 29 mg/dL — ABNORMAL HIGH (ref 6–23)
CALCIUM: 7.8 mg/dL — AB (ref 8.4–10.5)
CO2: 27 mmol/L (ref 19–32)
Chloride: 102 mmol/L (ref 96–112)
Creatinine, Ser: 1.42 mg/dL — ABNORMAL HIGH (ref 0.50–1.10)
GFR calc Af Amer: 39 mL/min — ABNORMAL LOW (ref >90)
GFR, EST NON AFRICAN AMERICAN: 33 mL/min — AB (ref >90)
GLUCOSE: 89 mg/dL (ref 70–99)
Potassium: 3.9 mmol/L (ref 3.5–5.1)
Sodium: 127 mmol/L — ABNORMAL LOW (ref 135–145)
Total Bilirubin: 0.5 mg/dL (ref 0.3–1.2)
Total Protein: >12 g/dL — ABNORMAL HIGH (ref 6.0–8.3)

## 2015-01-04 LAB — CBC WITH DIFFERENTIAL/PLATELET
BASOS ABS: 0.1 10*3/uL (ref 0.0–0.1)
Basophils Relative: 1 % (ref 0–1)
Eosinophils Absolute: 0.1 10*3/uL (ref 0.0–0.7)
Eosinophils Relative: 2 % (ref 0–5)
HCT: 24 % — ABNORMAL LOW (ref 36.0–46.0)
Hemoglobin: 8.2 g/dL — ABNORMAL LOW (ref 12.0–15.0)
LYMPHS ABS: 1.7 10*3/uL (ref 0.7–4.0)
Lymphocytes Relative: 23 % (ref 12–46)
MCH: 32.3 pg (ref 26.0–34.0)
MCHC: 34.2 g/dL (ref 30.0–36.0)
MCV: 94.5 fL (ref 78.0–100.0)
MONO ABS: 0.6 10*3/uL (ref 0.1–1.0)
Monocytes Relative: 8 % (ref 3–12)
NEUTROS ABS: 4.9 10*3/uL (ref 1.7–7.7)
NEUTROS PCT: 66 % (ref 43–77)
PLATELETS: 113 10*3/uL — AB (ref 150–400)
RBC: 2.54 MIL/uL — AB (ref 3.87–5.11)
RDW: 15.7 % — AB (ref 11.5–15.5)
WBC: 7.4 10*3/uL (ref 4.0–10.5)

## 2015-01-04 LAB — TSH: TSH: 0.983 u[IU]/mL (ref 0.350–4.500)

## 2015-01-04 LAB — RETICULOCYTES
RBC.: 2.54 MIL/uL — AB (ref 3.87–5.11)
RETIC COUNT ABSOLUTE: 38.1 10*3/uL (ref 19.0–186.0)
RETIC CT PCT: 1.5 % (ref 0.4–3.1)

## 2015-01-04 LAB — LACTATE DEHYDROGENASE: LDH: 82 U/L — AB (ref 94–250)

## 2015-01-04 NOTE — Progress Notes (Signed)
[name] presented for [employer]. Labs per MD order drawn via Peripheral Line 23 gauge needle inserted in left Ac  Good blood return present. Procedure without incident.  Needle removed intact. Patient tolerated procedure well.

## 2015-01-04 NOTE — Patient Instructions (Signed)
[place] at [facility] Discharge Instructions  RECOMMENDATIONS MADE BY THE CONSULTANT AND ANY TEST RESULTS WILL BE SENT TO YOUR REFERRING PHYSICIAN.  Discussion by Dr. Whitney Muse.   Need to get some additional labs today to help determine the cause of your anemia.  Return in 2 weeks to discuss test results.  Thank you for choosing [place] at [facility] to provide your oncology and hematology care.  To afford each patient quality time with our provider, please arrive at least 15 minutes before your scheduled appointment time.    You need to re-schedule your appointment should you arrive 10 or more minutes late.  We strive to give you quality time with our providers, and arriving late affects you and other patients whose appointments are after yours.  Also, if you no show three or more times for appointments you may be dismissed from the clinic at the providers discretion.     Again, thank you for choosing [facility].  Our hope is that these requests will decrease the amount of time that you wait before being seen by our physicians.       _____________________________________________________________  Should you have questions after your visit to [facility], please contact our office at [phone] between the hours of 8:30 a.m. and 4:30 p.m.  Voicemails left after 4:30 p.m. will not be returned until the following business day.  For prescription refill requests, have your pharmacy contact our office.

## 2015-01-05 LAB — PATHOLOGIST SMEAR REVIEW

## 2015-01-05 LAB — ERYTHROPOIETIN: ERYTHROPOIETIN: 5.7 m[IU]/mL (ref 2.6–18.5)

## 2015-01-05 LAB — VITAMIN B12: VITAMIN B 12: 1688 pg/mL — AB (ref 211–911)

## 2015-01-05 LAB — FOLATE: FOLATE: 9.9 ng/mL

## 2015-01-05 LAB — IRON AND TIBC
IRON: 65 ug/dL (ref 42–145)
SATURATION RATIOS: 52 % (ref 20–55)
TIBC: 124 ug/dL — ABNORMAL LOW (ref 250–470)
UIBC: 59 ug/dL — ABNORMAL LOW (ref 125–400)

## 2015-01-05 LAB — FERRITIN: Ferritin: 616 ng/mL — ABNORMAL HIGH (ref 10–291)

## 2015-01-05 LAB — HAPTOGLOBIN: HAPTOGLOBIN: 90 mg/dL (ref 34–200)

## 2015-01-19 ENCOUNTER — Ambulatory Visit (HOSPITAL_COMMUNITY): Payer: Self-pay | Admitting: Hematology & Oncology

## 2015-01-19 ENCOUNTER — Encounter: Payer: Self-pay | Admitting: Nurse Practitioner

## 2015-01-19 ENCOUNTER — Ambulatory Visit (INDEPENDENT_AMBULATORY_CARE_PROVIDER_SITE_OTHER): Payer: Medicare Other | Admitting: Nurse Practitioner

## 2015-01-19 VITALS — BP 160/70 | HR 84 | Temp 98.9°F | Ht 61.0 in | Wt 128.6 lb

## 2015-01-19 DIAGNOSIS — D649 Anemia, unspecified: Secondary | ICD-10-CM | POA: Diagnosis not present

## 2015-01-19 NOTE — Progress Notes (Signed)
Referring Provider: [name], DO Primary Care Physician:  [name], DO Primary GI:  Dr. Gala Romney  Chief Complaint  Patient presents with  . Anemia    HPI:   81 year old female with a history of dimentia presents for follow-up on normocytic anemia. Previously seen here about a month ago as hospital follow-up, has had a couple hospitizations in the past 6 months for anemia as low as 7.2 without signs or symptoms of overt GI bleed. Was also referred to Heme/Onc for outpatient evaluation. Labs ordered last visit appear to not have been drawn by the facility. However, H/H drawn by heme/onc on 2/19 showed recurrent profound anemia of 6.7/19.8 and subsequent PRBC transfusion. Additional bloodwork drawn by heme/onc. Hospitalist impression likely hematology issue and not due to blood loss anemia. Previous visit was consistent with this due to staff and patient reports of no overt GI symptoms other then minor constipation.  Today patient is accompanied by her daughter. Patient is a poor historian due to dementia. Both she and her daughter who occasionally cares for her denies abdominal pain, N/V. Admits occasional loose stools. Denies hematochezia or melena.Denies any other upper or lower GI symptoms.   Past Medical History  Diagnosis Date  . Chronic leg pain   . Poor historian   . Psychosis   . Schizoaffective disorder   . Hypertension   . High cholesterol   . Dementia   . Thrombocytopenia 11/10/2014  . Normocytic anemia 11/10/2014  . Elevated total protein 11/10/2014    Past Surgical History  Procedure Laterality Date  . Tubal ligation    . Foot surgery      Current Outpatient Prescriptions  Medication Sig Dispense Refill  . acetaminophen (TYLENOL) 500 MG tablet Take 500 mg by mouth 3 (three) times daily as needed for mild pain.     Marland Kitchen amLODipine (NORVASC) 5 MG tablet Take 1 tablet (5 mg total) by mouth daily.    . clonazePAM (KLONOPIN) 0.25 MG disintegrating tablet Take 0.25 mg  by mouth at bedtime.    . fentaNYL (DURAGESIC - DOSED MCG/HR) 12 MCG/HR Place 1 patch (12.5 mcg total) onto the skin every 3 (three) days. 5 patch 0  . ferrous sulfate 325 (65 FE) MG tablet Take 1 tablet (325 mg total) by mouth 3 (three) times daily with meals.    . fluticasone (FLONASE) 50 MCG/ACT nasal spray Place 2 sprays into both nostrils daily.    Marland Kitchen HYDROcodone-acetaminophen (NORCO/VICODIN) 5-325 MG per tablet Take 1 tablet by mouth every 4 (four) hours as needed (pain). 20 tablet 0  . magnesium hydroxide (MILK OF MAGNESIA) 400 MG/5ML suspension Take 30 mLs by mouth daily as needed (bowel mobility).     . Melatonin 3 MG TABS Take 1 tablet by mouth at bedtime as needed (insomnia).     . Multiple Vitamins-Minerals (MULTIVITAMIN WITH MINERALS) tablet Take 1 tablet by mouth daily.    . polyethylene glycol (MIRALAX / GLYCOLAX) packet Take 17 g by mouth daily.     No current facility-administered medications for this visit.    Allergies as of 01/19/2015  . (No Known Allergies)    Family History  Problem Relation Age of Onset  . Diabetes Mother     History   Social History  . Marital Status: Widowed    Spouse Name: N/A  . Number of Children: N/A  . Years of Education: N/A   Social History Main Topics  . Smoking status: Never Smoker   .  Smokeless tobacco: Not on file  . Alcohol Use: No  . Drug Use: No  . Sexual Activity: Not Currently    Birth Control/ Protection: Post-menopausal   Other Topics Concern  . None   Social History Narrative    Review of Systems: Gen: Poor historian, pleasantly confused. Daughter assists with history. Denies fever, chills. Admits fatigue, weakness.  CV: Denies chest pain, syncope, peripheral edema. Resp: Denies dyspnea at rest, cough, wheezing, coughing up blood, and pleurisy. GI: See HPI. Denies vomiting blood. Denies dysphagia or odynophagia. Psych: Denies depression, anxiety, memory loss, confusion. No homicidal or suicidal ideation.    Heme: Denies bruising, bleeding, and enlarged lymph nodes.  Physical Exam: BP 160/70 mmHg  Pulse 84  Temp(Src) 98.9 F (37.2 C)  Ht 5\' 1"  (1.549 m)  Wt 128 lb 9.6 oz (58.333 kg)  BMI 24.31 kg/m2 General:   Alert but disoriented consistent with dementia history. No distress noted. Pleasant and cooperative.  Head:  Normocephalic and atraumatic. Lungs:  Clear to auscultation bilaterally. No wheezes, rales, or rhonchi. No distress.  Heart:  S1, S2 present withou rubs, or gallops. Systolic murmur noted. Regular rate and rhythm. Abdomen:  +BS, soft, non-tender and non-distended. No rebound or guarding. No HSM or masses noted. Msk:  Symmetrical without gross deformities. Normal posture. Extremities:  Without edema. Neurologic:  Grossly normal neurologically and consistent with baseline. Skin:  Intact without significant lesions or rashes. Psych:  Alert and cooperative. Normal mood and affect.    01/19/2015 2:17 PM

## 2015-01-19 NOTE — Assessment & Plan Note (Signed)
Benign GI ROS and exam. Consistent with hospitalist assertion that anemia is likely heme/onc in origin and not due to blood loss, especially given the degree of anemia. Patient has a follow-up with heme/onc tomorrow. Daughter questions her mother's ability to tolerate the bowel prep and colonoscopy procedure should that be necessary. She prefers to allow heme/onc to complete their workup. Will have patient return for follow-up in 3 months to evaluate the situation given heme/onc opinion and decide if there's rationale for further GI workup after considering likely etiology, risks, and benefits. Daughter is in agreement with this plan. Offered for her to call if there's any questions or changing symptoms.

## 2015-01-19 NOTE — Patient Instructions (Signed)
1. Keep your follow-up with the Hematology/Oncology center 2. We will have you follow-up in 3 months to reassess after heme/onc has completed their work to see if there's any need for [place] to move forward with any GI testing 3. If you have any questions please call. 4. If the facility needs any documentation have them call our office and we can send it to them

## 2015-01-20 ENCOUNTER — Encounter (HOSPITAL_COMMUNITY): Payer: Self-pay | Admitting: Hematology & Oncology

## 2015-01-20 ENCOUNTER — Encounter (HOSPITAL_COMMUNITY): Payer: Medicare Other | Attending: Family Medicine | Admitting: Hematology & Oncology

## 2015-01-20 ENCOUNTER — Encounter (HOSPITAL_COMMUNITY): Payer: Medicare Other

## 2015-01-20 VITALS — BP 168/65 | HR 85 | Temp 98.6°F | Resp 20 | Wt 128.5 lb

## 2015-01-20 DIAGNOSIS — F259 Schizoaffective disorder, unspecified: Secondary | ICD-10-CM | POA: Diagnosis not present

## 2015-01-20 DIAGNOSIS — F0390 Unspecified dementia without behavioral disturbance: Secondary | ICD-10-CM | POA: Insufficient documentation

## 2015-01-20 DIAGNOSIS — D696 Thrombocytopenia, unspecified: Secondary | ICD-10-CM | POA: Insufficient documentation

## 2015-01-20 DIAGNOSIS — I10 Essential (primary) hypertension: Secondary | ICD-10-CM | POA: Diagnosis not present

## 2015-01-20 DIAGNOSIS — R799 Abnormal finding of blood chemistry, unspecified: Secondary | ICD-10-CM

## 2015-01-20 DIAGNOSIS — C9000 Multiple myeloma not having achieved remission: Secondary | ICD-10-CM | POA: Diagnosis not present

## 2015-01-20 DIAGNOSIS — D649 Anemia, unspecified: Secondary | ICD-10-CM | POA: Insufficient documentation

## 2015-01-20 DIAGNOSIS — D538 Other specified nutritional anemias: Secondary | ICD-10-CM

## 2015-01-20 NOTE — Progress Notes (Signed)
CC'ED TO PCP 

## 2015-01-20 NOTE — Patient Instructions (Signed)
[place] at [facility]  Discharge Instructions:  You saw Dr Whitney Muse today.  We are going to do lab work today and see you back in 2 weeks.  We will notify you if we need to do anything different depending on the lab work.  If you have any questions or concerns please call the clinic. _______________________________________________________________  Thank you for choosing [place] at [facility] to provide your oncology and hematology care.  To afford each patient quality time with our providers, please arrive at least 15 minutes before your scheduled appointment.  You need to re-schedule your appointment if you arrive 10 or more minutes late.  We strive to give you quality time with our providers, and arriving late affects you and other patients whose appointments are after yours.  Also, if you no show three or more times for appointments you may be dismissed from the clinic.  Again, thank you for choosing [place] [facility] [place] hope is that these requests will allow you access to exceptional care and in a timely manner. _______________________________________________________________  If you have questions after your visit, please contact our office at [phone] between the hours of 8:30 a.m. and 5:00 p.m. Voicemails left after 4:30 p.m. will not be returned until the following business day. _______________________________________________________________  For prescription refill requests, have your pharmacy contact our office. _______________________________________________________________  Recommendations made by the consultant and any test results will be sent to your referring physician. _______________________________________________________________

## 2015-01-20 NOTE — Progress Notes (Deleted)
[name], [address] [place] [address]    DIAGNOSIS: No matching staging information was found for the patient.  SUMMARY OF ONCOLOGIC HISTORY:  No history exists.    CURRENT THERAPY:  INTERVAL HISTORY: [name] 81 y.o. female returns for   MEDICAL HISTORY: Past Medical History  Diagnosis Date  . Chronic leg pain   . Poor historian   . Psychosis   . Schizoaffective disorder   . Hypertension   . High cholesterol   . Dementia   . Thrombocytopenia 11/10/2014  . Normocytic anemia 11/10/2014  . Elevated total protein 11/10/2014    has HEMORRHOIDS, INTERNAL; GERD; OTHER DYSPHAGIA; CERVICAL MUSCLE STRAIN; COLONIC POLYPS, ADENOMATOUS, HX OF; Dementia without behavioral disturbance; Altered mental status; Essential hypertension; Routine gynecological examination; Cervicitis; Vaginal bleeding; Schizoaffective disorder, unspecified type; Vagina bleeding; Anemia; Normocytic anemia; Thrombocytopenia; Elevated total protein; and Constipation on her problem list.     has No Known Allergies.  [name] does not currently have medications on file.  SURGICAL HISTORY: Past Surgical History  Procedure Laterality Date  . Tubal ligation    . Foot surgery      SOCIAL HISTORY: History   Social History  . Marital Status: Widowed    Spouse Name: N/A  . Number of Children: N/A  . Years of Education: N/A   Occupational History  . Not on file.   Social History Main Topics  . Smoking status: Never Smoker   . Smokeless tobacco: Not on file  . Alcohol Use: No  . Drug Use: No  . Sexual Activity: Not Currently    Birth Control/ Protection: Post-menopausal   Other Topics Concern  . Not on file   Social History Narrative    FAMILY HISTORY: Family History  Problem Relation Age of Onset  . Diabetes Mother     ROS  PHYSICAL EXAMINATION  ECOG PERFORMANCE STATUS: {CHL ONC ECOG QQ:[id number]}  Filed Vitals:   01/20/15 1100  BP:  168/65  Pulse: 85  Temp: 98.6 F (37 C)  Resp: 20    Physical Exam  LABORATORY DATA:  CBC    Component Value Date/Time   WBC 7.4 01/04/2015 1700   RBC 2.54* 01/04/2015 1700   RBC 2.54* 01/04/2015 1700   HGB 8.2* 01/04/2015 1700   HCT 24.0* 01/04/2015 1700   PLT 113* 01/04/2015 1700   MCV 94.5 01/04/2015 1700   MCH 32.3 01/04/2015 1700   MCHC 34.2 01/04/2015 1700   RDW 15.7* 01/04/2015 1700   LYMPHSABS 1.7 01/04/2015 1700   MONOABS 0.6 01/04/2015 1700   EOSABS 0.1 01/04/2015 1700   BASOSABS 0.1 01/04/2015 1700   CMP     Component Value Date/Time   NA 127* 01/04/2015 1700   K 3.9 01/04/2015 1700   CL 102 01/04/2015 1700   CO2 27 01/04/2015 1700   GLUCOSE 89 01/04/2015 1700   BUN 29* 01/04/2015 1700   CREATININE 1.42* 01/04/2015 1700   CALCIUM 7.8* 01/04/2015 1700   PROT >12.0* 01/04/2015 1700   ALBUMIN 2.5* 01/04/2015 1700   AST 14 01/04/2015 1700   ALT 10 01/04/2015 1700   ALKPHOS 49 01/04/2015 1700   BILITOT 0.5 01/04/2015 1700   GFRNONAA 33* 01/04/2015 1700   GFRAA 39* 01/04/2015 1700       ASSESSMENT and THERAPY PLAN:    No problem-specific assessment & plan notes found for this encounter.   All questions were answered. The patient knows to call the clinic with  any problems, questions or concerns. We can certainly see the patient much sooner if necessary. This note was electronically signed. Molli Hazard 01/20/2015

## 2015-01-21 LAB — KAPPA/LAMBDA LIGHT CHAINS
KAPPA FREE LGHT CHN: 257.41 mg/L — AB (ref 3.30–19.40)
KAPPA, LAMDA LIGHT CHAIN RATIO: 95.69 — AB (ref 0.26–1.65)
LAMDA FREE LIGHT CHAINS: 2.69 mg/L — AB (ref 5.71–26.30)

## 2015-01-21 LAB — BETA 2 MICROGLOBULIN, SERUM: BETA 2 MICROGLOBULIN: 8.1 mg/L — AB (ref 0.6–2.4)

## 2015-01-21 LAB — IGG, IGA, IGM
IGA: 18 mg/dL — AB (ref 69–380)
IgG (Immunoglobin G), Serum: 9990 mg/dL — ABNORMAL HIGH (ref 690–1700)
IgM, Serum: <5 mg/dL — ABNORMAL LOW (ref 52–322)

## 2015-01-22 ENCOUNTER — Telehealth (HOSPITAL_COMMUNITY): Payer: Self-pay | Admitting: Hematology & Oncology

## 2015-01-22 LAB — IMMUNOFIXATION ELECTROPHORESIS
IGG (IMMUNOGLOBIN G), SERUM: 10600 mg/dL — AB (ref 690–1700)
IgA: 18 mg/dL — ABNORMAL LOW (ref 69–380)
IgM, Serum: <5 mg/dL — ABNORMAL LOW (ref 52–322)
Total Protein ELP: 13.4 g/dL — ABNORMAL HIGH (ref 6.0–8.3)

## 2015-01-22 LAB — PROTEIN ELECTROPHORESIS, SERUM
ALBUMIN ELP: 23.3 % — AB (ref 55.8–66.1)
ALPHA-2-GLOBULIN: 3.9 % — AB (ref 7.1–11.8)
Alpha-1-Globulin: 1.8 % — ABNORMAL LOW (ref 2.9–4.9)
Beta 2: 1 % — ABNORMAL LOW (ref 3.2–6.5)
Beta Globulin: 1.5 % — ABNORMAL LOW (ref 4.7–7.2)
GAMMA GLOBULIN: 68.5 % — AB (ref 11.1–18.8)
M-Spike, %: 8.68 g/dL
Total Protein ELP: 13.4 g/dL — ABNORMAL HIGH (ref 6.0–8.3)

## 2015-01-22 NOTE — Telephone Encounter (Signed)
Spoke with patient's daughter about the myeloma panel results. I am very concerned that she has myeloma. It is difficult for the patient to get to the clinic as she resides in a home. Her daughter has to pick her up and get her here, in addition her daughter works.  We will call her daughter back Monday to see if she has several dates in which we can set up a bone marrow biopsy. She does not feel able to take her mother to [facility] for a bone marrow and request that we do it here at [facility] in the clinic.  Molli Hazard, MD

## 2015-01-26 NOTE — Progress Notes (Signed)
[place] CONSULT NOTE  Patient Care Team: [name], DO as PCP - General (Family Medicine) Daneil Dolin, MD as Consulting Physician (Gastroenterology)  CHIEF COMPLAINTS/PURPOSE OF CONSULTATION:  Anemia  HISTORY OF PRESENTING ILLNESS:  [name] 81 y.o. female is here because of anemia. CBCs dating back several months show a hemoglobin as low as 7.2., Mild thrombocytopenia is also noted with a platelet count in the 90,000 range. Additional chart review shows a hemoglobin of 11.7 in July 2013. S92 and folic acid were normal in December. Iron studies were consistent with anemia of chronic disease. Fecal occult blood testing was negative. She has been transfused on several occasions.  She was seen in gynecology in September of last year for vaginal bleeding and was noted to have cervicitis secondary to perform body. She was admitted in September of last year for anemia. She was again admitted in December for hemoglobin of 6.8. With no obvious source of blood loss. She was noted to have chronic thrombocytopenia and also elevated total protein at that time.  Her daughter states her mother has significant dementia, she also has a diagnosis of schizoaffective disorder. She resides at the [facility]. Her daughter takes her home several weekends a month. She states her mother does not like to eat the food at the [facility] but eats very well when she is home with her. Her daughter also states she is willing to proceed with a workup of her mother's anemia.  The patient herself is a poor historian and review of systems comes from her daughter.  MEDICAL HISTORY:  Past Medical History  Diagnosis Date  . Chronic leg pain   . Poor historian   . Psychosis   . Schizoaffective disorder   . Hypertension   . High cholesterol   . Dementia   . Thrombocytopenia 11/10/2014  . Normocytic anemia 11/10/2014  . Elevated total protein 11/10/2014    SURGICAL HISTORY: Past  Surgical History  Procedure Laterality Date  . Tubal ligation    . Foot surgery      SOCIAL HISTORY: History   Social History  . Marital Status: Widowed    Spouse Name: N/A  . Number of Children: N/A  . Years of Education: N/A   Occupational History  . Not on file.   Social History Main Topics  . Smoking status: Never Smoker   . Smokeless tobacco: Not on file  . Alcohol Use: No  . Drug Use: No  . Sexual Activity: Not Currently    Birth Control/ Protection: Post-menopausal   Other Topics Concern  . Not on file   Social History Narrative   resides at the [facility] in [place]. She has 8 children.  5 boys and 3 girls. She is widowed. Born in [place] HISTORY: Family History  Problem Relation Age of Onset  . Diabetes Mother    indicated that her mother is deceased. She indicated that her father is deceased.   Her mother died at a very young age in 3300 from an uncertain cause. Her father died in his 76A from complications of Alzheimer's. She has 2 sisters that are living, one sister that is deceased, and all of her brothers are deceased. Her daughter denies any significant history of cancer in the family.  ALLERGIES:  has No Known Allergies.  MEDICATIONS:  Current Outpatient Prescriptions  Medication Sig Dispense Refill  . acetaminophen (TYLENOL) 500 MG tablet Take 500 mg by  mouth 3 (three) times daily as needed for mild pain.     Marland Kitchen amLODipine (NORVASC) 5 MG tablet Take 1 tablet (5 mg total) by mouth daily.    . clonazePAM (KLONOPIN) 0.25 MG disintegrating tablet Take 0.25 mg by mouth at bedtime.    . fentaNYL (DURAGESIC - DOSED MCG/HR) 12 MCG/HR Place 1 patch (12.5 mcg total) onto the skin every 3 (three) days. 5 patch 0  . ferrous sulfate 325 (65 FE) MG tablet Take 1 tablet (325 mg total) by mouth 3 (three) times daily with meals.    . fluticasone (FLONASE) 50 MCG/ACT nasal spray Place 2 sprays into both nostrils daily.    Marland Kitchen  HYDROcodone-acetaminophen (NORCO/VICODIN) 5-325 MG per tablet Take 1 tablet by mouth every 4 (four) hours as needed (pain). 20 tablet 0  . Melatonin 3 MG TABS Take 1 tablet by mouth at bedtime as needed (insomnia).     . Multiple Vitamins-Minerals (MULTIVITAMIN WITH MINERALS) tablet Take 1 tablet by mouth daily.    . polyethylene glycol (MIRALAX / GLYCOLAX) packet Take 17 g by mouth daily.    . magnesium hydroxide (MILK OF MAGNESIA) 400 MG/5ML suspension Take 30 mLs by mouth daily as needed (bowel mobility).      No current facility-administered medications for this visit.    Review of Systems  Constitutional:       20 pound weight loss over the past year  HENT: Positive for hearing loss.   Eyes: Negative.   Respiratory: Positive for shortness of breath.        Uses an inhaler as needed  Cardiovascular: Negative.   Gastrointestinal: Positive for abdominal pain and constipation.  Genitourinary: Negative.   Musculoskeletal: Positive for joint pain.  Skin: Negative.   Neurological: Positive for weakness. Negative for dizziness, tingling, tremors, seizures and loss of consciousness.  Endo/Heme/Allergies: Negative.   Psychiatric/Behavioral: Positive for memory loss. The patient has insomnia.        Daughter states her mother used to dress very "fancy".     PHYSICAL EXAMINATION: ECOG PERFORMANCE STATUS: 1 - Symptomatic but completely ambulatory  Filed Vitals:   01/04/15 1542  BP: 155/69  Pulse: 88  Temp: 98.6 F (37 C)  Resp: 18   Filed Weights   01/04/15 1542  Weight: 125 lb 3.2 oz (56.79 kg)     Physical Exam  Constitutional: She is oriented to person, place, and time and well-developed, well-nourished, and in no distress.  HENT:  Head: Normocephalic and atraumatic.  Nose: Nose normal.  Mouth/Throat: Oropharynx is clear and moist. No oropharyngeal exudate.  Eyes: Conjunctivae and EOM are normal. Pupils are equal, round, and reactive to light. Right eye exhibits no  discharge. Left eye exhibits no discharge. No scleral icterus.  Neck: Normal range of motion. Neck supple. No tracheal deviation present. No thyromegaly present.  Cardiovascular: Normal rate, regular rhythm and normal heart sounds.  Exam reveals no gallop and no friction rub.   No murmur heard. Pulmonary/Chest: Effort normal and breath sounds normal. She has no wheezes. She has no rales.  Abdominal: Soft. Bowel sounds are normal. She exhibits no distension and no mass. There is no tenderness. There is no rebound and no guarding.  Musculoskeletal: Normal range of motion. She exhibits no edema.  Lymphadenopathy:    She has no cervical adenopathy.  Neurological: She is alert and oriented to person, place, and time. She has normal reflexes. No cranial nerve deficit. Gait normal. Coordination normal.  Skin: Skin is warm and  dry. No rash noted.  Psychiatric: Mood and affect normal.  Nursing note and vitals reviewed.    LABORATORY DATA:  I have reviewed the data as listed Lab Results  Component Value Date   WBC 7.4 01/04/2015   HGB 8.2* 01/04/2015   HCT 24.0* 01/04/2015   MCV 94.5 01/04/2015   PLT 113* 01/04/2015     Chemistry      Component Value Date/Time   NA 127* 01/04/2015 1700   K 3.9 01/04/2015 1700   CL 102 01/04/2015 1700   CO2 27 01/04/2015 1700   BUN 29* 01/04/2015 1700   CREATININE 1.42* 01/04/2015 1700      Component Value Date/Time   CALCIUM 7.8* 01/04/2015 1700   ALKPHOS 49 01/04/2015 1700   AST 14 01/04/2015 1700   ALT 10 01/04/2015 1700   BILITOT 0.5 01/04/2015 1700      ASSESSMENT & PLAN:   Anemia  81 year old female who presents with a transfusion dependent anemia. I reviewed the possible diagnoses with her daughter including primary bone marrow disorders. For today we discussed proceeding with a peripheral evaluation. The daughter states she can bring her mother to her appointments but we will have to work around her work schedule. I assured her this  should not be a problem. The patient is also noted to have an elevated serum protein and certainly myeloma should be in the differential. We will continue today with peripheral labs only I will bring her back in 1-2 weeks to review the results. I advised her daughter that if she has a significant anemia on lab today we will arrange for transfusion until we can determine the exact cause of her anemia. I advised her to bring questions to her next follow-up. If she is interim problems or concerns. Advised to call.  Orders Placed This Encounter  Procedures  . Reticulocytes    Standing Status: Future     Number of Occurrences: 1     Standing Expiration Date: 01/04/2016  . Erythropoietin    Standing Status: Future     Number of Occurrences: 1     Standing Expiration Date: 01/04/2016  . Iron and TIBC    Standing Status: Future     Number of Occurrences: 1     Standing Expiration Date: 01/04/2016  . Ferritin    Standing Status: Future     Number of Occurrences: 1     Standing Expiration Date: 01/04/2016  . Vitamin B12    Standing Status: Future     Number of Occurrences: 1     Standing Expiration Date: 01/04/2016  . Folate    Standing Status: Future     Number of Occurrences: 1     Standing Expiration Date: 01/04/2016  . Lactate dehydrogenase    Standing Status: Future     Number of Occurrences: 1     Standing Expiration Date: 01/04/2016  . Haptoglobin    Standing Status: Future     Number of Occurrences: 1     Standing Expiration Date: 01/04/2016  . Comprehensive metabolic panel    Standing Status: Future     Number of Occurrences: 1     Standing Expiration Date: 01/04/2016  . Pathologist smear review    Standing Status: Future     Number of Occurrences: 1     Standing Expiration Date: 01/04/2016  . TSH    Standing Status: Future     Number of Occurrences: 1     Standing Expiration Date: 01/04/2016  .  CBC with Differential    Standing Status: Future     Number of Occurrences: 1      Standing Expiration Date: 01/04/2016  . Pathologist smear review    All questions were answered. The patient knows to call the clinic with any problems, questions or concerns.  This note was electronically signed.    Molli Hazard, MD MD 01/26/2015 9:29 AM

## 2015-02-02 ENCOUNTER — Ambulatory Visit (HOSPITAL_COMMUNITY): Payer: Self-pay | Admitting: Hematology & Oncology

## 2015-02-03 ENCOUNTER — Other Ambulatory Visit (HOSPITAL_COMMUNITY): Payer: Self-pay

## 2015-02-03 ENCOUNTER — Ambulatory Visit (HOSPITAL_COMMUNITY): Payer: Self-pay | Admitting: Oncology

## 2015-02-08 ENCOUNTER — Encounter (HOSPITAL_BASED_OUTPATIENT_CLINIC_OR_DEPARTMENT_OTHER): Payer: Medicare Other | Admitting: Hematology & Oncology

## 2015-02-08 ENCOUNTER — Encounter (HOSPITAL_BASED_OUTPATIENT_CLINIC_OR_DEPARTMENT_OTHER): Payer: Medicare Other

## 2015-02-08 ENCOUNTER — Telehealth (HOSPITAL_COMMUNITY): Payer: Self-pay | Admitting: *Deleted

## 2015-02-08 ENCOUNTER — Encounter (HOSPITAL_COMMUNITY): Payer: Self-pay | Admitting: Hematology & Oncology

## 2015-02-08 VITALS — BP 152/62 | HR 73 | Temp 98.6°F | Resp 16

## 2015-02-08 DIAGNOSIS — C9000 Multiple myeloma not having achieved remission: Secondary | ICD-10-CM

## 2015-02-08 DIAGNOSIS — D649 Anemia, unspecified: Secondary | ICD-10-CM

## 2015-02-08 DIAGNOSIS — D696 Thrombocytopenia, unspecified: Secondary | ICD-10-CM

## 2015-02-08 LAB — CBC WITH DIFFERENTIAL/PLATELET
BASOS ABS: 0 10*3/uL (ref 0.0–0.1)
Basophils Relative: 0 % (ref 0–1)
EOS ABS: 0.1 10*3/uL (ref 0.0–0.7)
Eosinophils Relative: 2 % (ref 0–5)
HCT: 19 % — ABNORMAL LOW (ref 36.0–46.0)
HEMOGLOBIN: 6.6 g/dL — AB (ref 12.0–15.0)
Lymphocytes Relative: 37 % (ref 12–46)
Lymphs Abs: 2.4 10*3/uL (ref 0.7–4.0)
MCH: 33.2 pg (ref 26.0–34.0)
MCHC: 34.7 g/dL (ref 30.0–36.0)
MCV: 95.5 fL (ref 78.0–100.0)
Monocytes Absolute: 0.4 10*3/uL (ref 0.1–1.0)
Monocytes Relative: 7 % (ref 3–12)
NEUTROS ABS: 3.5 10*3/uL (ref 1.7–7.7)
NEUTROS PCT: 54 % (ref 43–77)
PLATELETS: 108 10*3/uL — AB (ref 150–400)
RBC: 1.99 MIL/uL — ABNORMAL LOW (ref 3.87–5.11)
RDW: 15.7 % — ABNORMAL HIGH (ref 11.5–15.5)
WBC: 6.5 10*3/uL (ref 4.0–10.5)

## 2015-02-08 LAB — BONE MARROW EXAM

## 2015-02-08 LAB — PREPARE RBC (CROSSMATCH)

## 2015-02-08 NOTE — Telephone Encounter (Signed)
..  CRITICAL VALUE ALERT Critical value received:  Hgb 6.6 Date of notification:  02/08/2015 Time of notification: 0937 Critical value read back:  Yes.   Nurse who received alert:  T.Abdelaziz Westenberger RN MD notified (1st page):  Dr. Whitney Muse and Mickie Kay RN

## 2015-02-08 NOTE — Progress Notes (Signed)
[name] [address] [place] [address]    DIAGNOSIS:  Anemia IgG 10,600 mg/dl with total protein 13.4 g/dl 8.68 g/dl monoclonal protein IgG kappa Beta 2 microglobulin at 8.1 mg/L  CURRENT THERAPY: Evalutation  INTERVAL HISTORY: [name] 81 y.o. female returns for a bone marrow biopsy and aspirate. She based upon laboratory studies has myeloma. She also has dementia, her daughter is very involved in her care, but has difficulties because of her work schedule giving the patient back and forth to the clinic on a regular basis. The patient and her daughter have both consented to a bone marrow biopsy. The risks and benefits of the procedure have been discussed.  MEDICAL HISTORY: Past Medical History  Diagnosis Date  . Chronic leg pain   . Poor historian   . Psychosis   . Schizoaffective disorder   . Hypertension   . High cholesterol   . Dementia   . Thrombocytopenia 11/10/2014  . Normocytic anemia 11/10/2014  . Elevated total protein 11/10/2014    has HEMORRHOIDS, INTERNAL; GERD; OTHER DYSPHAGIA; CERVICAL MUSCLE STRAIN; COLONIC POLYPS, ADENOMATOUS, HX OF; Dementia without behavioral disturbance; Altered mental status; Essential hypertension; Routine gynecological examination; Cervicitis; Vaginal bleeding; Schizoaffective disorder, unspecified type; Vagina bleeding; Anemia; Normocytic anemia; Thrombocytopenia; Elevated total protein; and Constipation on her problem list.     has No Known Allergies.  Ms. Rabel does not currently have medications on file.  SURGICAL HISTORY: Past Surgical History  Procedure Laterality Date  . Tubal ligation    . Foot surgery      SOCIAL HISTORY: History   Social History  . Marital Status: Widowed    Spouse Name: N/A  . Number of Children: N/A  . Years of Education: N/A   Occupational History  . Not on file.   Social History Main Topics  . Smoking status: Never Smoker   . Smokeless tobacco:  Not on file  . Alcohol Use: No  . Drug Use: No  . Sexual Activity: Not Currently    Birth Control/ Protection: Post-menopausal   Other Topics Concern  . Not on file   Social History Narrative    FAMILY HISTORY: Family History  Problem Relation Age of Onset  . Diabetes Mother     Review of Systems  Constitutional: Negative for fever, chills, weight loss and malaise/fatigue.  HENT: Negative for congestion, hearing loss, nosebleeds, sore throat and tinnitus.   Eyes: Negative for blurred vision, double vision, pain and discharge.  Respiratory: Negative for cough, hemoptysis, sputum production, shortness of breath and wheezing.   Cardiovascular: Negative for chest pain, palpitations, claudication, leg swelling and PND.  Gastrointestinal: Negative for heartburn, nausea, vomiting, abdominal pain, diarrhea, constipation, blood in stool and melena.  Genitourinary: Negative for dysuria, urgency, frequency and hematuria.  Musculoskeletal: Negative for myalgias, joint pain and falls.  Skin: Negative for itching and rash.  Neurological: Negative for dizziness, tingling, tremors, sensory change, speech change, focal weakness, seizures, loss of consciousness, weakness and headaches.  Endo/Heme/Allergies: Does not bruise/bleed easily.  Psychiatric/Behavioral: Negative for depression, suicidal ideas, memory loss and substance abuse. The patient is not nervous/anxious and does not have insomnia.     PHYSICAL EXAMINATION  ECOG PERFORMANCE STATUS: 1 - Symptomatic but completely ambulatory  Filed Vitals:   02/08/15 1000  BP: 152/62  Pulse: 73  Temp:   Resp: 16    Physical Exam  Constitutional: She is oriented to person, place, and time and well-developed, well-nourished, and  in no distress.  HENT:  Head: Normocephalic and atraumatic.  Nose: Nose normal.  Mouth/Throat: Oropharynx is clear and moist. No oropharyngeal exudate.  Eyes: Conjunctivae and EOM are normal. Pupils are equal,  round, and reactive to light. Right eye exhibits no discharge. Left eye exhibits no discharge. No scleral icterus.  Neck: Normal range of motion. Neck supple. No tracheal deviation present. No thyromegaly present.  Cardiovascular: Normal rate, regular rhythm and normal heart sounds.  Exam reveals no gallop and no friction rub.   No murmur heard. Pulmonary/Chest: Effort normal and breath sounds normal. She has no wheezes. She has no rales.  Abdominal: Soft. Bowel sounds are normal. She exhibits no distension and no mass. There is no tenderness. There is no rebound and no guarding.  Musculoskeletal: Normal range of motion. She exhibits no edema.  Lymphadenopathy:    She has no cervical adenopathy.  Neurological: She is alert and oriented to person, place, and time. She has normal reflexes. No cranial nerve deficit. Gait normal. Coordination normal.  Skin: Skin is warm and dry. No rash noted.  Psychiatric: Mood, memory, affect and judgment normal.  Nursing note and vitals reviewed.   LABORATORY DATA:  CBC    Component Value Date/Time   WBC 6.5 02/08/2015 0842   RBC 1.99* 02/08/2015 0842   RBC 2.54* 01/04/2015 1700   HGB 6.6* 02/08/2015 0842   HCT 19.0* 02/08/2015 0842   PLT 108* 02/08/2015 0842   MCV 95.5 02/08/2015 0842   MCH 33.2 02/08/2015 0842   MCHC 34.7 02/08/2015 0842   RDW 15.7* 02/08/2015 0842   LYMPHSABS 2.4 02/08/2015 0842   MONOABS 0.4 02/08/2015 0842   EOSABS 0.1 02/08/2015 0842   BASOSABS 0.0 02/08/2015 0842   CMP     Component Value Date/Time   NA 127* 01/04/2015 1700   K 3.9 01/04/2015 1700   CL 102 01/04/2015 1700   CO2 27 01/04/2015 1700   GLUCOSE 89 01/04/2015 1700   BUN 29* 01/04/2015 1700   CREATININE 1.42* 01/04/2015 1700   CALCIUM 7.8* 01/04/2015 1700   PROT >12.0* 01/04/2015 1700   ALBUMIN 2.5* 01/04/2015 1700   AST 14 01/04/2015 1700   ALT 10 01/04/2015 1700   ALKPHOS 49 01/04/2015 1700   BILITOT 0.5 01/04/2015 1700   GFRNONAA 33* 01/04/2015  1700   GFRAA 39* 01/04/2015 1700      Bone Marrow Biopsy and Aspiration Procedure Note   Informed consent was obtained and potential risks including bleeding, infection and pain were reviewed with the patient. I verified that the patient has been fasting since midnight.  The patient's name, date of birth, identification, consent and allergies were verified prior to the start of procedure and time out was performed.  The right posterior iliac crest was chosen as the site of biopsy.  The skin was prepped with Betadine solution.   8 cc of 1% lidocaine was used to provide local anaesthesia.   10 cc of bone marrow aspirate was obtained followed by 1/2 inch biopsy.  Pressure was applied to the biopsy site and bandage was placed over the biopsy site. Patient was made to lie on back for 30 mins prior to discharge.  The procedure was tolerated well. COMPLICATIONS: None BLOOD LOSS: none The patient was discharged home in stable condition with a 2 week follow up to review results. She was provided with post bone marrow biopsy instructions and instructed to call if there was any bleeding or worsening pain. The patient and her family  were advised to leave the pressure bandage in place until tomorrow a.m. Specimens sent for flow cytometry, cytogenetics and additional studies.  ASSESSMENT and THERAPY PLAN:   Multiple myeloma  Patient tolerated the bone marrow biopsy without difficulty. We will be able to obtain fish results regarding cytogenetics. In regards to therapy her daughter and I have briefly discussed oral options as that will be the best for the patient and family in regards to transportation.  Anemia  Hemoglobin is 6.6 today. The patient is asymptomatic and vitals are stable. They would like to come back first thing in the morning for transfusion. We have arrange this for them. They were advised to take her promptly to the emergency room for shortness of breath, chest pain,  dizziness, or any other symptoms of concern.   All questions were answered. The patient knows to call the clinic with any problems, questions or concerns. We can certainly see the patient much sooner if necessary.  Molli Hazard 02/08/2015

## 2015-02-08 NOTE — Progress Notes (Signed)
Labs drawn

## 2015-02-08 NOTE — Progress Notes (Signed)
[place] BONE MARROW BIOPSY/ASPIRATE PROGRESS NOTE  [name] presents for Bone Marrow biopsy per MD orders. [name] verbalized understanding of procedure. Consent reviewed and signed by daughter. [name] positioned supine for procedure. Time-out performed and Bone Marrow Checklist. Procedure began at 0847. Xylocaine 1% 10 cc used for local and administered to patient by Dr. Whitney Muse. Procedure completed at 0905. Patient tolerated well. Pressure dressing applied to the left hip with instructions to leave in place for 24 hours. Patient instructed to report any bleeding that saturates dressing and to take pain medication tylenol or hydorcodone as directed. Dressing dry and intact to the left hip on discharge.

## 2015-02-08 NOTE — Patient Instructions (Addendum)
[place] at [facility] Discharge Instructions   [facility] Discharge Instructions for Post Bone Marrow Procedure  Today you had a bone marrow biopsy and aspirate of the left hip   Please keep the pressure dressing in place for at least 24 hours.  Have someone check your dressing periodically for bleeding.  If needed you can reapply a pressure dressing to the site.  Take pain medication tylenol or hydrocodone as needed.  IF BLEEDING REOCCURS THAT SHOULD BE REPORTED IMMEDIATELY. Call the [facility] at [phone] if during business hours. Or report to the Emergency Room.  Your hemoglobin is low and we need to give you some blood tomorrow. Will also see you back in 1 week to check your blood work and MD will see you in 2 weeks to discuss results.         Thank you for choosing [place] at [facility] to provide your oncology and hematology care.  To afford each patient quality time with our provider, please arrive at least 15 minutes before your scheduled appointment time.    You need to re-schedule your appointment should you arrive 10 or more minutes late.  We strive to give you quality time with our providers, and arriving late affects you and other patients whose appointments are after yours.  Also, if you no show three or more times for appointments you may be dismissed from the clinic at the providers discretion.     Again, thank you for choosing [facility].  Our hope is that these requests will decrease the amount of time that you wait before being seen by our physicians.       _____________________________________________________________  Should you have questions after your visit to [facility], please contact our office at [phone] between the hours of 8:30 a.m. and 4:30 p.m.  Voicemails left after 4:30 p.m. will not be returned until the following business day.  For  prescription refill requests, have your pharmacy contact our office.

## 2015-02-09 ENCOUNTER — Other Ambulatory Visit (HOSPITAL_COMMUNITY): Payer: Self-pay

## 2015-02-09 ENCOUNTER — Encounter (HOSPITAL_BASED_OUTPATIENT_CLINIC_OR_DEPARTMENT_OTHER): Payer: Medicare Other

## 2015-02-09 VITALS — BP 149/63 | HR 88 | Temp 98.4°F | Resp 16

## 2015-02-09 DIAGNOSIS — D649 Anemia, unspecified: Secondary | ICD-10-CM

## 2015-02-09 DIAGNOSIS — D696 Thrombocytopenia, unspecified: Secondary | ICD-10-CM

## 2015-02-09 MED ORDER — ACETAMINOPHEN 325 MG PO TABS
650.0000 mg | ORAL_TABLET | Freq: Once | ORAL | Status: AC
  Administered 2015-02-09: 650 mg via ORAL

## 2015-02-09 MED ORDER — DIPHENHYDRAMINE HCL 25 MG PO CAPS
ORAL_CAPSULE | ORAL | Status: AC
  Filled 2015-02-09: qty 1

## 2015-02-09 MED ORDER — SODIUM CHLORIDE 0.9 % IJ SOLN
10.0000 mL | INTRAMUSCULAR | Status: AC | PRN
  Administered 2015-02-09: 10 mL

## 2015-02-09 MED ORDER — DIPHENHYDRAMINE HCL 25 MG PO CAPS
25.0000 mg | ORAL_CAPSULE | Freq: Once | ORAL | Status: AC
Start: 2015-02-09 — End: 2015-02-09
  Administered 2015-02-09: 25 mg via ORAL

## 2015-02-09 MED ORDER — ACETAMINOPHEN 325 MG PO TABS
ORAL_TABLET | ORAL | Status: AC
  Filled 2015-02-09: qty 2

## 2015-02-09 MED ORDER — SODIUM CHLORIDE 0.9 % IV SOLN
250.0000 mL | Freq: Once | INTRAVENOUS | Status: AC
Start: 2015-02-09 — End: 2015-02-09
  Administered 2015-02-09: 250 mL via INTRAVENOUS

## 2015-02-09 NOTE — Progress Notes (Signed)
Tolerated blood transfusion well. 

## 2015-02-09 NOTE — Patient Instructions (Signed)
[place] at [facility] Discharge Instructions  RECOMMENDATIONS MADE BY THE CONSULTANT AND ANY TEST RESULTS WILL BE SENT TO YOUR REFERRING PHYSICIAN.  Today you received 2 units red blood cell transfusion. Report any issues/concerns to clinic as needed prior to appointment. Return as scheduled.  Thank you for choosing [place] at [facility] to provide your oncology and hematology care.  To afford each patient quality time with our provider, please arrive at least 15 minutes before your scheduled appointment time.    You need to re-schedule your appointment should you arrive 10 or more minutes late.  We strive to give you quality time with our providers, and arriving late affects you and other patients whose appointments are after yours.  Also, if you no show three or more times for appointments you may be dismissed from the clinic at the providers discretion.     Again, thank you for choosing [facility].  Our hope is that these requests will decrease the amount of time that you wait before being seen by our physicians.       _____________________________________________________________  Should you have questions after your visit to [facility], please contact our office at [phone] between the hours of 8:30 a.m. and 4:30 p.m.  Voicemails left after 4:30 p.m. will not be returned until the following business day.  For prescription refill requests, have your pharmacy contact our office.

## 2015-02-10 LAB — TYPE AND SCREEN
ABO/RH(D): O POS
Antibody Screen: NEGATIVE
UNIT DIVISION: 0
UNIT DIVISION: 0

## 2015-02-10 MED ORDER — LENALIDOMIDE 10 MG PO CAPS: 10.0000 mg | ORAL_CAPSULE | Freq: Every day | ORAL | Status: DC

## 2015-02-10 NOTE — Progress Notes (Signed)
[place] Progress Note  Patient Care Team: [name], DO as PCP - General (Family Medicine) Daneil Dolin, MD as Consulting Physician (Gastroenterology)  CHIEF COMPLAINTS/PURPOSE OF CONSULTATION:  Anemia  HISTORY OF PRESENTING ILLNESS:   [name] 81 y.o. female is here because of anemia. CBCs dating back several months show a hemoglobin as low as 7.2., Mild thrombocytopenia is also noted with a platelet count in the 90,000 range. Additional chart review shows a hemoglobin of 11.7 in July 2013. Z16 and folic acid were normal in December. Iron studies were consistent with anemia of chronic disease. Fecal occult blood testing was negative. She has been transfused on several occasions.  She was seen in gynecology in September of last year for vaginal bleeding and was noted to have cervicitis secondary to perform body. She was admitted in September of last year for anemia. She was again admitted in December for hemoglobin of 6.8. With no obvious source of blood loss. She was noted to have chronic thrombocytopenia and also elevated total protein at that time.  Her daughter states her mother has significant dementia, she also has a diagnosis of schizoaffective disorder. She resides at the [facility]. Her daughter takes her home several weekends a month. She states her mother does not like to eat the food at the [facility] but eats very well when she is home with her. Her daughter also states she is willing to proceed with a workup of her mother's anemia.  They return today for additional recommendations and review of previously obtained laboratory studies. The patient has no complaints today.  MEDICAL HISTORY:  Past Medical History  Diagnosis Date  . Chronic leg pain   . Poor historian   . Psychosis   . Schizoaffective disorder   . Hypertension   . High cholesterol   . Dementia   . Thrombocytopenia 11/10/2014  . Normocytic anemia 11/10/2014  . Elevated  total protein 11/10/2014    SURGICAL HISTORY: Past Surgical History  Procedure Laterality Date  . Tubal ligation    . Foot surgery      SOCIAL HISTORY: History   Social History  . Marital Status: Widowed    Spouse Name: N/A  . Number of Children: N/A  . Years of Education: N/A   Occupational History  . Not on file.   Social History Main Topics  . Smoking status: Never Smoker   . Smokeless tobacco: Not on file  . Alcohol Use: No  . Drug Use: No  . Sexual Activity: Not Currently    Birth Control/ Protection: Post-menopausal   Other Topics Concern  . Not on file   Social History Narrative   [facility] [place]. She has 8 children.  5 boys and 3 girls. She is widowed. Born in [place] HISTORY: Family History  Problem Relation Age of Onset  . Diabetes Mother    indicated that her mother is deceased. She indicated that her father is deceased.   Her mother died at a very young age in 9678 from an uncertain cause. Her father died in his 93Y from complications of Alzheimer's. She has 2 sisters that are living, one sister that is deceased, and all of her brothers are deceased. Her daughter denies any significant history of cancer in the family.  ALLERGIES:  has No Known Allergies.  MEDICATIONS:  Current Outpatient Prescriptions  Medication Sig Dispense Refill  . acetaminophen (TYLENOL) 500 MG  tablet Take 500 mg by mouth 3 (three) times daily as needed for mild pain.     Marland Kitchen amLODipine (NORVASC) 5 MG tablet Take 1 tablet (5 mg total) by mouth daily.    . clonazePAM (KLONOPIN) 0.25 MG disintegrating tablet Take 0.25 mg by mouth at bedtime.    . fentaNYL (DURAGESIC - DOSED MCG/HR) 12 MCG/HR Place 1 patch (12.5 mcg total) onto the skin every 3 (three) days. 5 patch 0  . ferrous sulfate 325 (65 FE) MG tablet Take 1 tablet (325 mg total) by mouth 3 (three) times daily with meals.    . fluticasone (FLONASE) 50 MCG/ACT nasal spray Place 2 sprays  into both nostrils daily.    Marland Kitchen HYDROcodone-acetaminophen (NORCO/VICODIN) 5-325 MG per tablet Take 1 tablet by mouth every 4 (four) hours as needed (pain). 20 tablet 0  . magnesium hydroxide (MILK OF MAGNESIA) 400 MG/5ML suspension Take 30 mLs by mouth daily as needed (bowel mobility).     . Melatonin 3 MG TABS Take 1 tablet by mouth at bedtime as needed (insomnia).     . Multiple Vitamins-Minerals (MULTIVITAMIN WITH MINERALS) tablet Take 1 tablet by mouth daily.    . polyethylene glycol (MIRALAX / GLYCOLAX) packet Take 17 g by mouth daily.     No current facility-administered medications for this visit.    Review of Systems  Constitutional: Negative for fever, chills, weight loss and malaise/fatigue.  HENT: Negative for congestion, hearing loss, nosebleeds, sore throat and tinnitus.   Eyes: Negative for blurred vision, double vision, pain and discharge.  Respiratory: Negative for cough, hemoptysis, sputum production, shortness of breath and wheezing.   Cardiovascular: Negative for chest pain, palpitations, claudication, leg swelling and PND.  Gastrointestinal: Negative for heartburn, nausea, vomiting, abdominal pain, diarrhea, constipation, blood in stool and melena.  Genitourinary: Negative for dysuria, urgency, frequency and hematuria.  Musculoskeletal: Negative for myalgias, joint pain and falls.  Skin: Negative for itching and rash.  Neurological: Negative for dizziness, tingling, tremors, sensory change, speech change, focal weakness, seizures, loss of consciousness, weakness and headaches.  Endo/Heme/Allergies: Does not bruise/bleed easily.  Psychiatric/Behavioral: Positive for memory loss. Negative for depression, suicidal ideas and substance abuse. The patient is nervous/anxious. The patient does not have insomnia.     PHYSICAL EXAMINATION: ECOG PERFORMANCE STATUS: 1 - Symptomatic but completely ambulatory  Filed Vitals:   01/20/15 1100  BP: 168/65  Pulse: 85  Temp: 98.6 F  (37 C)  Resp: 20   Filed Weights   01/20/15 1100  Weight: 128 lb 8 oz (58.287 kg)     Physical Exam  Constitutional: She is oriented to person, place, and time and well-developed, well-nourished, and in no distress.  HENT:  Head: Normocephalic and atraumatic.  Nose: Nose normal.  Mouth/Throat: Oropharynx is clear and moist. No oropharyngeal exudate.  Eyes: Conjunctivae and EOM are normal. Pupils are equal, round, and reactive to light. Right eye exhibits no discharge. Left eye exhibits no discharge. No scleral icterus.  Neck: Normal range of motion. Neck supple. No tracheal deviation present. No thyromegaly present.  Cardiovascular: Normal rate, regular rhythm and normal heart sounds.  Exam reveals no gallop and no friction rub.   No murmur heard. Pulmonary/Chest: Effort normal and breath sounds normal. She has no wheezes. She has no rales.  Abdominal: Soft. Bowel sounds are normal. She exhibits no distension and no mass. There is no tenderness. There is no rebound and no guarding.  Musculoskeletal: Normal range of motion. She exhibits no edema.  Lymphadenopathy:    She has no cervical adenopathy.  Neurological: She is alert and oriented to person, place, and time. She has normal reflexes. No cranial nerve deficit. Gait normal. Coordination normal.  Skin: Skin is warm and dry. No rash noted.  Psychiatric: Mood and affect normal.  Nursing note and vitals reviewed.    LABORATORY DATA:  I have reviewed the data as listed Results for ARONDA, BURFORD (MRN [id number]) as of 02/16/2015 06:29  Ref. Range 01/20/2015 12:12 01/20/2015 12:13 01/20/2015 12:13  Comment No range found   (NOTE)  Beta-2 Microglobulin Latest Range: 0.6-2.4 mg/L 8.1 (H)    Total Protein ELP Latest Range: 6.0-8.3 g/dL  13.4 (H) 13.4 (H)  Albumin ELP Latest Range: 55.8-66.1 %   23.3 (L)  Alpha-1-Globulin Latest Range: 2.9-4.9 %   1.8 (L)  Alpha-2-Globulin Latest Range: 7.1-11.8 %   3.9 (L)  Beta Globulin Latest Range:  4.7-7.2 %   1.5 (L)  Beta 2 Latest Range: 3.2-6.5 %   1.0 (L)  Gamma Globulin Latest Range: 11.1-18.8 %   68.5 (H)  M-SPIKE, % Latest Units: g/dL   8.68  SPE Interp. No range found   (NOTE)  IgG (Immunoglobin G), Serum Latest Range: 670 052 4600 mg/dL 9990 (H) 10600 (H)   IgA Latest Range: 69-380 mg/dL 18 (L) 18 (L)   IgM, Serum Latest Range: 52-322 mg/dL <5 (L) <5 (L)   Kappa free light chain Latest Range: 3.30-19.40 mg/L 257.41 (H)    Lamda free light chains Latest Range: 5.71-26.30 mg/L 2.69 (L)    Kappa, lamda light chain ratio Latest Range: 0.26-1.65  95.69 (H)      ASSESSMENT & PLAN:   Anemia  I suspect she has multiple myeloma based upon her elevated total protein and transfusion dependent anemia. There are difficulties with transportation since her daughter works and the patient is unable to drive. We will obtain myeloma labs today. I spent time in discussion with the daughter a diagnosis of multiple myeloma and also the need for a bone marrow biopsy. She needs several days to look her schedule in order to arrange for the bone marrow. She would like to do it here in the clinic. She does not feel able to transport her mother to [place]. Other than the patient's dementia, she is in reasonably good health. We spent time today discussing potential treatment options. Her daughter will call us in the next several days at which time I will schedule her bone marrow biopsy. We will make additional recommendations moving forward.   Orders Placed This Encounter  Procedures  . Kappa/lambda light chains    Standing Status: Future     Number of Occurrences: 1     Standing Expiration Date: 01/20/2016  . Beta 2 microglobuline, serum    Standing Status: Future     Number of Occurrences: 1     Standing Expiration Date: 01/20/2016  . IgG, IgA, IgM    Standing Status: Future     Number of Occurrences: 1     Standing Expiration Date: 01/20/2016  . Immunofixation electrophoresis    Standing Status:  Future     Number of Occurrences: 1     Standing Expiration Date: 01/20/2016  . Protein electrophoresis, serum    Standing Status: Future     Number of Occurrences: 1     Standing Expiration Date: 01/20/2016    All questions were answered. The patient knows to call the clinic with any problems, questions or concerns.  This note  was electronically signed.    Molli Hazard, MD MD 02/10/2015 3:52 PM

## 2015-02-12 LAB — CHROMOSOME ANALYSIS, BONE MARROW

## 2015-02-16 ENCOUNTER — Encounter (HOSPITAL_COMMUNITY): Payer: Medicare Other | Attending: Family Medicine

## 2015-02-16 DIAGNOSIS — D649 Anemia, unspecified: Secondary | ICD-10-CM | POA: Diagnosis present

## 2015-02-16 LAB — CBC
HCT: 24.7 % — ABNORMAL LOW (ref 36.0–46.0)
Hemoglobin: 8.4 g/dL — ABNORMAL LOW (ref 12.0–15.0)
MCH: 32.6 pg (ref 26.0–34.0)
MCHC: 34 g/dL (ref 30.0–36.0)
MCV: 95.7 fL (ref 78.0–100.0)
Platelets: 76 10*3/uL — ABNORMAL LOW (ref 150–400)
RBC: 2.58 MIL/uL — AB (ref 3.87–5.11)
RDW: 15 % (ref 11.5–15.5)
WBC: 6.4 10*3/uL (ref 4.0–10.5)

## 2015-02-16 NOTE — Progress Notes (Signed)
Labs drawn

## 2015-02-17 ENCOUNTER — Encounter (HOSPITAL_COMMUNITY): Payer: Self-pay | Admitting: Oncology

## 2015-02-17 ENCOUNTER — Other Ambulatory Visit (HOSPITAL_COMMUNITY): Payer: Self-pay | Admitting: Oncology

## 2015-02-17 DIAGNOSIS — C9000 Multiple myeloma not having achieved remission: Secondary | ICD-10-CM

## 2015-02-17 HISTORY — DX: Multiple myeloma not having achieved remission: C90.00

## 2015-02-17 MED ORDER — LENALIDOMIDE 10 MG PO CAPS: 10.0000 mg | ORAL_CAPSULE | Freq: Every day | ORAL | Status: DC

## 2015-02-17 MED ORDER — WARFARIN SODIUM 1 MG PO TABS: 1.0000 mg | ORAL_TABLET | Freq: Every day | ORAL | Status: DC

## 2015-02-22 ENCOUNTER — Telehealth (HOSPITAL_COMMUNITY): Payer: Self-pay | Admitting: Hematology & Oncology

## 2015-02-22 ENCOUNTER — Encounter (HOSPITAL_COMMUNITY): Payer: Medicare Other

## 2015-02-22 ENCOUNTER — Other Ambulatory Visit (HOSPITAL_COMMUNITY): Payer: Self-pay | Admitting: Oncology

## 2015-02-22 DIAGNOSIS — C9000 Multiple myeloma not having achieved remission: Secondary | ICD-10-CM

## 2015-02-22 MED ORDER — ONDANSETRON HCL 8 MG PO TABS: 8.0000 mg | ORAL_TABLET | Freq: Three times a day (TID) | ORAL | Status: DC | PRN

## 2015-02-22 MED ORDER — DEXAMETHASONE 4 MG PO TABS: 40.0000 mg | ORAL_TABLET | ORAL | Status: DC

## 2015-02-22 MED ORDER — LENALIDOMIDE 10 MG PO CAPS: 10.0000 mg | ORAL_CAPSULE | Freq: Every day | ORAL | Status: DC

## 2015-02-22 NOTE — Patient Instructions (Addendum)
[facility]   CHEMOTHERAPY INSTRUCTIONS  Revlimid - chemo med - side effects: neutropenia (low white blood cells), thrombocytopenia (low platelets), deep vein thrombosis (blood clot in extremity), pulmonary embolism (blood clot in lung), itching, rash, dry skin, diarrhea, constipation, nausea, vomiting, fatigue, dizziness, headache, muscle cramps/aches, inflammation of the nose and throat, fever, upper respiratory tract infections, cough.   Females and Males Do not share REVLIMID with other people. It may cause birth defects and other serious problems  Do not donate blood while you take REVLIMID, during any breaks (interruptions) in your treatment, and for 4 weeks after stopping REVLIMID. If someone who is pregnant gets your donated blood, her baby may be exposed to REVLIMID and may be born with birth defects  Low white blood cells (neutropenia) and low platelets (thrombocytopenia) REVLIMID causes low white blood cells and low platelets in most people. You may need a blood transfusion or certain medicines if your blood counts drop too low  Your healthcare provider should check your blood counts often especially during the first several months of treatment with REVLIMID, and then at least monthly. Tell your healthcare provider if you develop any bleeding or bruising during treatment with REVLIMID  Blood clots Blood clots in the arteries, veins, and lungs happen more often in people who take REVLIMID  Risk is even higher for people with multiple myeloma taking REVLIMID with dexamethasone  Heart attacks and stroke also happen more often in people taking REVLIMID with dexamethasone  To reduce this increased risk, most people who take REVLIMID will also take a blood thinner medicine  Before taking REVLIMID tell your healthcare provider: if you have had a blood clot in the past, have high blood pressure, you smoke, you have been told you have high level of fat in your  blood (hyperlipidemia), and all medicines you take. Certain other medicines can also increase your risk for blood clots  Call your healthcare provider or get medical help right away if you get any of the following signs or symptoms during treatment with REVLIMID: Blood clot in lung, arm or leg: shortness of breath, chest pain, or arm or leg swelling. Heart attack: chest pain that may spread to arms, neck, jaw, back or stomach area, feeling sweaty, shortness of breath, feeling sick or vomiting. Stroke: sudden numbness or weakness, especially on one side of the body, severe headache or confusion, or problems with vision, speech or balance   Other important information about REVLIMID  Swallow REVLIMID capsules whole with water once a day. Do not open, break, or chew your capsules  Do not open the REVLIMID capsules or handle them any more than needed. If you touch a broken REVLIMID capsule or the medicine in the capsule, wash the area of your body with soap and water  If you miss a dose of REVLIMID, and it has been less than 12 hours since your regular time, take it as soon as you remember. If it has been more than 12 hours, just skip your missed dose. Do not take 2 doses at the same time  Caregivers wear gloves when handling this medication Leave medication in it's original packing (bottle, blister pack, etc) until it is ready for administration        *Your dosage of Revlimid will be 1 tablet ($RemoveB'10mg'gqdlZDhh$  capsule) daily x 21 days in a row. Rest for 7 days (don't take Revlimid). Repeat cycle*.   EDUCATIONAL MATERIALS GIVEN AND REVIEWED: Specific Instructions Sheets: Revlimid, Dexamethasone,  Zofran   SELF CARE ACTIVITIES WHILE ON CHEMOTHERAPY: Increase your fluid intake 48 hours prior to treatment and drink at least 2 quarts per day after treatment., No alcohol intake., No aspirin or other medications unless approved by your oncologist., Eat foods that are light and easy to digest., Eat foods at cold or  room temperature., No fried, fatty, or spicy foods immediately before or after treatment., Have teeth cleaned professionally before starting treatment. Keep dentures and partial plates clean., Use soft toothbrush and do not use mouthwashes that contain alcohol. Biotene is a good mouthwash that is available at most pharmacies or may be ordered by calling [phone]., Use warm salt water gargles (1 teaspoon salt per 1 quart warm water) before and after meals and at bedtime. Or you may rinse with 2 tablespoons of three -percent hydrogen peroxide mixed in eight ounces of water., Always use sunscreen with SPF (Sun Protection Factor) of 30 or higher., Use your nausea medication as directed to prevent nausea., Use your stool softener or laxative as directed to prevent constipation. and Use your anti-diarrheal medication as directed to stop diarrhea.  Please wash your hands for at least 30 seconds using warm soapy water. Handwashing is the #1 way to prevent the spread of germs. Stay away from sick people or people who are getting over a cold. If you develop respiratory systems such as green/yellow mucus production or productive cough or persistent cough let us know and we will see if you need an antibiotic. It is a good idea to keep a pair of gloves on when going into grocery stores/[employer] to decrease your risk of coming into contact with germs on the carts, etc. Carry alcohol hand gel with you at all times and use it frequently if out in public. All foods need to be cooked thoroughly. No raw foods. No medium or undercooked meats, eggs. If your food is cooked medium well, it does not need to be hot pink or saturated with bloody liquid at all. Vegetables and fruits need to be washed/rinsed under the faucet with a dish detergent before being consumed. You can eat raw fruits and vegetables unless we tell you otherwise but it would be best if you cooked them or bought frozen. Do not eat off of salad bars or hot bars  unless you really trust the cleanliness of the restaurant. If you need dental work, please let Dr. Whitney Muse know before you go for your appointment so that we can coordinate the best possible time for you in regards to your chemo regimen. You need to also let your dentist know that you are actively taking chemo. We may need to do labs prior to your dental appointment. We also want your bowels moving at least every other day. If this is not happening, we need to know so that we can get you on a bowel regimen to help you go.      MEDICATIONS: You have been given prescriptions for the following medications:  Over-the-Counter Meds:   Zofran $Remov'8mg'AgurJy$  tablet. Take 1 tablet every 8 hours as needed for nausea/vomiting.  Dexamethasone $RemoveBefore'4mg'lTOQzPWoUGrDg$  tablet. Take 10 tablets ($RemoveBef'40mg'DzlVIcmmbs$ ) by mouth once a week. (choose one day a week and administer 10 tablets). The dexamethasone will make the Revlimid work better and it also helps to eat away at myeloma cells.  Revlimid $RemoveB'10mg'PtSGSTPN$  capsule. Take 1 capsule by mouth daily x 21 days in a row. Rest for 7 days (don't take Revlimid). Then repeat cycle.   Over-the-Counter Meds:  Senna - this is a mild laxative used to treat mild constipation. May take 4 tabs by mouth daily or up to twice a day as needed for mild constipation.  Milk of Magnesia - this is a laxative used to treat moderate to severe constipation. May take 2-4 tablespoons every 8 hours as needed. May increase to 8 tablespoons x 1 dose and if no bowel movement call the [facility].  Imodium - this is for diarrhea. Take 2 tabs after 1st loose stool and then 1 tab every 2 hours until you go a total of 12 hours without a loose stool. Call [facility] if loose stools continue.   SYMPTOMS TO REPORT AS SOON AS POSSIBLE AFTER TREATMENT:  FEVER GREATER THAN 100.5 F  CHILLS WITH OR WITHOUT FEVER  NAUSEA AND VOMITING THAT IS NOT CONTROLLED WITH YOUR NAUSEA MEDICATION  UNUSUAL SHORTNESS OF BREATH  UNUSUAL BRUISING OR  BLEEDING  TENDERNESS IN MOUTH AND THROAT WITH OR WITHOUT PRESENCE OF ULCERS  URINARY PROBLEMS  BOWEL PROBLEMS  UNUSUAL RASH    Wear comfortable clothing and clothing appropriate for easy access to any Portacath or PICC line. Let us know if there is anything that we can do to make your therapy better!      I have been informed and understand all of the instructions given to me and have received a copy. I have been instructed to call the clinic [phone] or my family physician as soon as possible for continued medical care, if indicated. I do not have any more questions at this time but understand that I may call the [facility] or the Patient Navigator at [phone] during office hours should I have questions or need assistance in obtaining follow-up care.           Lenalidomide Oral Capsules What is this medicine? LENALIDOMIDE (len a LID oh mide) is a chemotherapy drug that targets specific proteins within cancer cells and stops the cancer cell from growing. It is used to treat multiple myeloma, mantle cell lymphoma, and some myelodysplastic syndromes that cause severe anemia requiring blood transfusions. This medicine may be used for other purposes; ask your health care provider or pharmacist if you have questions. COMMON BRAND NAME(S): Revlimid What should I tell my health care provider before I take this medicine? They need to know if you have any of these conditions: -blood clots in the legs or the lungs -high blood pressure -high cholesterol -infection -irregular monthly periods or menstrual cycles -kidney disease -liver disease -smoke tobacco -thyroid disease -an unusual or allergic reaction to lenalidomide, other medicines, foods, dyes, or preservatives -pregnant or trying to get pregnant -breast-feeding How should I use this medicine? Take this medicine by mouth with a glass of water. Follow the directions on the prescription label. Do not cut,  crush, or chew this medicine. Take your medicine at regular intervals. Do not take it more often than directed. Do not stop taking except on your doctor's advice. A MedGuide will be given with each prescription and refill. Read this guide carefully each time. The MedGuide may change frequently. Talk to your pediatrician regarding the use of this medicine in children. Special care may be needed. Overdosage: If you think you have taken too much of this medicine contact a poison control center or emergency room at once. NOTE: This medicine is only for you. Do not share this medicine with others. What if I miss a dose? If you miss a dose, take it as  soon as you can. If your next dose is to be taken in less than 12 hours, then do not take the missed dose. Take the next dose at your regular time. Do not take double or extra doses. What may interact with this medicine? This medicine may interact with the following medications: -digoxin -medicines that increase the risk of thrombosis like estrogens or erythropoietic agents (e.g., epoetin alfa and darbepoetin alfa) -warfarin This list may not describe all possible interactions. Give your health care provider a list of all the medicines, herbs, non-prescription drugs, or dietary supplements you use. Also tell them if you smoke, drink alcohol, or use illegal drugs. Some items may interact with your medicine. What should I watch for while using this medicine? Visit your doctor for regular check ups. Tell your doctor or healthcare professional if your symptoms do not start to get better or if they get worse. You will need to have important blood work done while you are taking this medicine. This medicine is available only through a special program. Doctors, pharmacies, and patients must meet all of the conditions of the program. Your health care provider will help you get signed up with the program if you need this medicine. Through the program you will only  receive up to a 28 day supply of the medicine at one time. You will need a new prescription for each refill. This medicine can cause birth defects. Do not get pregnant while taking this drug. Females with child-bearing potential will need to have 2 negative pregnancy tests before starting this medicine. Pregnancy testing must be done every 2 to 4 weeks as directed while taking this medicine. Use 2 reliable forms of birth control together while you are taking this medicine and for 1 month after you stop taking this medicine. If you think that you might be pregnant talk to your doctor right away. Men must use a latex condom during sexual contact with a woman while taking this medicine and for 28 days after you stop taking this medicine. A latex condom is needed even if you have had a vasectomy. Contact your doctor right away if your partner becomes pregnant. Do not donate sperm while taking this medicine and for 28 days after you stop taking this medicine. Do not give blood while taking the medicine and for 1 month after completion of treatment to avoid exposing pregnant women to the medicine through the donated blood. Talk to your doctor about your risk of cancer. You may be more at risk for certain types of cancers if you take this medicine. What side effects may I notice from receiving this medicine? Side effects that you should report to your doctor or health care professional as soon as possible: -allergic reactions like skin rash, itching or hives, swelling of the face, lips, or tongue -breathing problems -chest pain or tightness -fast, irregular heartbeat -low blood counts - this medicine may decrease the number of white blood cells, red blood cells and platelets. You may be at increased risk for infections and bleeding. -seizures -signs and symptoms of bleeding such as bloody or black, tarry stools; red or dark-brown urine; spitting up blood or brown material that looks like coffee grounds; red  spots on the skin; unusual bruising or bleeding from the eye, gums, or nose -signs and symptoms of a blood clot such as breathing problems; changes in vision; chest pain; severe, sudden headache; pain, swelling, warmth in the leg; trouble speaking; sudden numbness or weakness of the face,  arm or leg -signs and symptoms of liver injury like dark yellow or brown urine; general ill feeling or flu-like symptoms; light-colored stools; loss of appetite; nausea; right upper belly pain; unusually weak or tired; yellowing of the eyes or skin -signs and symptoms of a stroke like changes in vision; confusion; trouble speaking or understanding; severe headaches; sudden numbness or weakness of the face, arm or leg; trouble walking; dizziness; loss of balance or coordination -sweating -vomiting Side effects that usually do not require medical attention (report to your doctor or health care professional if they continue or are bothersome): -constipation -cough -diarrhea -tiredness This list may not describe all possible side effects. Call your doctor for medical advice about side effects. You may report side effects to FDA at 1-800-FDA-1088. Where should I keep my medicine? Keep out of the reach of children. Store at room temperature between 15 and 30 degrees C (59 and 86 degrees F). Throw away any unused medicine after the expiration date. NOTE: This sheet is a summary. It may not cover all possible information. If you have questions about this medicine, talk to your doctor, pharmacist, or health care provider.  2015, Elsevier/Gold Standard. (2014-02-03 18:30:01) Dexamethasone tablets What is this medicine? DEXAMETHASONE (dex a METH a sone) is a corticosteroid. It is commonly used to treat inflammation of the skin, joints, lungs, and other organs. Common conditions treated include asthma, allergies, and arthritis. It is also used for other conditions, such as blood disorders and diseases of the adrenal  glands. This medicine may be used for other purposes; ask your health care provider or pharmacist if you have questions. COMMON BRAND NAME(S): Decadron, DexPak [place] Big, DexPak TaperPak, Zema-Pak What should I tell my health care provider before I take this medicine? They need to know if you have any of these conditions: -Cushing's syndrome -diabetes -glaucoma -heart problems or disease -high blood pressure -infection like herpes, measles, tuberculosis, or chickenpox -kidney disease -liver disease -mental problems -myasthenia gravis -osteoporosis -previous heart attack -seizures -stomach, ulcer or intestine disease including colitis and diverticulitis -thyroid problem -an unusual or allergic reaction to dexamethasone, corticosteroids, other medicines, lactose, foods, dyes, or preservatives -pregnant or trying to get pregnant -breast-feeding How should I use this medicine? Take this medicine by mouth with a drink of water. Follow the directions on the prescription label. Take it with food or milk to avoid stomach upset. If you are taking this medicine once a day, take it in the morning. Do not take more medicine than you are told to take. Do not suddenly stop taking your medicine because you may develop a severe reaction. Your doctor will tell you how much medicine to take. If your doctor wants you to stop the medicine, the dose may be slowly lowered over time to avoid any side effects. Talk to your pediatrician regarding the use of this medicine in children. Special care may be needed. Patients over 32 years old may have a stronger reaction and need a smaller dose. Overdosage: If you think you have taken too much of this medicine contact a poison control center or emergency room at once. NOTE: This medicine is only for you. Do not share this medicine with others. What if I miss a dose? If you miss a dose, take it as soon as you can. If it is almost time for your next dose, talk to  your doctor or health care professional. You may need to miss a dose or take an extra dose. Do not take  double or extra doses without advice. What may interact with this medicine? Do not take this medicine with any of the following medications: -mifepristone, RU-486 -vaccines This medicine may also interact with the following medications: -amphotericin B -antibiotics like clarithromycin, erythromycin, and troleandomycin -aspirin and aspirin-like drugs -barbiturates like phenobarbital -carbamazepine -cholestyramine -cholinesterase inhibitors like donepezil, galantamine, rivastigmine, and tacrine -cyclosporine -digoxin -diuretics -ephedrine -female hormones, like estrogens or progestins and birth control pills -indinavir -isoniazid -ketoconazole -medicines for diabetes -medicines that improve muscle tone or strength for conditions like myasthenia gravis -NSAIDs, medicines for pain and inflammation, like ibuprofen or naproxen -phenytoin -rifampin -thalidomide -warfarin This list may not describe all possible interactions. Give your health care provider a list of all the medicines, herbs, non-prescription drugs, or dietary supplements you use. Also tell them if you smoke, drink alcohol, or use illegal drugs. Some items may interact with your medicine. What should I watch for while using this medicine? Visit your doctor or health care professional for regular checks on your progress. If you are taking this medicine over a prolonged period, carry an identification card with your name and address, the type and dose of your medicine, and your doctor's name and address. This medicine may increase your risk of getting an infection. Stay away from people who are sick. Tell your doctor or health care professional if you are around anyone with measles or chickenpox. If you are going to have surgery, tell your doctor or health care professional that you have taken this medicine within the last  twelve months. Ask your doctor or health care professional about your diet. You may need to lower the amount of salt you eat. The medicine can increase your blood sugar. If you are a diabetic check with your doctor if you need help adjusting the dose of your diabetic medicine. What side effects may I notice from receiving this medicine? Side effects that you should report to your doctor or health care professional as soon as possible: -allergic reactions like skin rash, itching or hives, swelling of the face, lips, or tongue -changes in vision -fever, sore throat, sneezing, cough, or other signs of infection, wounds that will not heal -increased thirst -mental depression, mood swings, mistaken feelings of self importance or of being mistreated -pain in hips, back, ribs, arms, shoulders, or legs -redness, blistering, peeling or loosening of the skin, including inside the mouth -trouble passing urine or change in the amount of urine -swelling of feet or lower legs -unusual bleeding or bruising Side effects that usually do not require medical attention (report to your doctor or health care professional if they continue or are bothersome): -headache -nausea, vomiting -skin problems, acne, thin and shiny skin -weight gain This list may not describe all possible side effects. Call your doctor for medical advice about side effects. You may report side effects to FDA at 1-800-FDA-1088. Where should I keep my medicine? Keep out of the reach of children. Store at room temperature between 20 and 25 degrees C (68 and 77 degrees F). Protect from light. Throw away any unused medicine after the expiration date. NOTE: This sheet is a summary. It may not cover all possible information. If you have questions about this medicine, talk to your doctor, pharmacist, or health care provider.  2015, Elsevier/Gold Standard. (2008-02-20 14:02:13) Ondansetron tablets What is this medicine? ONDANSETRON (on DAN se  tron) is used to treat nausea and vomiting caused by chemotherapy. It is also used to prevent or treat nausea and vomiting after  surgery. This medicine may be used for other purposes; ask your health care provider or pharmacist if you have questions. COMMON BRAND NAME(S): Zofran What should I tell my health care provider before I take this medicine? They need to know if you have any of these conditions: -heart disease -history of irregular heartbeat -liver disease -low levels of magnesium or potassium in the blood -an unusual or allergic reaction to ondansetron, granisetron, other medicines, foods, dyes, or preservatives -pregnant or trying to get pregnant -breast-feeding How should I use this medicine? Take this medicine by mouth with a glass of water. Follow the directions on your prescription label. Take your doses at regular intervals. Do not take your medicine more often than directed. Talk to your pediatrician regarding the use of this medicine in children. Special care may be needed. Overdosage: If you think you have taken too much of this medicine contact a poison control center or emergency room at once. NOTE: This medicine is only for you. Do not share this medicine with others. What if I miss a dose? If you miss a dose, take it as soon as you can. If it is almost time for your next dose, take only that dose. Do not take double or extra doses. What may interact with this medicine? Do not take this medicine with any of the following medications: -apomorphine -certain medicines for fungal infections like fluconazole, itraconazole, ketoconazole, posaconazole, voriconazole -cisapride -dofetilide -dronedarone -pimozide -thioridazine -ziprasidone This medicine may also interact with the following medications: -carbamazepine -certain medicines for depression, anxiety, or psychotic disturbances -fentanyl -linezolid -MAOIs like Carbex, Eldepryl, Marplan, Nardil, and  Parnate -methylene blue (injected into a vein) -other medicines that prolong the QT interval (cause an abnormal heart rhythm) -phenytoin -rifampicin -tramadol This list may not describe all possible interactions. Give your health care provider a list of all the medicines, herbs, non-prescription drugs, or dietary supplements you use. Also tell them if you smoke, drink alcohol, or use illegal drugs. Some items may interact with your medicine. What should I watch for while using this medicine? Check with your doctor or health care professional right away if you have any sign of an allergic reaction. What side effects may I notice from receiving this medicine? Side effects that you should report to your doctor or health care professional as soon as possible: -allergic reactions like skin rash, itching or hives, swelling of the face, lips or tongue -breathing problems -confusion -dizziness -fast or irregular heartbeat -feeling faint or lightheaded, falls -fever and chills -loss of balance or coordination -seizures -sweating -swelling of the hands or feet -tightness in the chest -tremors -unusually weak or tired Side effects that usually do not require medical attention (report to your doctor or health care professional if they continue or are bothersome): -constipation or diarrhea -headache This list may not describe all possible side effects. Call your doctor for medical advice about side effects. You may report side effects to FDA at 1-800-FDA-1088. Where should I keep my medicine? Keep out of the reach of children. Store between 2 and 30 degrees C (36 and 86 degrees F). Throw away any unused medicine after the expiration date. NOTE: This sheet is a summary. It may not cover all possible information. If you have questions about this medicine, talk to your doctor, pharmacist, or health care provider.  2015, Elsevier/Gold Standard. (2013-08-06 16:27:45)

## 2015-02-22 NOTE — Telephone Encounter (Signed)
PC TO [facility] TO OBTAIN CONTACT INFO FOR DIPLOMAT RX. CONTACT IS EMILY [phone]) [phone]) ASSIST D.O.N IS REQUESTING AN ORDER ALONG W/LIST OF SIDE EFFECTS.

## 2015-02-23 ENCOUNTER — Encounter (HOSPITAL_COMMUNITY): Payer: Self-pay | Admitting: *Deleted

## 2015-02-26 ENCOUNTER — Encounter (HOSPITAL_BASED_OUTPATIENT_CLINIC_OR_DEPARTMENT_OTHER): Payer: Medicare Other

## 2015-02-26 ENCOUNTER — Telehealth (HOSPITAL_COMMUNITY): Payer: Self-pay | Admitting: *Deleted

## 2015-02-26 ENCOUNTER — Encounter (HOSPITAL_COMMUNITY): Payer: Self-pay | Admitting: Hematology & Oncology

## 2015-02-26 ENCOUNTER — Encounter (HOSPITAL_BASED_OUTPATIENT_CLINIC_OR_DEPARTMENT_OTHER): Payer: Medicare Other | Admitting: Hematology & Oncology

## 2015-02-26 VITALS — BP 163/76 | HR 79 | Temp 98.4°F | Resp 16 | Wt 123.9 lb

## 2015-02-26 DIAGNOSIS — C9000 Multiple myeloma not having achieved remission: Secondary | ICD-10-CM

## 2015-02-26 DIAGNOSIS — F0390 Unspecified dementia without behavioral disturbance: Secondary | ICD-10-CM

## 2015-02-26 DIAGNOSIS — D649 Anemia, unspecified: Secondary | ICD-10-CM | POA: Diagnosis not present

## 2015-02-26 DIAGNOSIS — D630 Anemia in neoplastic disease: Secondary | ICD-10-CM

## 2015-02-26 LAB — COMPREHENSIVE METABOLIC PANEL
ALT: 11 U/L (ref 0–35)
AST: 16 U/L (ref 0–37)
Albumin: 2.4 g/dL — ABNORMAL LOW (ref 3.5–5.2)
Alkaline Phosphatase: 45 U/L (ref 39–117)
Anion gap: 1 — ABNORMAL LOW (ref 5–15)
BILIRUBIN TOTAL: 0.4 mg/dL (ref 0.3–1.2)
BUN: 34 mg/dL — ABNORMAL HIGH (ref 6–23)
CO2: 26 mmol/L (ref 19–32)
Calcium: 7.7 mg/dL — ABNORMAL LOW (ref 8.4–10.5)
Chloride: 99 mmol/L (ref 96–112)
Creatinine, Ser: 1.66 mg/dL — ABNORMAL HIGH (ref 0.50–1.10)
GFR calc Af Amer: 32 mL/min — ABNORMAL LOW (ref >90)
GFR, EST NON AFRICAN AMERICAN: 28 mL/min — AB (ref >90)
Glucose, Bld: 130 mg/dL — ABNORMAL HIGH (ref 70–99)
Potassium: 3.7 mmol/L (ref 3.5–5.1)
Sodium: 126 mmol/L — ABNORMAL LOW (ref 135–145)
Total Protein: >12 g/dL — ABNORMAL HIGH (ref 6.0–8.3)

## 2015-02-26 LAB — CBC WITH DIFFERENTIAL/PLATELET
BASOS ABS: 0 10*3/uL (ref 0.0–0.1)
BASOS PCT: 0 % (ref 0–1)
Eosinophils Absolute: 0.1 10*3/uL (ref 0.0–0.7)
Eosinophils Relative: 1 % (ref 0–5)
HCT: 21 % — ABNORMAL LOW (ref 36.0–46.0)
Hemoglobin: 7.3 g/dL — ABNORMAL LOW (ref 12.0–15.0)
Lymphocytes Relative: 28 % (ref 12–46)
Lymphs Abs: 1.8 10*3/uL (ref 0.7–4.0)
MCH: 32.6 pg (ref 26.0–34.0)
MCHC: 34.8 g/dL (ref 30.0–36.0)
MCV: 93.8 fL (ref 78.0–100.0)
MONO ABS: 0.4 10*3/uL (ref 0.1–1.0)
Monocytes Relative: 7 % (ref 3–12)
NEUTROS PCT: 64 % (ref 43–77)
Neutro Abs: 4 10*3/uL (ref 1.7–7.7)
PLATELETS: 85 10*3/uL — AB (ref 150–400)
RBC: 2.24 MIL/uL — AB (ref 3.87–5.11)
RDW: 14.3 % (ref 11.5–15.5)
Smear Review: DECREASED
WBC: 6.3 10*3/uL (ref 4.0–10.5)

## 2015-02-26 NOTE — Patient Instructions (Signed)
[place] at [facility] Discharge Instructions  RECOMMENDATIONS MADE BY THE CONSULTANT AND ANY TEST RESULTS WILL BE SENT TO YOUR REFERRING PHYSICIAN.  Labs drawn today  Return in 2 weeks for labs and to see Tom  03/12/15 @ 11:10    Thank you for choosing [place] at [facility] to provide your oncology and hematology care.  To afford each patient quality time with our provider, please arrive at least 15 minutes before your scheduled appointment time.    You need to re-schedule your appointment should you arrive 10 or more minutes late.  We strive to give you quality time with our providers, and arriving late affects you and other patients whose appointments are after yours.  Also, if you no show three or more times for appointments you may be dismissed from the clinic at the providers discretion.     Again, thank you for choosing [facility].  Our hope is that these requests will decrease the amount of time that you wait before being seen by our physicians.       _____________________________________________________________  Should you have questions after your visit to [facility], please contact our office at [phone] between the hours of 8:30 a.m. and 4:30 p.m.  Voicemails left after 4:30 p.m. will not be returned until the following business day.  For prescription refill requests, have your pharmacy contact our office.

## 2015-02-26 NOTE — Progress Notes (Signed)
Janet Caprice, [name] [address] [place] [address]    DIAGNOSIS:  Anemia IgG 10,600 mg/dl with total protein 13.4 g/dl 8.68 g/dl monoclonal protein IgG kappa Beta 2 microglobulin at 8.1 mg/L BMBX on 02/08/2015  CURRENT THERAPY: Evalutation  INTERVAL HISTORY: [name] 81 y.o. female returns for a bone marrow biopsy and aspirate. She based upon laboratory studies has myeloma. She also has dementia, her daughter is very involved in her care, but has difficulties because of her work schedule giving the patient back and forth to the clinic on a regular basis. They are here today to review the results of her BMBX and discuss potential treament options.  MEDICAL HISTORY: Past Medical History  Diagnosis Date  . Chronic leg pain   . Poor historian   . Psychosis   . Schizoaffective disorder   . Hypertension   . High cholesterol   . Dementia   . Thrombocytopenia 11/10/2014  . Normocytic anemia 11/10/2014  . Elevated total protein 11/10/2014  . Multiple myeloma 02/17/2015    has HEMORRHOIDS, INTERNAL; GERD; OTHER DYSPHAGIA; CERVICAL MUSCLE STRAIN; COLONIC POLYPS, ADENOMATOUS, HX OF; Dementia without behavioral disturbance; Altered mental status; Essential hypertension; Routine gynecological examination; Cervicitis; Vaginal bleeding; Schizoaffective disorder, unspecified type; Vagina bleeding; Anemia; Normocytic anemia; Thrombocytopenia; Elevated total protein; Constipation; and Multiple myeloma on her problem list.     has No Known Allergies.  [name] does not currently have medications on file.  SURGICAL HISTORY: Past Surgical History  Procedure Laterality Date  . Tubal ligation    . Foot surgery      SOCIAL HISTORY: History   Social History  . Marital Status: Widowed    Spouse Name: N/A  . Number of Children: N/A  . Years of Education: N/A   Occupational History  . Not on file.   Social History Main Topics  . Smoking status: Never Smoker    . Smokeless tobacco: Not on file  . Alcohol Use: No  . Drug Use: No  . Sexual Activity: Not Currently    Birth Control/ Protection: Post-menopausal   Other Topics Concern  . Not on file   Social History Narrative    FAMILY HISTORY: Family History  Problem Relation Age of Onset  . Diabetes Mother     Review of Systems  Constitutional: Positive for malaise/fatigue.  HENT: Negative.   Eyes: Negative.   Respiratory: Positive for shortness of breath.   Cardiovascular: Negative.   Gastrointestinal: Negative.   Genitourinary: Positive for frequency.  Musculoskeletal: Negative.   Skin: Negative.   Neurological: Negative.   Endo/Heme/Allergies: Negative.   Psychiatric/Behavioral: Positive for memory loss. The patient is nervous/anxious.     PHYSICAL EXAMINATION  ECOG PERFORMANCE STATUS: 1 - Symptomatic but completely ambulatory  There were no vitals filed for this visit.  Physical Exam  Constitutional: She is oriented to person, place, and time and well-developed, well-nourished, and in no distress.  HENT:  Head: Normocephalic and atraumatic.  Nose: Nose normal.  Mouth/Throat: Oropharynx is clear and moist. No oropharyngeal exudate.  Eyes: Conjunctivae and EOM are normal. Pupils are equal, round, and reactive to light. Right eye exhibits no discharge. Left eye exhibits no discharge. No scleral icterus.  Neck: Normal range of motion. Neck supple. No tracheal deviation present. No thyromegaly present.  Cardiovascular: Normal rate, regular rhythm and normal heart sounds.  Exam reveals no gallop and no friction rub.   No murmur heard. Pulmonary/Chest: Effort normal and breath sounds normal.  She has no wheezes. She has no rales.  Abdominal: Soft. Bowel sounds are normal. She exhibits no distension and no mass. There is no tenderness. There is no rebound and no guarding.  Musculoskeletal: Normal range of motion. She exhibits no edema.  Lymphadenopathy:    She has no cervical  adenopathy.  Neurological: She is alert and oriented to person, place, and time. She has normal reflexes. No cranial nerve deficit. Gait normal. Coordination normal.  Skin: Skin is warm and dry. No rash noted.  Psychiatric: Mood, memory, affect and judgment normal.  Nursing note and vitals reviewed.   LABORATORY DATA: Results for KAICEE, SCARPINO (MRN [id number])   Ref. Range 02/26/2015 12:34  Sodium Latest Ref Range: 135-145 mmol/L 126 (L)  Potassium Latest Ref Range: 3.5-5.1 mmol/L 3.7  Chloride Latest Ref Range: 96-112 mmol/L 99  CO2 Latest Ref Range: 19-32 mmol/L 26  BUN Latest Ref Range: 6-23 mg/dL 34 (H)  Creatinine Latest Ref Range: 0.50-1.10 mg/dL 1.66 (H)  Calcium Latest Ref Range: 8.4-10.5 mg/dL 7.7 (L)  EGFR (Non-African Amer.) Latest Ref Range: >90 mL/min 28 (L)  EGFR (African American) Latest Ref Range: >90 mL/min 32 (L)  Glucose Latest Ref Range: 70-99 mg/dL 130 (H)  Anion gap Latest Ref Range: 5-15  1 (L)  Alkaline Phosphatase Latest Ref Range: 39-117 U/L 45  Albumin Latest Ref Range: 3.5-5.2 g/dL 2.4 (L)  AST Latest Ref Range: 0-37 U/L 16  ALT Latest Ref Range: 0-35 U/L 11  Total Protein Latest Ref Range: 6.0-8.3 g/dL >12.0 (H)  Total Bilirubin Latest Ref Range: 0.3-1.2 mg/dL 0.4  WBC Latest Ref Range: 4.0-10.5 K/uL 6.3  RBC Latest Ref Range: 3.87-5.11 MIL/uL 2.24 (L)  Hemoglobin Latest Ref Range: 12.0-15.0 g/dL 7.3 (L)  HCT Latest Ref Range: 36.0-46.0 % 21.0 (L)  MCV Latest Ref Range: 78.0-100.0 fL 93.8  MCH Latest Ref Range: 26.0-34.0 pg 32.6  MCHC Latest Ref Range: 30.0-36.0 g/dL 34.8  RDW Latest Ref Range: 11.5-15.5 % 14.3  Platelets Latest Ref Range: 150-400 K/uL 85 (L)  Neutrophils Latest Ref Range: 43-77 % 64  Lymphocytes Latest Ref Range: 12-46 % 28  Monocytes Relative Latest Ref Range: 3-12 % 7  Eosinophil Latest Ref Range: 0-5 % 1  Basophil Latest Ref Range: 0-1 % 0  NEUT# Latest Ref Range: 1.7-7.7 K/uL 4.0  Lymphocyte # Latest Ref Range: 0.7-4.0 K/uL  1.8  Monocyte # Latest Ref Range: 0.1-1.0 K/uL 0.4  Eosinophils Absolute Latest Ref Range: 0.0-0.7 K/uL 0.1  Basophils Absolute Latest Ref Range: 0.0-0.1 K/uL 0.0  Smear Review Unknown PLATELETS APPEAR ...    BMBX on 02/08/2015 58% plasma cells, kappa restricted BONE MARROW ASPIRATE: Paucispicular, but cellular. Erythroid precursors: Overall reduced. No significant dysplasia. Granulocytic precursors: Overall reduced. No significant dysplasia. No increase in blasts. Megakaryocytes: Present and morphologically unremarkable. Lymphocytes/plasma cells: There is a marked increase in plasma cells (58%) with atypical forms including large forms and prominent nucleoli. Lymphocytes are not increased. TOUCH PREPARATIONS: Similar to aspirate smears. CLOT and BIOPSY: The core biopsy is small, but hyper cellular for age (80%). There is a marked increase in plasma cells, including large clusters. There is admixed trilineage hematopoiesis. There are no atypical lymphoid aggregates. The clot section does not contain marrow tissue. IRON STAIN: Iron stains are performed on a bone marrow aspirate smear and section of clot. The controls stained appropriately. There are no particles on the aspirate or clot section for evaluation. Storage Iron: N/A. Ringed Sideroblasts: N/A. ADDITIONAL DATA / TESTING: Cytogenetics was  ordered, including FISH. Per records the patient has a M-spike with IgG kappa (8.68 g/dL).    ASSESSMENT and THERAPY PLAN:   Multiple myeloma, IgG kappa Profound anemia Dementia  Patient tolerated the bone marrow biopsy without difficulty. We reviewed the results of her BMBX today which confirm that the patient has Myeloma. We will be able to obtain fish results; I currently [name] not have them. In regards to therapy her daughter and I have briefly discussed oral options as that will be the best for the patient and family in regards to transportation. Transportation is a major issue for them.  Her mother's dementia makes her completely dependent on family.  Anemia  Hemoglobin is 7.3  today. The patient is asymptomatic and vitals are stable. We will continue to monitor her CBC closely.   Plan will be to initiate Zometa, Revlimid and Dex. We will arrange for teaching.  She will also need to be on an adult aspirin daily or 1 mg coumadin.  All questions were answered. The patient knows to call the clinic with any problems, questions or concerns. We can certainly see the patient much sooner if necessary. This note was signed electronically  Molli Hazard MD 02/26/2015

## 2015-02-26 NOTE — Progress Notes (Signed)
Labs drawn

## 2015-02-26 NOTE — Telephone Encounter (Signed)
Patient is supposed to get her Revlimid on Monday. I can't get through to [facility]. The receptionist puts me through to the nurse that is taking care of her but she will not answer the phone. Nurse @ [facility] will have to be notified on Monday regarding this.

## 2015-03-01 ENCOUNTER — Encounter (HOSPITAL_COMMUNITY): Payer: Medicare Other

## 2015-03-01 ENCOUNTER — Telehealth (HOSPITAL_COMMUNITY): Payer: Self-pay | Admitting: *Deleted

## 2015-03-01 ENCOUNTER — Other Ambulatory Visit (HOSPITAL_COMMUNITY): Payer: Self-pay | Admitting: Oncology

## 2015-03-01 DIAGNOSIS — D649 Anemia, unspecified: Secondary | ICD-10-CM | POA: Diagnosis not present

## 2015-03-01 DIAGNOSIS — C9000 Multiple myeloma not having achieved remission: Secondary | ICD-10-CM

## 2015-03-01 LAB — PREPARE RBC (CROSSMATCH)

## 2015-03-01 NOTE — Telephone Encounter (Signed)
Message left on patient's daughter's phone regarding the danger of taking Revlimid and getting pregnant and the fact that the baby could have severe birth defects such as no limbs (arms/legs). I also spoke with [name], pt's nurse @ [facility], to let her know that all caregivers needed to use gloves when administering and that if anyone other than [name] took this medication that there unborn baby could have severe birth defects. [name] said she would mark this down in patient's 24 hour notes. [employer] authorization obtained. Josem Kaufmann # [id number])

## 2015-03-02 ENCOUNTER — Encounter (HOSPITAL_BASED_OUTPATIENT_CLINIC_OR_DEPARTMENT_OTHER): Payer: Medicare Other

## 2015-03-02 ENCOUNTER — Encounter: Payer: Self-pay | Admitting: *Deleted

## 2015-03-02 ENCOUNTER — Encounter (HOSPITAL_COMMUNITY): Payer: Self-pay

## 2015-03-02 VITALS — BP 163/55 | HR 87 | Temp 98.2°F | Resp 20

## 2015-03-02 DIAGNOSIS — C9000 Multiple myeloma not having achieved remission: Secondary | ICD-10-CM

## 2015-03-02 DIAGNOSIS — D649 Anemia, unspecified: Secondary | ICD-10-CM

## 2015-03-02 MED ORDER — DIPHENHYDRAMINE HCL 25 MG PO CAPS
25.0000 mg | ORAL_CAPSULE | Freq: Once | ORAL | Status: AC
  Administered 2015-03-02: 25 mg via ORAL
  Filled 2015-03-02: qty 1

## 2015-03-02 MED ORDER — ACETAMINOPHEN 325 MG PO TABS
650.0000 mg | ORAL_TABLET | Freq: Once | ORAL | Status: AC
  Administered 2015-03-02: 650 mg via ORAL
  Filled 2015-03-02: qty 2

## 2015-03-02 MED ORDER — SODIUM CHLORIDE 0.9 % IV SOLN
250.0000 mL | Freq: Once | INTRAVENOUS | Status: AC
Start: 2015-03-02 — End: 2015-03-02
  Administered 2015-03-02: 250 mL via INTRAVENOUS

## 2015-03-02 MED ORDER — SODIUM CHLORIDE 0.9 % IJ SOLN: 10.0000 mL | INTRAMUSCULAR | Status: DC | PRN

## 2015-03-02 MED ORDER — SODIUM CHLORIDE 0.9 % IV SOLN
INTRAVENOUS | Status: DC
  Administered 2015-03-02: 14:00:00 via INTRAVENOUS

## 2015-03-02 NOTE — Progress Notes (Signed)
[place] Clinical Social Work  Clinical Social Work was referred by [place] rounding. Pt introduced self to patient and granddaughter. CSW reviewed role of CSW and ways to assist. Daughter then came out of restroom, whom CSW has met before. Daughter shared frustrations with pt's ALF due to transportation concerns. She reports they will not agree to transport her to [place] county appointments. CSW not aware of additional resources to assist, as pt has dementia and cannot travel alone. Daughter is looking into in county ALF options as well. CSW provided supportive listening and resource education.      Clinical Social Work interventions: CSW provided supportive listening and resource education.     Loren Racer, [place] Tuesdays 8:30-1pm Wednesdays 8:30-12pm  [phone]

## 2015-03-02 NOTE — Patient Instructions (Signed)
[place] at [facility] Discharge Instructions  RECOMMENDATIONS MADE BY THE CONSULTANT AND ANY TEST RESULTS WILL BE SENT TO YOUR REFERRING PHYSICIAN.  You received 2 units of blood today. Call for any questions. We will see you at your next appointment.  Thank you for choosing [place] at [facility] to provide your oncology and hematology care.  To afford each patient quality time with our provider, please arrive at least 15 minutes before your scheduled appointment time.    You need to re-schedule your appointment should you arrive 10 or more minutes late.  We strive to give you quality time with our providers, and arriving late affects you and other patients whose appointments are after yours.  Also, if you no show three or more times for appointments you may be dismissed from the clinic at the providers discretion.     Again, thank you for choosing [facility].  Our hope is that these requests will decrease the amount of time that you wait before being seen by our physicians.       _____________________________________________________________  Should you have questions after your visit to [facility], please contact our office at [phone] between the hours of 8:30 a.m. and 4:30 p.m.  Voicemails left after 4:30 p.m. will not be returned until the following business day.  For prescription refill requests, have your pharmacy contact our office.

## 2015-03-02 NOTE — Progress Notes (Signed)
Patient tolerated blood transfusion well 

## 2015-03-03 LAB — TYPE AND SCREEN
ABO/RH(D): O POS
Antibody Screen: NEGATIVE
UNIT DIVISION: 0
Unit division: 0

## 2015-03-04 LAB — TISSUE HYBRIDIZATION (BONE MARROW)-NCBH

## 2015-03-09 ENCOUNTER — Other Ambulatory Visit (HOSPITAL_COMMUNITY): Payer: Self-pay

## 2015-03-12 ENCOUNTER — Encounter (HOSPITAL_BASED_OUTPATIENT_CLINIC_OR_DEPARTMENT_OTHER): Payer: Medicare Other

## 2015-03-12 ENCOUNTER — Encounter (HOSPITAL_BASED_OUTPATIENT_CLINIC_OR_DEPARTMENT_OTHER): Payer: Medicare Other | Admitting: Oncology

## 2015-03-12 VITALS — BP 171/67 | HR 75 | Temp 98.8°F | Resp 18 | Wt 125.6 lb

## 2015-03-12 DIAGNOSIS — C9000 Multiple myeloma not having achieved remission: Secondary | ICD-10-CM

## 2015-03-12 DIAGNOSIS — D630 Anemia in neoplastic disease: Secondary | ICD-10-CM

## 2015-03-12 DIAGNOSIS — D649 Anemia, unspecified: Secondary | ICD-10-CM | POA: Diagnosis not present

## 2015-03-12 LAB — CBC WITH DIFFERENTIAL/PLATELET
BASOS ABS: 0 10*3/uL (ref 0.0–0.1)
Basophils Relative: 0 % (ref 0–1)
EOS ABS: 0.1 10*3/uL (ref 0.0–0.7)
Eosinophils Relative: 1 % (ref 0–5)
HCT: 26.3 % — ABNORMAL LOW (ref 36.0–46.0)
Hemoglobin: 8.8 g/dL — ABNORMAL LOW (ref 12.0–15.0)
LYMPHS PCT: 14 % (ref 12–46)
Lymphs Abs: 1.7 10*3/uL (ref 0.7–4.0)
MCH: 31.5 pg (ref 26.0–34.0)
MCHC: 33.5 g/dL (ref 30.0–36.0)
MCV: 94.3 fL (ref 78.0–100.0)
Monocytes Absolute: 1.1 10*3/uL — ABNORMAL HIGH (ref 0.1–1.0)
Monocytes Relative: 9 % (ref 3–12)
NEUTROS ABS: 9 10*3/uL — AB (ref 1.7–7.7)
Neutrophils Relative %: 76 % (ref 43–77)
PLATELETS: 100 10*3/uL — AB (ref 150–400)
RBC: 2.79 MIL/uL — AB (ref 3.87–5.11)
RDW: 13.8 % (ref 11.5–15.5)
WBC: 11.9 10*3/uL — ABNORMAL HIGH (ref 4.0–10.5)

## 2015-03-12 LAB — COMPREHENSIVE METABOLIC PANEL
ALT: 17 U/L (ref 0–35)
AST: 14 U/L (ref 0–37)
Albumin: 2.6 g/dL — ABNORMAL LOW (ref 3.5–5.2)
Alkaline Phosphatase: 57 U/L (ref 39–117)
Anion gap: 3 — ABNORMAL LOW (ref 5–15)
BUN: 60 mg/dL — AB (ref 6–23)
CO2: 25 mmol/L (ref 19–32)
CREATININE: 1.44 mg/dL — AB (ref 0.50–1.10)
Calcium: 8.2 mg/dL — ABNORMAL LOW (ref 8.4–10.5)
Chloride: 104 mmol/L (ref 96–112)
GFR, EST AFRICAN AMERICAN: 38 mL/min — AB (ref >90)
GFR, EST NON AFRICAN AMERICAN: 33 mL/min — AB (ref >90)
Glucose, Bld: 110 mg/dL — ABNORMAL HIGH (ref 70–99)
Potassium: 3.2 mmol/L — ABNORMAL LOW (ref 3.5–5.1)
SODIUM: 132 mmol/L — AB (ref 135–145)
Total Bilirubin: 0.5 mg/dL (ref 0.3–1.2)

## 2015-03-12 MED ORDER — CALCIUM CARBONATE-VITAMIN D 500-200 MG-UNIT PO TABS: 1.0000 | ORAL_TABLET | Freq: Two times a day (BID) | ORAL | Status: DC

## 2015-03-12 NOTE — Assessment & Plan Note (Addendum)
Multiple myeloma with anemia and bone marrow aspiration and biopsy on 02/08/2015 demonstrating a marked increase in plasma cells (58% by aspirate), consistent with plasma cell myeloma.  Started Revlimid 10 mg days 1-21 every 28 days and Dexamethasone 40 mg weekly.  Oncology history updated.   Discussed Zometa therapy.  We will start that in 2 weeks.  Labs in 2 weeks: CBC diff, CMET, ESR, CRP, LDH, MM panel, B2M.  Hgb is improved at 8.8 g/dL.  I have offered additional blood transfusion to get Hgb > 9 g/dL.  However, due to transportation issues, the family would prefer to observe Hgb for now.  We will recheck and draw a pink top in 2 weeks in case she needs blood.  Patient is asymptomatic, but demented.  Return in 2 weeks for follow-up, Zometa, and labs.

## 2015-03-12 NOTE — Progress Notes (Signed)
[name], [name] [address] [place] [address]  Multiple myeloma - Plan: CBC with Differential, Comprehensive metabolic panel, Lactate dehydrogenase, Sedimentation rate, C-reactive protein, Beta 2 microglobuline, serum, Multiple myeloma panel, serum, Kappa/lambda light chains  Hypocalcemia - Plan: calcium-vitamin D (OSCAL WITH D) 500-200 MG-UNIT per tablet  CURRENT THERAPY: Revlimid 10 mg days 1-21 every 28 days and Dexamethasone 40 mg weekly.  INTERVAL HISTORY: [name] 81 y.o. female returns for followup of multiple myeloma.    Multiple myeloma   02/08/2015 Bone Marrow Biopsy The bone marrow exhibits a marked increase in plasma cells (58% by aspirate), consistent with plasma cell myeloma.   03/05/2015 -  Chemotherapy Revlimid 10 mg days 1-21 every 28 days and 40 mg of Dexamethasone weekly.    I personally reviewed and went over laboratory results with the patient.  The results are noted within this dictation.  Her Hgb is improved.  [name] reports that she feels well and improved compared to past transfusion.  She and the family decline another transfusion at this time and we will see what it is in 2 weeks or so.  I spent time with the patient and family discussing Zometa treatment.  It is indicated given her diagnosis.  We discussed the risks (hypocalcemia and ONJ), benefits, alternatives, and side effects of the medication.  I discussed its role in the treatment of MM.  They are in agreement with the addition of this medication.  We will plan to administer with next visit in 2 weeks.  Patient denies any complaints and ROS questioning is negative.   Past Medical History  Diagnosis Date  . Chronic leg pain   . Poor historian   . Psychosis   . Schizoaffective disorder   . Hypertension   . High cholesterol   . Dementia   . Thrombocytopenia 11/10/2014  . Normocytic anemia 11/10/2014  . Elevated total protein 11/10/2014  . Multiple myeloma  02/17/2015  . Arthritis     has HEMORRHOIDS, INTERNAL; GERD; OTHER DYSPHAGIA; CERVICAL MUSCLE STRAIN; COLONIC POLYPS, ADENOMATOUS, HX OF; Dementia without behavioral disturbance; Altered mental status; Essential hypertension; Routine gynecological examination; Cervicitis; Vaginal bleeding; Schizoaffective disorder, unspecified type; Vagina bleeding; Anemia; Normocytic anemia; Thrombocytopenia; Elevated total protein; Constipation; and Multiple myeloma on her problem list.     has No Known Allergies.  [name] does not currently have medications on file.  Past Surgical History  Procedure Laterality Date  . Tubal ligation    . Foot surgery      Denies any headaches, dizziness, double vision, fevers, chills, night sweats, nausea, vomiting, diarrhea, constipation, chest pain, heart palpitations, shortness of breath, blood in stool, black tarry stool, urinary pain, urinary burning, urinary frequency, hematuria.   PHYSICAL EXAMINATION  ECOG PERFORMANCE STATUS: 2 - Symptomatic, <50% confined to bed  Filed Vitals:   03/12/15 1133  BP: 171/67  Pulse: 75  Temp: 98.8 F (37.1 C)  Resp: 18    GENERAL:alert, no distress, well nourished, well developed, comfortable, cooperative, smiling and pleasantly demented, accompanied by daughter and grand-daughter. SKIN: skin color, texture, turgor are normal, no rashes or significant lesions HEAD: Normocephalic, No masses, lesions, tenderness or abnormalities EYES: normal, PERRLA, EOMI, Conjunctiva are pink and non-injected EARS: External ears normal OROPHARYNX:lips, buccal mucosa, and tongue normal and mucous membranes are moist  NECK: supple, no adenopathy, thyroid normal size, non-tender, without nodularity, no stridor, non-tender, trachea midline LYMPH:  no palpable lymphadenopathy BREAST:not examined LUNGS: clear  to auscultation and percussion HEART: regular rate & rhythm, no murmurs, no gallops, S1 normal and S2 normal ABDOMEN:abdomen soft,  non-tender and normal bowel sounds BACK: Back symmetric, no curvature. EXTREMITIES:less then 2 second capillary refill, no joint deformities, effusion, or inflammation, no edema, no skin discoloration, no clubbing, no cyanosis  NEURO: alert & oriented x 3 with fluent speech, no focal motor/sensory deficits, gait normal   LABORATORY DATA: CBC    Component Value Date/Time   WBC 11.9* 03/12/2015 1120   RBC 2.79* 03/12/2015 1120   RBC 2.54* 01/04/2015 1700   HGB 8.8* 03/12/2015 1120   HCT 26.3* 03/12/2015 1120   PLT 100* 03/12/2015 1120   MCV 94.3 03/12/2015 1120   MCH 31.5 03/12/2015 1120   MCHC 33.5 03/12/2015 1120   RDW 13.8 03/12/2015 1120   LYMPHSABS 1.7 03/12/2015 1120   MONOABS 1.1* 03/12/2015 1120   EOSABS 0.1 03/12/2015 1120   BASOSABS 0.0 03/12/2015 1120      Chemistry      Component Value Date/Time   NA 132* 03/12/2015 1120   K 3.2* 03/12/2015 1120   CL 104 03/12/2015 1120   CO2 25 03/12/2015 1120   BUN 60* 03/12/2015 1120   CREATININE 1.44* 03/12/2015 1120      Component Value Date/Time   CALCIUM 8.2* 03/12/2015 1120   ALKPHOS 57 03/12/2015 1120   AST 14 03/12/2015 1120   ALT 17 03/12/2015 1120   BILITOT 0.5 03/12/2015 1120       ASSESSMENT AND PLAN:  Multiple myeloma Multiple myeloma with anemia and bone marrow aspiration and biopsy on 02/08/2015 demonstrating a marked increase in plasma cells (58% by aspirate), consistent with plasma cell myeloma.  Started Revlimid 10 mg days 1-21 every 28 days and Dexamethasone 40 mg weekly.  Oncology history updated.   Discussed Zometa therapy.  We will start that in 2 weeks.  Labs in 2 weeks: CBC diff, CMET, ESR, CRP, LDH, MM panel, B2M.  Hgb is improved at 8.8 g/dL.  I have offered additional blood transfusion to get Hgb > 9 g/dL.  However, due to transportation issues, the family would prefer to observe Hgb for now.  We will recheck and draw a pink top in 2 weeks in case she needs blood.  Patient is  asymptomatic, but demented.  Return in 2 weeks for follow-up, Zometa, and labs.    THERAPY PLAN:  Continue tx as planned.  We will initiate Zometa treatment in 2 weeks with follow-up appointment.  All questions were answered. The patient knows to call the clinic with any problems, questions or concerns. We can certainly see the patient much sooner if necessary.  Patient and plan discussed with Dr. Ancil Linsey and she is in agreement with the aforementioned.   This note is electronically signed by: Robynn Pane 03/12/2015 12:21 PM

## 2015-03-12 NOTE — Patient Instructions (Signed)
[place] at [facility] Discharge Instructions  RECOMMENDATIONS MADE BY THE CONSULTANT AND ANY TEST RESULTS WILL BE SENT TO YOUR REFERRING PHYSICIAN.  Exam and discussion by Robynn Pane, PA-C Continue Revlimid [place]  - take 1 twice daily  Follow-up in 2 weeks with labs, office visit and zometa.    Thank you for choosing [place] at [facility] to provide your oncology and hematology care.  To afford each patient quality time with our provider, please arrive at least 15 minutes before your scheduled appointment time.    You need to re-schedule your appointment should you arrive 10 or more minutes late.  We strive to give you quality time with our providers, and arriving late affects you and other patients whose appointments are after yours.  Also, if you no show three or more times for appointments you may be dismissed from the clinic at the providers discretion.     Again, thank you for choosing [facility].  Our hope is that these requests will decrease the amount of time that you wait before being seen by our physicians.       _____________________________________________________________  Should you have questions after your visit to [facility], please contact our office at [phone] between the hours of 8:30 a.m. and 4:30 p.m.  Voicemails left after 4:30 p.m. will not be returned until the following business day.  For prescription refill requests, have your pharmacy contact our office.    Zoledronic Acid injection (Hypercalcemia, Oncology) What is this medicine? ZOLEDRONIC ACID (ZOE le dron ik AS id) lowers the amount of calcium loss from bone. It is used to treat too much calcium in your blood from cancer. It is also used to prevent complications of cancer that has spread to the bone. This medicine may be used for other purposes; ask your health care provider or pharmacist if you have questions. COMMON  BRAND NAME(S): Zometa What should I tell my health care provider before I take this medicine? They need to know if you have any of these conditions: -aspirin-sensitive asthma -cancer, especially if you are receiving medicines used to treat cancer -dental disease or wear dentures -infection -kidney disease -receiving corticosteroids like dexamethasone or prednisone -an unusual or allergic reaction to zoledronic acid, other medicines, foods, dyes, or preservatives -pregnant or trying to get pregnant -breast-feeding How should I use this medicine? This medicine is for infusion into a vein. It is given by a health care professional in a hospital or clinic setting. Talk to your pediatrician regarding the use of this medicine in children. Special care may be needed. Overdosage: If you think you have taken too much of this medicine contact a poison control center or emergency room at once. NOTE: This medicine is only for you. Do not share this medicine with others. What if I miss a dose? It is important not to miss your dose. Call your doctor or health care professional if you are unable to keep an appointment. What may interact with this medicine? -certain antibiotics given by injection -NSAIDs, medicines for pain and inflammation, like ibuprofen or naproxen -some diuretics like bumetanide, furosemide -teriparatide -thalidomide This list may not describe all possible interactions. Give your health care provider a list of all the medicines, herbs, non-prescription drugs, or dietary supplements you use. Also tell them if you smoke, drink alcohol, or use illegal drugs. Some items may interact with your medicine. What should I watch for  while using this medicine? Visit your doctor or health care professional for regular checkups. It may be some time before you see the benefit from this medicine. Do not stop taking your medicine unless your doctor tells you to. Your doctor may order blood tests or  other tests to see how you are doing. Women should inform their doctor if they wish to become pregnant or think they might be pregnant. There is a potential for serious side effects to an unborn child. Talk to your health care professional or pharmacist for more information. You should make sure that you get enough calcium and vitamin D while you are taking this medicine. Discuss the foods you eat and the vitamins you take with your health care professional. Some people who take this medicine have severe bone, joint, and/or muscle pain. This medicine may also increase your risk for jaw problems or a broken thigh bone. Tell your doctor right away if you have severe pain in your jaw, bones, joints, or muscles. Tell your doctor if you have any pain that does not go away or that gets worse. Tell your dentist and dental surgeon that you are taking this medicine. You should not have major dental surgery while on this medicine. See your dentist to have a dental exam and fix any dental problems before starting this medicine. Take good care of your teeth while on this medicine. Make sure you see your dentist for regular follow-up appointments. What side effects may I notice from receiving this medicine? Side effects that you should report to your doctor or health care professional as soon as possible: -allergic reactions like skin rash, itching or hives, swelling of the face, lips, or tongue -anxiety, confusion, or depression -breathing problems -changes in vision -eye pain -feeling faint or lightheaded, falls -jaw pain, especially after dental work -mouth sores -muscle cramps, stiffness, or weakness -trouble passing urine or change in the amount of urine Side effects that usually do not require medical attention (report to your doctor or health care professional if they continue or are bothersome): -bone, joint, or muscle pain -constipation -diarrhea -fever -hair loss -irritation at site where  injected -loss of appetite -nausea, vomiting -stomach upset -trouble sleeping -trouble swallowing -weak or tired This list may not describe all possible side effects. Call your doctor for medical advice about side effects. You may report side effects to FDA at 1-800-FDA-1088. Where should I keep my medicine? This drug is given in a hospital or clinic and will not be stored at home. NOTE: This sheet is a summary. It may not cover all possible information. If you have questions about this medicine, talk to your doctor, pharmacist, or health care provider.  2015, Elsevier/Gold Standard. (2013-04-10 13:03:13)

## 2015-03-13 LAB — IGG, IGA, IGM
IgA: 18 mg/dL — ABNORMAL LOW (ref 64–422)
IgG (Immunoglobin G), Serum: 8513 mg/dL — ABNORMAL HIGH (ref 700–1600)
IgM, Serum: <6 mg/dL — ABNORMAL LOW (ref 26–217)

## 2015-03-14 LAB — KAPPA/LAMBDA LIGHT CHAINS
Kappa free light chain: 146.01 mg/L — ABNORMAL HIGH (ref 3.30–19.40)
Kappa, lambda light chain ratio: 58.64 — ABNORMAL HIGH (ref 0.26–1.65)
Lambda free light chains: 2.49 mg/L — ABNORMAL LOW (ref 5.71–26.30)

## 2015-03-15 LAB — PROTEIN ELECTROPHORESIS, SERUM
A/G Ratio: 0.5 — ABNORMAL LOW (ref 0.7–2.0)
ALPHA-1-GLOBULIN: 0.2 g/dL (ref 0.1–0.4)
ALPHA-2-GLOBULIN: 0.6 g/dL (ref 0.4–1.2)
Albumin ELP: 4.2 g/dL (ref 3.2–5.6)
Beta Globulin: 0.7 g/dL (ref 0.6–1.3)
GAMMA GLOBULIN: 7.5 g/dL — AB (ref 0.5–1.6)
Globulin, Total: 9.1 g/dL — ABNORMAL HIGH (ref 2.0–4.5)
M-SPIKE, %: 7.4 g/dL — AB
TOTAL PROTEIN ELP: 13.3 g/dL — AB (ref 6.0–8.5)

## 2015-03-15 LAB — IMMUNOFIXATION ELECTROPHORESIS
IGA: 19 mg/dL — AB (ref 64–422)
IgG (Immunoglobin G), Serum: 10211 mg/dL — ABNORMAL HIGH (ref 700–1600)
IgM, Serum: <6 mg/dL — ABNORMAL LOW (ref 26–217)
Total Protein ELP: 13.1 g/dL — ABNORMAL HIGH (ref 6.0–8.5)

## 2015-03-15 NOTE — Progress Notes (Signed)
Labs drawn

## 2015-03-17 ENCOUNTER — Telehealth (HOSPITAL_COMMUNITY): Payer: Self-pay | Admitting: *Deleted

## 2015-03-17 ENCOUNTER — Other Ambulatory Visit (HOSPITAL_COMMUNITY): Payer: Self-pay | Admitting: *Deleted

## 2015-03-17 DIAGNOSIS — C9000 Multiple myeloma not having achieved remission: Secondary | ICD-10-CM

## 2015-03-17 MED ORDER — LENALIDOMIDE 10 MG PO CAPS: 10.0000 mg | ORAL_CAPSULE | Freq: Every day | ORAL | Status: DC

## 2015-03-17 NOTE — Telephone Encounter (Signed)
Received a call from [place], [place] at [facility] - reports called facility where pt resides to verify pt's Revlamid and discovered that patient was on day 14 of 21 day cycle and only had 2 caps left.  States that nurse at [facility] stated that the [facility] had dose of 5mg , 2 caps daily written on it.  Facility unsure if pt has been receiving extra doses of Revlamid or not.  Discussed with Dr. Whitney Muse, reports to have pt finish last 2 caps as prescribed, take 7 days off, then start new cycle.  New Rx signed and sent to [place].  [employer] and explained.  [facility] to verify information.  Pt's nurse verified the information and told her to continue with patient's last 2 doses of Revlamid, take 7 days off and start new cycle.  Verbalized understanding.  Nurse also reports that appropriate dose of Revlamid has also been changed on the [facility] at the facility.  Caregiver denies any symptoms from patient.

## 2015-03-19 ENCOUNTER — Other Ambulatory Visit (HOSPITAL_COMMUNITY): Payer: Self-pay | Admitting: Oncology

## 2015-03-19 DIAGNOSIS — C9000 Multiple myeloma not having achieved remission: Secondary | ICD-10-CM

## 2015-03-19 MED ORDER — LENALIDOMIDE 10 MG PO CAPS: 10.0000 mg | ORAL_CAPSULE | Freq: Every day | ORAL | Status: DC

## 2015-03-24 ENCOUNTER — Encounter (HOSPITAL_COMMUNITY): Payer: Self-pay | Admitting: Hematology & Oncology

## 2015-03-25 ENCOUNTER — Encounter (HOSPITAL_COMMUNITY): Payer: Self-pay | Admitting: Hematology & Oncology

## 2015-03-25 ENCOUNTER — Encounter (HOSPITAL_COMMUNITY): Payer: Medicare Other

## 2015-03-25 ENCOUNTER — Encounter (HOSPITAL_COMMUNITY): Payer: Medicare Other | Attending: Family Medicine | Admitting: Hematology & Oncology

## 2015-03-25 ENCOUNTER — Encounter (HOSPITAL_BASED_OUTPATIENT_CLINIC_OR_DEPARTMENT_OTHER): Payer: Medicare Other

## 2015-03-25 ENCOUNTER — Ambulatory Visit (HOSPITAL_COMMUNITY)
Admission: RE | Admit: 2015-03-25 | Discharge: 2015-03-25 | Disposition: A | Discharge status: Home / Self Care | Payer: Medicare Other | Source: Ambulatory Visit | Attending: Hematology & Oncology | Admitting: Hematology & Oncology

## 2015-03-25 VITALS — BP 125/54 | HR 100 | Temp 98.5°F | Resp 16 | Wt 128.0 lb

## 2015-03-25 DIAGNOSIS — M19012 Primary osteoarthritis, left shoulder: Secondary | ICD-10-CM | POA: Insufficient documentation

## 2015-03-25 DIAGNOSIS — C9000 Multiple myeloma not having achieved remission: Secondary | ICD-10-CM

## 2015-03-25 DIAGNOSIS — M85869 Other specified disorders of bone density and structure, unspecified lower leg: Secondary | ICD-10-CM | POA: Diagnosis not present

## 2015-03-25 DIAGNOSIS — M160 Bilateral primary osteoarthritis of hip: Secondary | ICD-10-CM | POA: Insufficient documentation

## 2015-03-25 DIAGNOSIS — M170 Bilateral primary osteoarthritis of knee: Secondary | ICD-10-CM | POA: Insufficient documentation

## 2015-03-25 DIAGNOSIS — M85829 Other specified disorders of bone density and structure, unspecified upper arm: Secondary | ICD-10-CM | POA: Insufficient documentation

## 2015-03-25 DIAGNOSIS — D649 Anemia, unspecified: Secondary | ICD-10-CM

## 2015-03-25 DIAGNOSIS — M19011 Primary osteoarthritis, right shoulder: Secondary | ICD-10-CM | POA: Diagnosis not present

## 2015-03-25 LAB — CBC WITH DIFFERENTIAL/PLATELET
Basophils Absolute: 0 10*3/uL (ref 0.0–0.1)
Basophils Relative: 1 % (ref 0–1)
EOS ABS: 0.1 10*3/uL (ref 0.0–0.7)
EOS PCT: 2 % (ref 0–5)
HCT: 23.2 % — ABNORMAL LOW (ref 36.0–46.0)
Hemoglobin: 7.8 g/dL — ABNORMAL LOW (ref 12.0–15.0)
LYMPHS ABS: 1.3 10*3/uL (ref 0.7–4.0)
LYMPHS PCT: 33 % (ref 12–46)
MCH: 31.8 pg (ref 26.0–34.0)
MCHC: 33.6 g/dL (ref 30.0–36.0)
MCV: 94.7 fL (ref 78.0–100.0)
MONOS PCT: 10 % (ref 3–12)
Monocytes Absolute: 0.4 10*3/uL (ref 0.1–1.0)
Neutro Abs: 2.1 10*3/uL (ref 1.7–7.7)
Neutrophils Relative %: 55 % (ref 43–77)
Platelets: 88 10*3/uL — ABNORMAL LOW (ref 150–400)
RBC: 2.45 MIL/uL — ABNORMAL LOW (ref 3.87–5.11)
RDW: 13.7 % (ref 11.5–15.5)
SMEAR REVIEW: DECREASED
WBC: 3.9 10*3/uL — ABNORMAL LOW (ref 4.0–10.5)

## 2015-03-25 LAB — COMPREHENSIVE METABOLIC PANEL
ALT: 18 U/L (ref 14–54)
AST: 17 U/L (ref 15–41)
Albumin: 2.7 g/dL — ABNORMAL LOW (ref 3.5–5.0)
Alkaline Phosphatase: 61 U/L (ref 38–126)
Anion gap: 4 — ABNORMAL LOW (ref 5–15)
BILIRUBIN TOTAL: 0.5 mg/dL (ref 0.3–1.2)
BUN: 37 mg/dL — AB (ref 6–20)
CHLORIDE: 103 mmol/L (ref 101–111)
CO2: 28 mmol/L (ref 22–32)
Calcium: 8.2 mg/dL — ABNORMAL LOW (ref 8.9–10.3)
Creatinine, Ser: 1.43 mg/dL — ABNORMAL HIGH (ref 0.44–1.00)
GFR calc Af Amer: 38 mL/min — ABNORMAL LOW (ref >60)
GFR, EST NON AFRICAN AMERICAN: 33 mL/min — AB (ref >60)
Glucose, Bld: 141 mg/dL — ABNORMAL HIGH (ref 65–99)
Potassium: 3.5 mmol/L (ref 3.5–5.1)
Sodium: 135 mmol/L (ref 135–145)
Total Protein: 10 g/dL — ABNORMAL HIGH (ref 6.5–8.1)

## 2015-03-25 LAB — C-REACTIVE PROTEIN: CRP: 0.7 mg/dL (ref <1.0)

## 2015-03-25 LAB — SEDIMENTATION RATE: SED RATE: 108 mm/h — AB (ref 0–22)

## 2015-03-25 LAB — LACTATE DEHYDROGENASE: LDH: 75 U/L — AB (ref 98–192)

## 2015-03-25 NOTE — Patient Instructions (Signed)
[place] at [facility] Discharge Instructions  RECOMMENDATIONS MADE BY THE CONSULTANT AND ANY TEST RESULTS WILL BE SENT TO YOUR REFERRING PHYSICIAN.  Exam and discussion by Dr. Whitney Muse. Will get bone survey done today.  This can be done today before you leave. Will hold the zometa until after we see results of the survey. Follow-up appointment with labs, possible zometa infusion and office visit in 1 week.  Thank you for choosing [place] at [facility] to provide your oncology and hematology care.  To afford each patient quality time with our provider, please arrive at least 15 minutes before your scheduled appointment time.    You need to re-schedule your appointment should you arrive 10 or more minutes late.  We strive to give you quality time with our providers, and arriving late affects you and other patients whose appointments are after yours.  Also, if you no show three or more times for appointments you may be dismissed from the clinic at the providers discretion.     Again, thank you for choosing [facility].  Our hope is that these requests will decrease the amount of time that you wait before being seen by our physicians.       _____________________________________________________________  Should you have questions after your visit to [facility], please contact our office at [phone] between the hours of 8:30 a.m. and 4:30 p.m.  Voicemails left after 4:30 p.m. will not be returned until the following business day.  For prescription refill requests, have your pharmacy contact our office.

## 2015-03-25 NOTE — Progress Notes (Signed)
[name] [address] [place] [address]    DIAGNOSIS:  Anemia IgG 10,600 mg/dl with total protein 13.4 g/dl 8.68 g/dl monoclonal protein IgG kappa Beta 2 microglobulin at 8.1 mg/L BMBX on 02/08/2015  CURRENT THERAPY: Revlimid/1 mg coumadin/dexamethasone  INTERVAL HISTORY: [name] 81 y.o. female returns for a bone marrow biopsy and aspirate. She based upon laboratory studies has myeloma. She also has dementia, her daughter is very involved in her care, but has difficulties because of her work schedule giving the patient back and forth to the clinic on a regular basis. They are considering moving her to another care home.    She reports left hip/left flank pain. She's eating well and is compliant with her medication.  MEDICAL HISTORY: Past Medical History  Diagnosis Date  . Chronic leg pain   . Poor historian   . Psychosis   . Schizoaffective disorder   . Hypertension   . High cholesterol   . Dementia   . Thrombocytopenia 11/10/2014  . Normocytic anemia 11/10/2014  . Elevated total protein 11/10/2014  . Multiple myeloma 02/17/2015  . Arthritis     has HEMORRHOIDS, INTERNAL; GERD; OTHER DYSPHAGIA; CERVICAL MUSCLE STRAIN; COLONIC POLYPS, ADENOMATOUS, HX OF; Dementia without behavioral disturbance; Altered mental status; Essential hypertension; Routine gynecological examination; Cervicitis; Vaginal bleeding; Schizoaffective disorder, unspecified type; Vagina bleeding; Anemia; Normocytic anemia; Thrombocytopenia; Elevated total protein; Constipation; and Multiple myeloma on her problem list.     has No Known Allergies.     Current Outpatient Prescriptions on File Prior to Visit  Medication Sig Dispense Refill  . amLODipine (NORVASC) 5 MG tablet Take 1 tablet (5 mg total) by mouth daily.    . calcium-vitamin D (OSCAL WITH D) 500-200 MG-UNIT per tablet Take 1 tablet by mouth 2 (two) times daily. 60 tablet 11  . clonazePAM (KLONOPIN) 0.25  MG disintegrating tablet Take 0.25 mg by mouth at bedtime.    Marland Kitchen dexamethasone (DECADRON) 4 MG tablet Take 10 tablets (40 mg total) by mouth once a week. 40 tablet 1  . fentaNYL (DURAGESIC - DOSED MCG/HR) 12 MCG/HR Place 1 patch (12.5 mcg total) onto the skin every 3 (three) days. 5 patch 0  . ferrous sulfate 325 (65 FE) MG tablet Take 1 tablet (325 mg total) by mouth 3 (three) times daily with meals.    . fluticasone (FLONASE) 50 MCG/ACT nasal spray Place 2 sprays into both nostrils daily.    Marland Kitchen lenalidomide (REVLIMID) 10 MG capsule Take 1 capsule (10 mg total) by mouth daily. 21 capsule 0  . magnesium hydroxide (MILK OF MAGNESIA) 400 MG/5ML suspension Take 30 mLs by mouth daily as needed (bowel mobility).     . Melatonin 3 MG TABS Take 1 tablet by mouth at bedtime as needed (insomnia).     . Multiple Vitamins-Minerals (MULTIVITAMIN WITH MINERALS) tablet Take 1 tablet by mouth daily.    . ondansetron (ZOFRAN) 8 MG tablet Take 1 tablet (8 mg total) by mouth every 8 (eight) hours as needed for nausea or vomiting. 45 tablet 2  . polyethylene glycol (MIRALAX / GLYCOLAX) packet Take 17 g by mouth daily.    Marland Kitchen acetaminophen (TYLENOL) 500 MG tablet Take 500 mg by mouth 3 (three) times daily as needed for mild pain.     Marland Kitchen HYDROcodone-acetaminophen (NORCO/VICODIN) 5-325 MG per tablet Take 1 tablet by mouth every 4 (four) hours as needed (pain). (Patient not taking: Reported on 03/25/2015) 20 tablet 0  .  warfarin (COUMADIN) 1 MG tablet Take 1 tablet (1 mg total) by mouth daily. (Patient not taking: Reported on 02/26/2015) 30 tablet 3   No current facility-administered medications on file prior to visit.     SURGICAL HISTORY: Past Surgical History  Procedure Laterality Date  . Tubal ligation    . Foot surgery      SOCIAL HISTORY: History   Social History  . Marital Status: Widowed    Spouse Name: N/A  . Number of Children: N/A  . Years of Education: N/A   Occupational History  . Not on file.    Social History Main Topics  . Smoking status: Never Smoker   . Smokeless tobacco: Not on file  . Alcohol Use: No  . Drug Use: No  . Sexual Activity: Not Currently    Birth Control/ Protection: Post-menopausal   Other Topics Concern  . Not on file   Social History Narrative    FAMILY HISTORY: Family History  Problem Relation Age of Onset  . Diabetes Mother     Review of Systems  Constitutional: Positive for malaise/fatigue.  HENT: Negative.   Eyes: Negative.   Respiratory: Positive for shortness of breath.   Cardiovascular: Negative.   Gastrointestinal: Negative.   Genitourinary: Positive for frequency.  Musculoskeletal: Negative.   Skin: Negative.   Neurological: Negative.   Endo/Heme/Allergies: Negative.   Psychiatric/Behavioral: Positive for memory loss. The patient is nervous/anxious.     PHYSICAL EXAMINATION  ECOG PERFORMANCE STATUS: 1 - Symptomatic but completely ambulatory  Filed Vitals:   03/25/15 0930  BP: 125/54  Pulse: 100  Temp: 98.5 F (36.9 C)  Resp: 16    Physical Exam  Constitutional: She is oriented to person, place, and time and well-developed, well-nourished, and in no distress.  HENT:  Head: Normocephalic and atraumatic.  Nose: Nose normal.  Mouth/Throat: Oropharynx is clear and moist. No oropharyngeal exudate.  Eyes: Conjunctivae and EOM are normal. Pupils are equal, round, and reactive to light. Right eye exhibits no discharge. Left eye exhibits no discharge. No scleral icterus.  Neck: Normal range of motion. Neck supple. No tracheal deviation present. No thyromegaly present.  Cardiovascular: Normal rate, regular rhythm and normal heart sounds.  Exam reveals no gallop and no friction rub.   No murmur heard. Pulmonary/Chest: Effort normal and breath sounds normal. She has no wheezes. She has no rales.  Abdominal: Soft. Bowel sounds are normal. She exhibits no distension and no mass. There is no rebound and no guarding. Pain with  palpation over left flank and rib area Musculoskeletal: Normal range of motion. She exhibits no edema.  Lymphadenopathy:    She has no cervical adenopathy.  Neurological: She is alert and oriented to person, place, and time. She has normal reflexes. No cranial nerve deficit. Gait normal. Coordination normal.  Skin: Skin is warm and dry. No rash noted.  Psychiatric: Mood, memory, affect and judgment normal.  Nursing note and vitals reviewed.   LABORATORY DATA:  Results for Janet, [name] (MRN [id number]) as of 03/25/2015 12:50  Ref. Range 03/25/2015 09:23  Sodium Latest Ref Range: 135-145 mmol/L 135  Potassium Latest Ref Range: 3.5-5.1 mmol/L 3.5  Chloride Latest Ref Range: 101-111 mmol/L 103  CO2 Latest Ref Range: 22-32 mmol/L 28  BUN Latest Ref Range: 6-20 mg/dL 37 (H)  Creatinine Latest Ref Range: 0.44-1.00 mg/dL 1.43 (H)  Calcium Latest Ref Range: 8.9-10.3 mg/dL 8.2 (L)  EGFR (Non-African Amer.) Latest Ref Range: >60 mL/min 33 (L)  EGFR (African American)  Latest Ref Range: >60 mL/min 38 (L)  Glucose Latest Ref Range: 65-99 mg/dL 141 (H)  Anion gap Latest Ref Range: 5-15  4 (L)  Alkaline Phosphatase Latest Ref Range: 38-126 U/L 61  Albumin Latest Ref Range: 3.5-5.0 g/dL 2.7 (L)  AST Latest Ref Range: 15-41 U/L 17  ALT Latest Ref Range: 14-54 U/L 18  Total Protein Latest Ref Range: 6.5-8.1 g/dL 10.0 (H)  Total Bilirubin Latest Ref Range: 0.3-1.2 mg/dL 0.5  LDH Latest Ref Range: 98-192 U/L 75 (L)  WBC Latest Ref Range: 4.0-10.5 K/uL 3.9 (L)  RBC Latest Ref Range: 3.87-5.11 MIL/uL 2.45 (L)  Hemoglobin Latest Ref Range: 12.0-15.0 g/dL 7.8 (L)  HCT Latest Ref Range: 36.0-46.0 % 23.2 (L)  MCV Latest Ref Range: 78.0-100.0 fL 94.7  MCH Latest Ref Range: 26.0-34.0 pg 31.8  MCHC Latest Ref Range: 30.0-36.0 g/dL 33.6  RDW Latest Ref Range: 11.5-15.5 % 13.7  Platelets Latest Ref Range: 150-400 K/uL 88 (L)  Neutrophils Latest Ref Range: 43-77 % 55  Lymphocytes Latest Ref Range: 12-46 %  33  Monocytes Relative Latest Ref Range: 3-12 % 10  Eosinophil Latest Ref Range: 0-5 % 2  Basophil Latest Ref Range: 0-1 % 1  NEUT# Latest Ref Range: 1.7-7.7 K/uL 2.1  Lymphocyte # Latest Ref Range: 0.7-4.0 K/uL 1.3  Monocyte # Latest Ref Range: 0.1-1.0 K/uL 0.4  Eosinophils Absolute Latest Ref Range: 0.0-0.7 K/uL 0.1  Basophils Absolute Latest Ref Range: 0.0-0.1 K/uL 0.0  Smear Review Unknown PLATELETS APPEAR ...  Sed Rate Latest Ref Range: 0-22 mm/hr 108 (H)    BMBX on 02/08/2015 58% plasma cells, kappa restricted BONE MARROW ASPIRATE: Paucispicular, but cellular. Erythroid precursors: Overall reduced. No significant dysplasia. Granulocytic precursors: Overall reduced. No significant dysplasia. No increase in blasts. Megakaryocytes: Present and morphologically unremarkable. Lymphocytes/plasma cells: There is a marked increase in plasma cells (58%) with atypical forms including large forms and prominent nucleoli. Lymphocytes are not increased. TOUCH PREPARATIONS: Similar to aspirate smears. CLOT and BIOPSY: The core biopsy is small, but hyper cellular for age (80%). There is a marked increase in plasma cells, including large clusters. There is admixed trilineage hematopoiesis. There are no atypical lymphoid aggregates. The clot section does not contain marrow tissue. IRON STAIN: Iron stains are performed on a bone marrow aspirate smear and section of clot. The controls stained appropriately. There are no particles on the aspirate or clot section for evaluation. Storage Iron: N/A. Ringed Sideroblasts: N/A. ADDITIONAL DATA / TESTING: Cytogenetics was ordered, including FISH. Per records the patient has a M-spike with IgG kappa (8.68 g/dL).    ASSESSMENT and THERAPY PLAN:   Multiple myeloma, IgG kappa Profound anemia Dementia Left posterior rib pain  She is on revlimid/dex and 1 mg coumadin. This regimen was chosen for tolerance and convenience as the patient depends upon her  working daughter for transportation. She is doing well in regards to tolerance. Total protein is finally declining, myeloma labs are pending today.   She has not had her myeloma survey yet and we will try to arrange this today while they are here. I have not started Zometa and the patient, her GFR is approximately 30. We will readdress whether or not she needs Zometa once I see her myeloma survey.  Her myeloma survey will let us look at her posterior left ribs where she is complaining of pain. I will keep her daughter apprised of the results when available.  Anemia  Hemoglobin is 7.8  today. The patient is asymptomatic and  vitals are stable. We will continue to monitor her CBC closely.   We will continue with current therapy. We will plan on bringing her and again next week to review laboratory results and recheck a CBC.  All questions were answered. The patient knows to call the clinic with any problems, questions or concerns. We can certainly see the patient much sooner if necessary. This note was signed electronically  This document serves as a record of services personally performed by Ancil Linsey, MD. It was created on her behalf by Pearlie Oyster, a trained medical scribe. The creation of this record is based on the scribe's personal observations and the provider's statements to them. This document has been checked and approved by the attending provider.    I have reviewed the above documentation for accuracy and completeness, and I agree with the above.  Kelby Fam. Osmani Kersten MD

## 2015-03-25 NOTE — Progress Notes (Signed)
Labs drawn

## 2015-03-26 LAB — KAPPA/LAMBDA LIGHT CHAINS
KAPPA FREE LGHT CHN: 106.14 mg/L — AB (ref 3.30–19.40)
KAPPA, LAMDA LIGHT CHAIN RATIO: 20.81 — AB (ref 0.26–1.65)
Lambda free light chains: 5.1 mg/L — ABNORMAL LOW (ref 5.71–26.30)

## 2015-03-26 LAB — BETA 2 MICROGLOBULIN, SERUM: Beta-2 Microglobulin: 5.4 mg/L — ABNORMAL HIGH (ref 0.6–2.4)

## 2015-03-29 LAB — MULTIPLE MYELOMA PANEL, SERUM
ALBUMIN SERPL ELPH-MCNC: 3.4 g/dL (ref 3.2–5.6)
ALPHA 1: 0.3 g/dL (ref 0.1–0.4)
ALPHA2 GLOB SERPL ELPH-MCNC: 0.7 g/dL (ref 0.4–1.2)
Albumin/Glob SerPl: 0.6 — ABNORMAL LOW (ref 0.7–2.0)
B-GLOBULIN SERPL ELPH-MCNC: 0.9 g/dL (ref 0.6–1.3)
Gamma Glob SerPl Elph-Mcnc: 4.6 g/dL — ABNORMAL HIGH (ref 0.5–1.6)
Globulin, Total: 6.5 g/dL — ABNORMAL HIGH (ref 2.0–4.5)
IgA: 21 mg/dL — ABNORMAL LOW (ref 64–422)
IgG (Immunoglobin G), Serum: 5632 mg/dL — ABNORMAL HIGH (ref 700–1600)
M Protein SerPl Elph-Mcnc: 4.5 g/dL — ABNORMAL HIGH
TOTAL PROTEIN ELP: 9.9 g/dL — AB (ref 6.0–8.5)

## 2015-03-31 ENCOUNTER — Encounter (HOSPITAL_BASED_OUTPATIENT_CLINIC_OR_DEPARTMENT_OTHER): Payer: Medicare Other

## 2015-03-31 ENCOUNTER — Encounter: Payer: Self-pay | Admitting: Dietician

## 2015-03-31 ENCOUNTER — Encounter (HOSPITAL_COMMUNITY): Payer: Self-pay | Admitting: Oncology

## 2015-03-31 ENCOUNTER — Encounter (HOSPITAL_BASED_OUTPATIENT_CLINIC_OR_DEPARTMENT_OTHER): Payer: Medicare Other | Admitting: Oncology

## 2015-03-31 VITALS — BP 141/64 | HR 84 | Temp 98.7°F | Resp 18 | Wt 120.7 lb

## 2015-03-31 VITALS — BP 129/50 | HR 77 | Temp 98.0°F | Resp 18

## 2015-03-31 DIAGNOSIS — C9000 Multiple myeloma not having achieved remission: Secondary | ICD-10-CM

## 2015-03-31 DIAGNOSIS — M8580 Other specified disorders of bone density and structure, unspecified site: Secondary | ICD-10-CM

## 2015-03-31 DIAGNOSIS — R634 Abnormal weight loss: Secondary | ICD-10-CM | POA: Diagnosis not present

## 2015-03-31 DIAGNOSIS — D649 Anemia, unspecified: Secondary | ICD-10-CM | POA: Diagnosis not present

## 2015-03-31 HISTORY — DX: Other specified disorders of bone density and structure, unspecified site: M85.80

## 2015-03-31 LAB — COMPREHENSIVE METABOLIC PANEL
ALBUMIN: 2.6 g/dL — AB (ref 3.5–5.0)
ALK PHOS: 70 U/L (ref 38–126)
ALT: 16 U/L (ref 14–54)
AST: 17 U/L (ref 15–41)
BUN: 26 mg/dL — ABNORMAL HIGH (ref 6–20)
CO2: 29 mmol/L (ref 22–32)
CREATININE: 1.26 mg/dL — AB (ref 0.44–1.00)
Calcium: 8.1 mg/dL — ABNORMAL LOW (ref 8.9–10.3)
Chloride: 103 mmol/L (ref 101–111)
GFR calc Af Amer: 45 mL/min — ABNORMAL LOW (ref >60)
GFR calc non Af Amer: 39 mL/min — ABNORMAL LOW (ref >60)
Glucose, Bld: 96 mg/dL (ref 65–99)
Potassium: 3.6 mmol/L (ref 3.5–5.1)
SODIUM: 134 mmol/L — AB (ref 135–145)
TOTAL PROTEIN: 9.7 g/dL — AB (ref 6.5–8.1)
Total Bilirubin: 0.4 mg/dL (ref 0.3–1.2)

## 2015-03-31 LAB — CBC WITH DIFFERENTIAL/PLATELET
Basophils Absolute: 0 10*3/uL (ref 0.0–0.1)
Basophils Relative: 1 % (ref 0–1)
EOS ABS: 0.1 10*3/uL (ref 0.0–0.7)
Eosinophils Relative: 2 % (ref 0–5)
HCT: 24.1 % — ABNORMAL LOW (ref 36.0–46.0)
Hemoglobin: 8.2 g/dL — ABNORMAL LOW (ref 12.0–15.0)
LYMPHS ABS: 1.2 10*3/uL (ref 0.7–4.0)
Lymphocytes Relative: 23 % (ref 12–46)
MCH: 32 pg (ref 26.0–34.0)
MCHC: 34 g/dL (ref 30.0–36.0)
MCV: 94.1 fL (ref 78.0–100.0)
Monocytes Absolute: 0.6 10*3/uL (ref 0.1–1.0)
Monocytes Relative: 11 % (ref 3–12)
NEUTROS ABS: 3.3 10*3/uL (ref 1.7–7.7)
NEUTROS PCT: 63 % (ref 43–77)
PLATELETS: 150 10*3/uL (ref 150–400)
RBC: 2.56 MIL/uL — AB (ref 3.87–5.11)
RDW: 14.2 % (ref 11.5–15.5)
WBC: 5.3 10*3/uL (ref 4.0–10.5)

## 2015-03-31 MED ORDER — ZOLEDRONIC ACID 4 MG/5ML IV CONC
3.0000 mg | Freq: Once | INTRAVENOUS | Status: AC
  Administered 2015-03-31: 3 mg via INTRAVENOUS
  Filled 2015-03-31: qty 3.75

## 2015-03-31 MED ORDER — SODIUM CHLORIDE 0.9 % IV SOLN
Freq: Once | INTRAVENOUS | Status: AC
  Administered 2015-03-31: 13:00:00 via INTRAVENOUS

## 2015-03-31 NOTE — Progress Notes (Signed)
[name] Tolerated zometa infusion Discharged ambulatory

## 2015-03-31 NOTE — Progress Notes (Signed)
Consulted to assess pt's nutritional status  Contacted Pt's Nursing Home  Wt Readings from Last 10 Encounters:  03/31/15 120 lb 11.2 oz (54.749 kg)  03/25/15 128 lb (58.06 kg)  03/12/15 125 lb 9.6 oz (56.972 kg)  02/26/15 123 lb 14.4 oz (56.201 kg)  01/20/15 128 lb 8 oz (58.287 kg)  01/19/15 128 lb 9.6 oz (58.333 kg)  01/04/15 125 lb 3.2 oz (56.79 kg)  12/08/14 126 lb 6.4 oz (57.335 kg)  11/09/14 130 lb (58.968 kg)  08/04/14 129 lb (58.514 kg)  Patient weight has fallen by about 3 lbs in 1 month, 10 lbs in 5 months.   Pt is a resident at [facility]. Upon calling, I was extremely fortunate to get a hold of their dietitian, Kaitlyn (sp), who is only there once every other week.  I discussed the concern over her recent weight loss. They have her weight at 125 lbs. The patient has not had a significant weight loss (5% in 1 month) according to their records and she felt the pt was about at baseline.   The Rd thought the patient has been eating well, but she said she would make sure.   Unfortunately, their facility does not use ensure. They have a house brand that has 200 kcals, 6 g Pro in 4 oz. This is more dense in calories/protein, but drinking 3 would still be less pro/cals then Ensure.   Their Dietitian said she would put the pt on weekly weights and monitor her closely to ensure no further weight is lost.   Burtis Junes RD, LDN Nutrition Pager: [phone] 03/31/2015 4:13 PM

## 2015-03-31 NOTE — Progress Notes (Signed)
Labs drawn

## 2015-03-31 NOTE — Patient Instructions (Signed)
[place] at [facility]  Discharge Instructions:  zometa infusion Follow up as scheduled Call the clinic if you have any questions or concerns _______________________________________________________________  Thank you for choosing [place] at [facility] to provide your oncology and hematology care.  To afford each patient quality time with our providers, please arrive at least 15 minutes before your scheduled appointment.  You need to re-schedule your appointment if you arrive 10 or more minutes late.  We strive to give you quality time with our providers, and arriving late affects you and other patients whose appointments are after yours.  Also, if you no show three or more times for appointments you may be dismissed from the clinic.  Again, thank you for choosing [place] at [place] hope is that these requests will allow you access to exceptional care and in a timely manner. _______________________________________________________________  If you have questions after your visit, please contact our office at [phone] between the hours of 8:30 a.m. and 5:00 p.m. Voicemails left after 4:30 p.m. will not be returned until the following business day. _______________________________________________________________  For prescription refill requests, have your pharmacy contact our office. _______________________________________________________________  Recommendations made by the consultant and any test results will be sent to your referring physician. _______________________________________________________________

## 2015-03-31 NOTE — Assessment & Plan Note (Addendum)
IgG multiple myeloma with kappa light chain specificity showing a response thus far in IgG levels and kappa free light chain value.  She is currently on Revlimid 10 mg 21 days on and 7 days off/1 mg coumadin daily/dexamethasone 40 mg weekly.  Her weight is down despite a strong appetite.  She eats well with family, but her caloric intake at the nursing facility is quesitonable.  I have ordered Ensure/Boost three times daily at the nursing facility.  This order is typed and provided to the patient's family for the nursing facility.  It can be found in the letters tab of chart review.  I will consult nutritionist as well for assistance.  Her dementia plays a part in her weight loss as well.  She will start Zometa today at a reduced dose of 3 mg and reduced frequency of every 8 weeks.  Supportive therapy plan altered to reflect this change.  She is at home with her family today and returning to the nursing facility tomorrow.  Family notes that the nursing facility did not provide them with her Revlimid medication.  Therefor she will miss today's dose which is day #3 of current cycle.  Labs in 2 weeks: CBC diff, CMET  Labs in 4 weeks: CBC diff, CMET, LDH, ESR, CRP, MM panel, B2M.  Return in 2 weeks for follow-up

## 2015-03-31 NOTE — Patient Instructions (Signed)
..  [name] at [facility] Discharge Instructions  RECOMMENDATIONS MADE BY THE CONSULTANT AND ANY TEST RESULTS WILL BE SENT TO YOUR REFERRING PHYSICIAN.  Zometa today and every 8 weeks Continue revlimid 21 days on and 7 days off Continue Dexamethasone 40 mg weekly  Order ensure/boost 3 times a day Lab every 2 weeks And Dr. Whitney Muse in 2 weeks   Thank you for choosing [place] at [facility] to provide your oncology and hematology care.  To afford each patient quality time with our provider, please arrive at least 15 minutes before your scheduled appointment time.    You need to re-schedule your appointment should you arrive 10 or more minutes late.  We strive to give you quality time with our providers, and arriving late affects you and other patients whose appointments are after yours.  Also, if you no show three or more times for appointments you may be dismissed from the clinic at the providers discretion.     Again, thank you for choosing [facility].  Our hope is that these requests will decrease the amount of time that you wait before being seen by our physicians.       _____________________________________________________________  Should you have questions after your visit to [facility], please contact our office at [phone] between the hours of 8:30 a.m. and 4:30 p.m.  Voicemails left after 4:30 p.m. will not be returned until the following business day.  For prescription refill requests, have your pharmacy contact our office.

## 2015-03-31 NOTE — Progress Notes (Signed)
[place] [address] [place] [address]  Osteopenia  Multiple myeloma - Plan: CBC with Differential, Comprehensive metabolic panel, CBC with Differential, Comprehensive metabolic panel, Lactate dehydrogenase, Sedimentation rate, C-reactive protein, Beta 2 microglobuline, serum, Multiple myeloma panel, serum, Kappa/lambda light chains  Weight loss - Plan: Amb Referral to Nutrition and Diabetic E  CURRENT THERAPY: Revlimid 10 mg 21 days on and 7 days off/1 mg coumadin daily/dexamethasone 40 mg weekly.  INTERVAL HISTORY: [name] 81 y.o. female returns for followup of IgG multiple myeloma with kappa light chain specificity showing a response thus far in IgG levels and kappa free light chain value.  I personally reviewed and went over laboratory results with the patient.  The results are noted within this dictation.  Her labs are improved.   I personally reviewed and went over radiographic studies with the patient.  The results are noted within this dictation.  Bone scan demonstrated osteopenia without any evidence of bone mets.  She will start Zometa today at a reduced dose given her renal function.  Additionally, we will reduce the frequency to every 8 weeks (instead of the typical every 4 weeks) at this time to avoid insulting her renal function.  It is noted that her weight is down, approximately 4 lbs since April 2016.  She reports that her appetite is strong and family notes that she eats well with them, but at the nursing home "they do not season food good enough."  She does like ice cream and I recommended making milkshakes throughout the day to supplement her caloric intake.  I also recommended Ensure/Boost three times daily.  I will consult nutritionist to assist.  Hematologically, the patient denies any complaints and ROS questioning is negative.  Past Medical History  Diagnosis Date  . Chronic leg pain   . Poor historian   . Psychosis    . Schizoaffective disorder   . Hypertension   . High cholesterol   . Dementia   . Thrombocytopenia 11/10/2014  . Normocytic anemia 11/10/2014  . Elevated total protein 11/10/2014  . Multiple myeloma 02/17/2015  . Arthritis   . Osteopenia 03/31/2015    On Bone scan on 03/25/2015    has HEMORRHOIDS, INTERNAL; GERD; OTHER DYSPHAGIA; CERVICAL MUSCLE STRAIN; COLONIC POLYPS, ADENOMATOUS, HX OF; Dementia without behavioral disturbance; Altered mental status; Essential hypertension; Routine gynecological examination; Cervicitis; Vaginal bleeding; Schizoaffective disorder, unspecified type; Vagina bleeding; Anemia; Normocytic anemia; Thrombocytopenia; Elevated total protein; Constipation; Multiple myeloma; and Osteopenia on her problem list.     has No Known Allergies.  Current Outpatient Prescriptions on File Prior to Visit  Medication Sig Dispense Refill  . acetaminophen (TYLENOL) 500 MG tablet Take 500 mg by mouth 3 (three) times daily as needed for mild pain.     Marland Kitchen amLODipine (NORVASC) 5 MG tablet Take 1 tablet (5 mg total) by mouth daily.    . calcium-vitamin D (OSCAL WITH D) 500-200 MG-UNIT per tablet Take 1 tablet by mouth 2 (two) times daily. 60 tablet 11  . clonazePAM (KLONOPIN) 0.25 MG disintegrating tablet Take 0.25 mg by mouth at bedtime.    Marland Kitchen dexamethasone (DECADRON) 4 MG tablet Take 10 tablets (40 mg total) by mouth once a week. 40 tablet 1  . fentaNYL (DURAGESIC - DOSED MCG/HR) 12 MCG/HR Place 1 patch (12.5 mcg total) onto the skin every 3 (three) days. 5 patch 0  . ferrous sulfate 325 (65 FE) MG tablet Take 1 tablet (  325 mg total) by mouth 3 (three) times daily with meals.    . fluticasone (FLONASE) 50 MCG/ACT nasal spray Place 2 sprays into both nostrils daily.    Marland Kitchen HYDROcodone-acetaminophen (NORCO/VICODIN) 5-325 MG per tablet Take 1 tablet by mouth every 4 (four) hours as needed (pain). 20 tablet 0  . magnesium hydroxide (MILK OF MAGNESIA) 400 MG/5ML suspension Take 30 mLs by mouth  daily as needed (bowel mobility).     . Melatonin 3 MG TABS Take 1 tablet by mouth at bedtime as needed (insomnia).     . Multiple Vitamins-Minerals (MULTIVITAMIN WITH MINERALS) tablet Take 1 tablet by mouth daily.    . ondansetron (ZOFRAN) 8 MG tablet Take 1 tablet (8 mg total) by mouth every 8 (eight) hours as needed for nausea or vomiting. 45 tablet 2  . polyethylene glycol (MIRALAX / GLYCOLAX) packet Take 17 g by mouth daily.    Marland Kitchen lenalidomide (REVLIMID) 10 MG capsule Take 1 capsule (10 mg total) by mouth daily. 21 capsule 0  . warfarin (COUMADIN) 1 MG tablet Take 1 tablet (1 mg total) by mouth daily. (Patient not taking: Reported on 02/26/2015) 30 tablet 3   No current facility-administered medications on file prior to visit.    Past Surgical History  Procedure Laterality Date  . Tubal ligation    . Foot surgery      Denies any headaches, dizziness, double vision, fevers, chills, night sweats, nausea, vomiting, diarrhea, constipation, chest pain, heart palpitations, shortness of breath, blood in stool, black tarry stool, urinary pain, urinary burning, urinary frequency, hematuria.   PHYSICAL EXAMINATION  ECOG PERFORMANCE STATUS: 1 - Symptomatic but completely ambulatory  Filed Vitals:   03/31/15 1043  BP: 141/64  Pulse: 84  Temp: 98.7 F (37.1 C)  Resp: 18    GENERAL:alert, no distress, well developed, comfortable, cooperative and smiling SKIN: skin color, texture, turgor are normal, no rashes or significant lesions HEAD: Normocephalic, No masses, lesions, tenderness or abnormalities EYES: normal, PERRLA, EOMI, Conjunctiva are pink and non-injected EARS: External ears normal OROPHARYNX:lips, buccal mucosa, and tongue normal and mucous membranes are moist  NECK: supple, trachea midline LYMPH:  not examined BREAST:not examined LUNGS: clear to auscultation  HEART: regular rate & rhythm ABDOMEN:abdomen soft and normal bowel sounds BACK: Back symmetric, no  curvature. EXTREMITIES:less then 2 second capillary refill, no skin discoloration, no cyanosis  NEURO: no focal motor/sensory deficits, gait normal   LABORATORY DATA: CBC    Component Value Date/Time   WBC 5.3 03/31/2015 1045   RBC 2.56* 03/31/2015 1045   RBC 2.54* 01/04/2015 1700   HGB 8.2* 03/31/2015 1045   HCT 24.1* 03/31/2015 1045   PLT 150 03/31/2015 1045   MCV 94.1 03/31/2015 1045   MCH 32.0 03/31/2015 1045   MCHC 34.0 03/31/2015 1045   RDW 14.2 03/31/2015 1045   LYMPHSABS 1.2 03/31/2015 1045   MONOABS 0.6 03/31/2015 1045   EOSABS 0.1 03/31/2015 1045   BASOSABS 0.0 03/31/2015 1045      Chemistry      Component Value Date/Time   NA 134* 03/31/2015 1045   K 3.6 03/31/2015 1045   CL 103 03/31/2015 1045   CO2 29 03/31/2015 1045   BUN 26* 03/31/2015 1045   CREATININE 1.26* 03/31/2015 1045      Component Value Date/Time   CALCIUM 8.1* 03/31/2015 1045   ALKPHOS 70 03/31/2015 1045   AST 17 03/31/2015 1045   ALT 16 03/31/2015 1045   BILITOT 0.4 03/31/2015 1045  RADIOGRAPHIC STUDIES:  Dg Bone Survey Met  03/25/2015   CLINICAL DATA:  Multiple myeloma.  EXAM: METASTATIC BONE SURVEY  COMPARISON:  Chest radiograph 11/09/2014. Head CT 06/01/2012. CT abdomen and pelvis 05/27/2012. Bilateral knee radiographs and pelvic radiograph 02/25/2012. Lumbar spine radiographs 10/14/2010.  FINDINGS: There is diffuse, generalized osteopenia. Small areas of slightly increased, rounded lucency are present in the shafts of multiple long bones, specifically the humeri and femora, without sizable discrete lytic lesions identified.  Mild-to-moderate enlargement of the cardiac silhouette is unchanged. Calcification and tortuosity are noted of the thoracic aorta. Pulmonary vascular congestion on the prior chest radiograph has resolved. Left basilar opacity has also resolved. No evidence of acute airspace consolidation, edema, pleural effusion, or pneumothorax.  Multilevel cervical disc  degeneration is present, worst at C3-4. Mild-to-moderate thoracic dextroscoliosis is present. Multilevel thoracic and lumbar spondylosis are noted. Minimal vertebral body height loss in the mid thoracic spine appears chronic and may be degenerative. Lower lumbar facet arthrosis is present. Grade 1 anterolisthesis of L4 on L5 is stable to slightly increased from 2011 lumbar spine radiographs.  There is mild enlargement of the diploic space of the skull which is similar to the prior head CT without discrete lytic lesions identified.  Degenerative changes are noted involving the right greater than left glenohumeral joints. Moderate to severe osteoarthrosis is present involving the medial compartments of the left greater than right knees. Severe right moderate left hip osteoarthrosis are present.  IMPRESSION: 1. Generalized osteopenia with slightly heterogeneous lucency in the shafts of the humeri and femora. No discrete, sizable lytic lesions identified. 2. No acute fracture. 3. Advanced osteoarthrosis involving the shoulders, hips, and knees.   Electronically Signed   By: Logan Bores   On: 03/25/2015 14:13     ASSESSMENT AND PLAN:  Multiple myeloma  IgG multiple myeloma with kappa light chain specificity showing a response thus far in IgG levels and kappa free light chain value.  She is currently on Revlimid 10 mg 21 days on and 7 days off/1 mg coumadin daily/dexamethasone 40 mg weekly.  Her weight is down despite a strong appetite.  She eats well with family, but her caloric intake at the nursing facility is quesitonable.  I have ordered Ensure/Boost three times daily at the nursing facility.  This order is typed and provided to the patient's family for the nursing facility.  It can be found in the letters tab of chart review.  I will consult nutritionist as well for assistance.  Her dementia plays a part in her weight loss as well.  She will start Zometa today at a reduced dose of 3 mg and reduced  frequency of every 8 weeks.  Supportive therapy plan altered to reflect this change.  She is at home with her family today and returning to the nursing facility tomorrow.  Family notes that the nursing facility did not provide them with her Revlimid medication.  Therefor she will miss today's dose which is day #3 of current cycle.  Labs in 2 weeks: CBC diff, CMET  Labs in 4 weeks: CBC diff, CMET, LDH, ESR, CRP, MM panel, B2M.  Return in 2 weeks for follow-up    THERAPY PLAN:  Continue treatment as planned.  All questions were answered. The patient knows to call the clinic with any problems, questions or concerns. We can certainly see the patient much sooner if necessary.  Patient and plan discussed with Dr. Ancil Linsey and she is in agreement with the aforementioned.  This note is electronically signed by: Doy Mince  03/31/2015 12:25 PM

## 2015-04-14 ENCOUNTER — Observation Stay (HOSPITAL_COMMUNITY): Payer: Medicare Other

## 2015-04-14 ENCOUNTER — Encounter (HOSPITAL_COMMUNITY): Payer: Self-pay | Admitting: Hematology & Oncology

## 2015-04-14 ENCOUNTER — Observation Stay (HOSPITAL_COMMUNITY)
Admission: AD | Admit: 2015-04-14 | Discharge: 2015-04-15 | Disposition: A | Discharge status: Home / Self Care | Payer: Medicare Other | Source: Ambulatory Visit | Attending: Internal Medicine | Admitting: Internal Medicine

## 2015-04-14 ENCOUNTER — Encounter (HOSPITAL_COMMUNITY): Payer: Self-pay | Admitting: *Deleted

## 2015-04-14 ENCOUNTER — Encounter (HOSPITAL_COMMUNITY): Payer: Medicare Other | Attending: Family Medicine | Admitting: Hematology & Oncology

## 2015-04-14 ENCOUNTER — Encounter (HOSPITAL_BASED_OUTPATIENT_CLINIC_OR_DEPARTMENT_OTHER): Payer: Medicare Other

## 2015-04-14 VITALS — BP 115/51 | HR 86 | Temp 99.3°F | Resp 16 | Wt 117.3 lb

## 2015-04-14 DIAGNOSIS — D696 Thrombocytopenia, unspecified: Secondary | ICD-10-CM | POA: Diagnosis not present

## 2015-04-14 DIAGNOSIS — N183 Chronic kidney disease, stage 3 unspecified: Secondary | ICD-10-CM | POA: Diagnosis present

## 2015-04-14 DIAGNOSIS — D649 Anemia, unspecified: Secondary | ICD-10-CM

## 2015-04-14 DIAGNOSIS — Z79899 Other long term (current) drug therapy: Secondary | ICD-10-CM | POA: Insufficient documentation

## 2015-04-14 DIAGNOSIS — R0602 Shortness of breath: Principal | ICD-10-CM

## 2015-04-14 DIAGNOSIS — F29 Unspecified psychosis not due to a substance or known physiological condition: Secondary | ICD-10-CM | POA: Diagnosis not present

## 2015-04-14 DIAGNOSIS — G8929 Other chronic pain: Secondary | ICD-10-CM | POA: Diagnosis not present

## 2015-04-14 DIAGNOSIS — R112 Nausea with vomiting, unspecified: Secondary | ICD-10-CM | POA: Diagnosis not present

## 2015-04-14 DIAGNOSIS — M8580 Other specified disorders of bone density and structure, unspecified site: Secondary | ICD-10-CM | POA: Insufficient documentation

## 2015-04-14 DIAGNOSIS — Z7951 Long term (current) use of inhaled steroids: Secondary | ICD-10-CM | POA: Diagnosis not present

## 2015-04-14 DIAGNOSIS — Z7901 Long term (current) use of anticoagulants: Secondary | ICD-10-CM | POA: Diagnosis not present

## 2015-04-14 DIAGNOSIS — M1990 Unspecified osteoarthritis, unspecified site: Secondary | ICD-10-CM | POA: Diagnosis not present

## 2015-04-14 DIAGNOSIS — C9000 Multiple myeloma not having achieved remission: Secondary | ICD-10-CM

## 2015-04-14 DIAGNOSIS — R739 Hyperglycemia, unspecified: Secondary | ICD-10-CM | POA: Diagnosis present

## 2015-04-14 DIAGNOSIS — I129 Hypertensive chronic kidney disease with stage 1 through stage 4 chronic kidney disease, or unspecified chronic kidney disease: Secondary | ICD-10-CM | POA: Insufficient documentation

## 2015-04-14 DIAGNOSIS — E780 Pure hypercholesterolemia: Secondary | ICD-10-CM | POA: Diagnosis not present

## 2015-04-14 DIAGNOSIS — R531 Weakness: Secondary | ICD-10-CM | POA: Diagnosis not present

## 2015-04-14 DIAGNOSIS — F0390 Unspecified dementia without behavioral disturbance: Secondary | ICD-10-CM

## 2015-04-14 LAB — CBC WITH DIFFERENTIAL/PLATELET
Basophils Absolute: 0.1 10*3/uL (ref 0.0–0.1)
Basophils Relative: 1 % (ref 0–1)
Eosinophils Absolute: 0.4 10*3/uL (ref 0.0–0.7)
Eosinophils Relative: 5 % (ref 0–5)
HEMATOCRIT: 20.8 % — AB (ref 36.0–46.0)
Hemoglobin: 7.2 g/dL — ABNORMAL LOW (ref 12.0–15.0)
LYMPHS PCT: 10 % — AB (ref 12–46)
Lymphs Abs: 0.7 10*3/uL (ref 0.7–4.0)
MCH: 33.2 pg (ref 26.0–34.0)
MCHC: 34.6 g/dL (ref 30.0–36.0)
MCV: 95.9 fL (ref 78.0–100.0)
MONOS PCT: 10 % (ref 3–12)
Monocytes Absolute: 0.7 10*3/uL (ref 0.1–1.0)
NEUTROS ABS: 5.2 10*3/uL (ref 1.7–7.7)
NEUTROS PCT: 74 % (ref 43–77)
PLATELETS: 209 10*3/uL (ref 150–400)
RBC: 2.17 MIL/uL — ABNORMAL LOW (ref 3.87–5.11)
RDW: 15.3 % (ref 11.5–15.5)
WBC: 7.1 10*3/uL (ref 4.0–10.5)

## 2015-04-14 LAB — COMPREHENSIVE METABOLIC PANEL
ALT: 32 U/L (ref 14–54)
ANION GAP: 8 (ref 5–15)
AST: 23 U/L (ref 15–41)
Albumin: 2.5 g/dL — ABNORMAL LOW (ref 3.5–5.0)
Alkaline Phosphatase: 49 U/L (ref 38–126)
BILIRUBIN TOTAL: 0.8 mg/dL (ref 0.3–1.2)
BUN: 25 mg/dL — AB (ref 6–20)
CHLORIDE: 99 mmol/L — AB (ref 101–111)
CO2: 26 mmol/L (ref 22–32)
Calcium: 7.7 mg/dL — ABNORMAL LOW (ref 8.9–10.3)
Creatinine, Ser: 1.34 mg/dL — ABNORMAL HIGH (ref 0.44–1.00)
GFR calc Af Amer: 42 mL/min — ABNORMAL LOW (ref >60)
GFR, EST NON AFRICAN AMERICAN: 36 mL/min — AB (ref >60)
GLUCOSE: 147 mg/dL — AB (ref 65–99)
Potassium: 3.8 mmol/L (ref 3.5–5.1)
Sodium: 133 mmol/L — ABNORMAL LOW (ref 135–145)
TOTAL PROTEIN: 9 g/dL — AB (ref 6.5–8.1)

## 2015-04-14 LAB — PROTIME-INR
INR: 1.35 (ref 0.00–1.49)
Prothrombin Time: 16.8 seconds — ABNORMAL HIGH (ref 11.6–15.2)

## 2015-04-14 LAB — FOLATE: Folate: 16 ng/mL (ref >5.9)

## 2015-04-14 LAB — HEPATIC FUNCTION PANEL
ALK PHOS: 50 U/L (ref 38–126)
ALT: 32 U/L (ref 14–54)
AST: 25 U/L (ref 15–41)
Albumin: 2.5 g/dL — ABNORMAL LOW (ref 3.5–5.0)
BILIRUBIN TOTAL: 0.7 mg/dL (ref 0.3–1.2)
Bilirubin, Direct: 0.1 mg/dL (ref 0.1–0.5)
Indirect Bilirubin: 0.6 mg/dL (ref 0.3–0.9)
TOTAL PROTEIN: 9.3 g/dL — AB (ref 6.5–8.1)

## 2015-04-14 LAB — RETICULOCYTES
RBC.: 1.98 MIL/uL — ABNORMAL LOW (ref 3.87–5.11)
RETIC COUNT ABSOLUTE: 31.7 10*3/uL (ref 19.0–186.0)
Retic Ct Pct: 1.6 % (ref 0.4–3.1)

## 2015-04-14 LAB — PREPARE RBC (CROSSMATCH)

## 2015-04-14 MED ORDER — ONDANSETRON HCL 4 MG/2ML IJ SOLN: 4.0000 mg | Freq: Four times a day (QID) | INTRAMUSCULAR | Status: DC | PRN

## 2015-04-14 MED ORDER — FENTANYL 12 MCG/HR TD PT72: 12.5000 ug | MEDICATED_PATCH | TRANSDERMAL | Status: DC

## 2015-04-14 MED ORDER — FLUTICASONE PROPIONATE 50 MCG/ACT NA SUSP
2.0000 | Freq: Every day | NASAL | Status: DC
  Filled 2015-04-14: qty 16

## 2015-04-14 MED ORDER — ALUM & MAG HYDROXIDE-SIMETH 200-200-20 MG/5ML PO SUSP: 30.0000 mL | Freq: Four times a day (QID) | ORAL | Status: DC | PRN

## 2015-04-14 MED ORDER — CLONAZEPAM 0.5 MG PO TABS
0.2500 mg | ORAL_TABLET | Freq: Every day | ORAL | Status: DC
  Administered 2015-04-14: 0.25 mg via ORAL
  Filled 2015-04-14: qty 1

## 2015-04-14 MED ORDER — MAGNESIUM HYDROXIDE 400 MG/5ML PO SUSP: 30.0000 mL | Freq: Every day | ORAL | Status: DC | PRN

## 2015-04-14 MED ORDER — CLONAZEPAM 0.25 MG PO TBDP: 0.2500 mg | ORAL_TABLET | Freq: Every day | ORAL | Status: DC

## 2015-04-14 MED ORDER — SODIUM CHLORIDE 0.9 % IV SOLN: Freq: Once | INTRAVENOUS | Status: DC

## 2015-04-14 MED ORDER — LENALIDOMIDE 10 MG PO CAPS
10.0000 mg | ORAL_CAPSULE | Freq: Every day | ORAL | Status: AC
  Administered 2015-04-15: 10 mg via ORAL
  Filled 2015-04-14 (×2): qty 1

## 2015-04-14 MED ORDER — ACETAMINOPHEN 500 MG PO TABS
500.0000 mg | ORAL_TABLET | Freq: Three times a day (TID) | ORAL | Status: DC | PRN
  Administered 2015-04-14: 500 mg via ORAL
  Filled 2015-04-14: qty 1

## 2015-04-14 MED ORDER — POLYETHYLENE GLYCOL 3350 17 G PO PACK
17.0000 g | PACK | Freq: Every day | ORAL | Status: DC
  Administered 2015-04-14 – 2015-04-15 (×2): 17 g via ORAL
  Filled 2015-04-14 (×2): qty 1

## 2015-04-14 MED ORDER — SODIUM CHLORIDE 0.9 % IV SOLN
INTRAVENOUS | Status: DC
  Administered 2015-04-14: 14:00:00 via INTRAVENOUS

## 2015-04-14 MED ORDER — FERROUS SULFATE 325 (65 FE) MG PO TABS
325.0000 mg | ORAL_TABLET | Freq: Three times a day (TID) | ORAL | Status: DC
  Administered 2015-04-14 – 2015-04-15 (×2): 325 mg via ORAL
  Filled 2015-04-14 (×2): qty 1

## 2015-04-14 MED ORDER — HYDROCODONE-ACETAMINOPHEN 5-325 MG PO TABS: 1.0000 | ORAL_TABLET | ORAL | Status: DC | PRN

## 2015-04-14 MED ORDER — ONDANSETRON HCL 4 MG PO TABS: 4.0000 mg | ORAL_TABLET | Freq: Four times a day (QID) | ORAL | Status: DC | PRN

## 2015-04-14 MED ORDER — ONDANSETRON HCL 4 MG PO TABS: 8.0000 mg | ORAL_TABLET | Freq: Three times a day (TID) | ORAL | Status: DC | PRN

## 2015-04-14 MED ORDER — DIPHENHYDRAMINE HCL 50 MG/ML IJ SOLN: 12.5000 mg | Freq: Four times a day (QID) | INTRAMUSCULAR | Status: DC | PRN

## 2015-04-14 NOTE — H&P (Signed)
[facility] History and Physical  CHERMAINE SCHNYDER ZPH:[id number] DOB: January 13, 1930 DOA: 04/14/2015  Referring physician: Whitney Muse PCP: [name], DO   Chief Complaint: anemia  HPI: [name] is a very pleasant 81 y.o. female with a history ofll, dementia, anemia, chronic leg pain presents to room 324 from oncology center chief complaint of anemia. She went to routine follow-up for her myeloma and had complaints of shortness of breath and generalized weakness. Initial evaluation reveals anemia.  Patient is resident of [facility] with mild dementia. Information is obtained from the daughters were at the bedside. He was in her usual state of health until the last day and a half where she complained of shortness of breath demonstrated some generalized weakness. Associated symptoms include nausea with one episode of vomiting. They deny coffee-ground emesis. No reported chest pain headache syncope or near-syncope. No fever chills diarrhea recent sick contacts. She's been eating and drinking her normal amount. He does complain of chronic right neck and shoulder pain but this is no worse than per her usual. Of note patient is on 1 mg of Coumadin daily but there has been no recent falls.  Workup reveals a hemoglobin of 7.2 hematocrit 20.8 sodium of 133 chloride 99 BUN 25 creatinine 1.34 serum glucose 147.   Review of Systems:  10 point review of systems complete all systems are negative per the patient and her daughters who assist due to patient's dementia  Past Medical History  Diagnosis Date  . Chronic leg pain   . Poor historian   . Psychosis   . Schizoaffective disorder   . Hypertension   . High cholesterol   . Dementia   . Thrombocytopenia 11/10/2014  . Normocytic anemia 11/10/2014  . Elevated total protein 11/10/2014  . Multiple myeloma 02/17/2015  . Arthritis   . Osteopenia 03/31/2015    On Bone scan on 03/25/2015   Past Surgical History  Procedure Laterality Date  . Tubal  ligation    . Foot surgery     Social History:  reports that she has never smoked. She does not have any smokeless tobacco history on file. She reports that she does not drink alcohol or use illicit drugs.  she is resident of [facility]. Evangeline Gula relates independently. No recent falls  No Known Allergies  Family History  Problem Relation Age of Onset  . Diabetes Mother      Prior to Admission medications   Medication Sig Start Date End Date Taking? Authorizing Provider  fentaNYL (DURAGESIC - DOSED MCG/HR) 12 MCG/HR Place 1 patch (12.5 mcg total) onto the skin every 3 (three) days. 07/24/14  Yes Barton Dubois, MD  HYDROcodone-acetaminophen (NORCO/VICODIN) 5-325 MG per tablet Take 1 tablet by mouth every 4 (four) hours as needed (pain). 07/24/14  Yes Barton Dubois, MD  acetaminophen (TYLENOL) 500 MG tablet Take 500 mg by mouth 3 (three) times daily as needed for mild pain.     Historical Provider, MD  amLODipine (NORVASC) 5 MG tablet Take 1 tablet (5 mg total) by mouth daily. 07/24/14   Barton Dubois, MD  clonazePAM (KLONOPIN) 0.25 MG disintegrating tablet Take 0.25 mg by mouth at bedtime.    Historical Provider, MD  dexamethasone (DECADRON) 4 MG tablet Take 10 tablets (40 mg total) by mouth once a week. 02/22/15   Baird Cancer, PA-C  ferrous sulfate 325 (65 FE) MG tablet Take 1 tablet (325 mg total) by mouth 3 (three) times daily with meals. 07/24/14   Barton Dubois,  MD  fluticasone (FLONASE) 50 MCG/ACT nasal spray Place 2 sprays into both nostrils daily.    Historical Provider, MD  lenalidomide (REVLIMID) 10 MG capsule Take 1 capsule (10 mg total) by mouth daily. 03/19/15   Baird Cancer, PA-C  magnesium hydroxide (MILK OF MAGNESIA) 400 MG/5ML suspension Take 30 mLs by mouth daily as needed (bowel mobility).     Historical Provider, MD  Multiple Vitamins-Minerals (MULTIVITAMIN WITH MINERALS) tablet Take 1 tablet by mouth daily.    Historical Provider, MD  ondansetron (ZOFRAN) 8 MG tablet  Take 1 tablet (8 mg total) by mouth every 8 (eight) hours as needed for nausea or vomiting. 02/22/15   Baird Cancer, PA-C  polyethylene glycol (MIRALAX / GLYCOLAX) packet Take 17 g by mouth daily.    Historical Provider, MD  warfarin (COUMADIN) 1 MG tablet Take 1 tablet (1 mg total) by mouth daily. 02/17/15   Baird Cancer, PA-C  Wheat Dextrin (BENEFIBER DRINK MIX) PACK Take by mouth.    Historical Provider, MD   Physical Exam: Filed Vitals:   04/14/15 1237  BP: 106/45  Pulse: 71  Temp: 98.3 F (36.8 C)  TempSrc: Oral  Resp: 16  Height: _0  (1.575 m)  Weight: 52.209 kg (115 lb 1.6 oz)  SpO2: 10%    Wt Readings from Last 3 Encounters:  04/14/15 52.209 kg (115 lb 1.6 oz)  04/14/15 53.207 kg (117 lb 4.8 oz)  03/31/15 54.749 kg (120 lb 11.2 oz)    General:  Appears calm and comfortable Eyes: PERRL, normal lids, irises & conjunctiva ENT: grossly normal hearing, lips & tongue Neck: no LAD, masses or thyromegaly Cardiovascular: RRR, no m/r/g. No LE edema.  Respiratory: CTA bilaterally, no w/r/r. Normal respiratory effort. Abdomen: soft, ntnd positive bowel sounds throughout  Skin: no rash or induration seen on limited exam Musculoskeletal: grossly normal tone BUE/BLE, mild muscle wasting  Psychiatric: grossly normal mood and affect, speech fluent and appropriate Neurologic: grossly non-focal. speech clear facial symmetry           Labs on Admission:  Basic Metabolic Panel:  Recent Labs Lab 04/14/15 0916  NA 133*  K 3.8  CL 99*  CO2 26  GLUCOSE 147*  BUN 25*  CREATININE 1.34*  CALCIUM 7.7*   Liver Function Tests:  Recent Labs Lab 04/14/15 0916  AST 25  23  ALT 32  32  ALKPHOS 50  49  BILITOT 0.7  0.8  PROT 9.3*  9.0*  ALBUMIN 2.5*  2.5*   No results for input(s): LIPASE, AMYLASE in the last 168 hours. No results for input(s): AMMONIA in the last 168 hours. CBC:  Recent Labs Lab 04/14/15 0916  WBC 7.1  NEUTROABS 5.2  HGB 7.2*  HCT 20.8*    MCV 95.9  PLT 209   Cardiac Enzymes: No results for input(s): CKTOTAL, CKMB, CKMBINDEX, TROPONINI in the last 168 hours.  BNP (last 3 results) No results for input(s): BNP in the last 8760 hours.  ProBNP (last 3 results) No results for input(s): PROBNP in the last 8760 hours.  CBG: No results for input(s): GLUCAP in the last 168 hours.  Radiological Exams on Admission: No results found.  EKG: I  Assessment/Plan Active Problems:   Anemia: Related to revlimid and coumadin in setting of multiple myeloma. Current hemoglobin is 7.2 does not appear to be too far below her baseline hemoglobin. No obvious signs of bleeding. Will obtain an INR. We'll hold her Coumadin until INR results. Will  transfuse 2 units packed red blood cells.    Multiple myeloma: Under the care of dr. Whitney Muse. Most recent office note indicates she's required several transfusion because of anemia and total protein on the decline. Current therapy Revlimid/dax and 1 mg of Coumadin.     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 Current creatinine of 1.34 appears to be in the range of her baseline. Taking by mouth fluids well. Will monitor urine output    Hyperglycemia: Mild. Likely related to Decadron.     Code Status: full DVT Prophylaxis: Family Communication: daughter at bedside Disposition Plan: back to facility likely in am (indicate anticipated LOS)  Time spent: 65 minutes  [facility] Pager [phone]

## 2015-04-14 NOTE — Progress Notes (Signed)
Labs drawn

## 2015-04-14 NOTE — Progress Notes (Signed)
[place] [address] [place] [address]    DIAGNOSIS:  Multiple myeloma presenting with severe anemia IgG 10,600 mg/dl with total protein 13.4 g/dl 8.68 g/dl monoclonal protein IgG kappa Beta 2 microglobulin at 8.1 mg/L BMBX on 02/08/2015 with plasma cell neoplasm at 58% plasma cells   CURRENT THERAPY: Revlimid/1 mg coumadin/dexamethasone   INTERVAL HISTORY: [name] 81 y.o. female returns for additional follow-up of her myeloma.  She has had several transfusions because of anemia. Her total protein is declining from >12 to 9 g/dl today.  She is accompanied by her daughter and granddaughter today who speak mostly on her behalf.  She has had what they believe are allergies lately. She has had R neck pain and shortness of breath. She had shortness of breath yesterday. She needed a wheelchair to come to the cancer center today because she feels weak.   Her daughters say that she hasn't been eating much. They haven't had her since 2 weeks ago and she was doing really well then, "dancing around and eating well". They picked her up yesterday from the NH. Daughter mentioned the home hasn't been giving her her Calcium pill. She will complete her 21 day course of revlimid tomorrow.   MEDICAL HISTORY: Past Medical History  Diagnosis Date  . Chronic leg pain   . Poor historian   . Psychosis   . Schizoaffective disorder   . Hypertension   . High cholesterol   . Dementia   . Thrombocytopenia 11/10/2014  . Normocytic anemia 11/10/2014  . Elevated total protein 11/10/2014  . Multiple myeloma 02/17/2015  . Arthritis   . Osteopenia 03/31/2015    On Bone scan on 03/25/2015    has HEMORRHOIDS, INTERNAL; GERD; OTHER DYSPHAGIA; CERVICAL MUSCLE STRAIN; COLONIC POLYPS, ADENOMATOUS, HX OF; Dementia without behavioral disturbance; Altered mental status; Essential hypertension; Routine gynecological examination; Cervicitis; Vaginal bleeding; Schizoaffective  disorder, unspecified type; Vagina bleeding; Anemia; Normocytic anemia; Thrombocytopenia; Elevated total protein; Constipation; Multiple myeloma; Osteopenia; Dementia; CKD (chronic kidney disease); Hyperglycemia; and Generalized weakness on her problem list.     has No Known Allergies.     No current facility-administered medications on file prior to visit.   Current Outpatient Prescriptions on File Prior to Visit  Medication Sig Dispense Refill  . acetaminophen (TYLENOL) 500 MG tablet Take 500 mg by mouth 3 (three) times daily as needed for mild pain.     Marland Kitchen amLODipine (NORVASC) 5 MG tablet Take 1 tablet (5 mg total) by mouth daily.    . clonazePAM (KLONOPIN) 0.25 MG disintegrating tablet Take 0.25 mg by mouth at bedtime.    Marland Kitchen dexamethasone (DECADRON) 4 MG tablet Take 10 tablets (40 mg total) by mouth once a week. 40 tablet 1  . fentaNYL (DURAGESIC - DOSED MCG/HR) 12 MCG/HR Place 1 patch (12.5 mcg total) onto the skin every 3 (three) days. 5 patch 0  . ferrous sulfate 325 (65 FE) MG tablet Take 1 tablet (325 mg total) by mouth 3 (three) times daily with meals.    . fluticasone (FLONASE) 50 MCG/ACT nasal spray Place 2 sprays into both nostrils daily.    Marland Kitchen HYDROcodone-acetaminophen (NORCO/VICODIN) 5-325 MG per tablet Take 1 tablet by mouth every 4 (four) hours as needed (pain). (Patient taking differently: Take 1 tablet by mouth at bedtime as needed (pain). ) 20 tablet 0  . lenalidomide (REVLIMID) 10 MG capsule Take 1 capsule (10 mg total) by mouth daily. 21 capsule 0  .  magnesium hydroxide (MILK OF MAGNESIA) 400 MG/5ML suspension Take 30 mLs by mouth daily as needed (bowel mobility).     . Multiple Vitamins-Minerals (MULTIVITAMIN WITH MINERALS) tablet Take 1 tablet by mouth daily.    . ondansetron (ZOFRAN) 8 MG tablet Take 1 tablet (8 mg total) by mouth every 8 (eight) hours as needed for nausea or vomiting. 45 tablet 2  . polyethylene glycol (MIRALAX / GLYCOLAX) packet Take 17 g by mouth daily.     Marland Kitchen warfarin (COUMADIN) 1 MG tablet Take 1 tablet (1 mg total) by mouth daily. 30 tablet 3     SURGICAL HISTORY: Past Surgical History  Procedure Laterality Date  . Tubal ligation    . Foot surgery      SOCIAL HISTORY: History   Social History  . Marital Status: Widowed    Spouse Name: N/A  . Number of Children: N/A  . Years of Education: N/A   Occupational History  . Not on file.   Social History Main Topics  . Smoking status: Never Smoker   . Smokeless tobacco: Not on file  . Alcohol Use: No  . Drug Use: No  . Sexual Activity: Not Currently    Birth Control/ Protection: Post-menopausal   Other Topics Concern  . Not on file   Social History Narrative    FAMILY HISTORY: Family History  Problem Relation Age of Onset  . Diabetes Mother     Review of Systems  Constitutional: Positive for malaise/fatigue and nausea.  HENT: Negative.   Eyes: Negative.   Respiratory: Positive for shortness of breath.   Cardiovascular: Negative.   Gastrointestinal: Negative.   Genitourinary: Negative.  Musculoskeletal: Negative.   Skin: Negative.   Neurological: Negative.   Endo/Heme/Allergies: Negative.   Psychiatric/Behavioral: Positive for memory loss.    14 point review of systems was performed and is negative except as detailed under history of present illness and above   PHYSICAL EXAMINATION  ECOG PERFORMANCE STATUS: 1 - Symptomatic but completely ambulatory  Filed Vitals:   04/14/15 0900  BP: 115/51  Pulse: 86  Temp: 99.3 F (37.4 C)  Resp: 16    Physical Exam  Constitutional: She is oriented to person, place, and time and well-developed, well-nourished, and in no distress. Well groomed. In a wheelchair but able to get up and ambulate HENT:  Head: Normocephalic and atraumatic.  Nose: Nose normal.  Mouth/Throat: Oropharynx is clear and moist. No oropharyngeal exudate.  Eyes: Conjunctivae and EOM are normal. Pupils are equal, round, and reactive to light.  Right eye exhibits no discharge. Left eye exhibits no discharge. No scleral icterus.  Neck: Normal range of motion. Neck supple. No tracheal deviation present. No thyromegaly present.  Cardiovascular: Normal rate, regular rhythm and normal heart sounds.  Exam reveals no gallop and no friction rub.   No murmur heard. Pulmonary/Chest: Effort normal and breath sounds normal. She has no wheezes. She has no rales.  Abdominal: Soft. Bowel sounds are normal. She exhibits no distension and no mass. There is no rebound and no guarding.  Musculoskeletal: Normal range of motion. She exhibits no edema.  Lymphadenopathy:    She has no cervical adenopathy.  Neurological: She is alert and oriented to person, place, and time. She has normal reflexes. No cranial nerve deficit. Gait normal. Coordination normal.  Skin: Skin is warm and dry. No rash noted. Psychiatric: Mood, memory, affect and judgment normal.  Nursing note and vitals reviewed.   LABORATORY DATA: I reviewed the results below CBC  Component Value Date/Time   WBC 7.1 04/14/2015 0916   RBC 2.17* 04/14/2015 0916   RBC 2.54* 01/04/2015 1700   HGB 7.2* 04/14/2015 0916   HCT 20.8* 04/14/2015 0916   PLT 209 04/14/2015 0916   MCV 95.9 04/14/2015 0916   MCH 33.2 04/14/2015 0916   MCHC 34.6 04/14/2015 0916   RDW 15.3 04/14/2015 0916   LYMPHSABS 0.7 04/14/2015 0916   MONOABS 0.7 04/14/2015 0916   EOSABS 0.4 04/14/2015 0916   BASOSABS 0.1 04/14/2015 0916   CMP     Component Value Date/Time   NA 133* 04/14/2015 0916   K 3.8 04/14/2015 0916   CL 99* 04/14/2015 0916   CO2 26 04/14/2015 0916   GLUCOSE 147* 04/14/2015 0916   BUN 25* 04/14/2015 0916   CREATININE 1.34* 04/14/2015 0916   CALCIUM 7.7* 04/14/2015 0916   PROT 9.0* 04/14/2015 0916   PROT 9.3* 04/14/2015 0916   ALBUMIN 2.5* 04/14/2015 0916   ALBUMIN 2.5* 04/14/2015 0916   AST 23 04/14/2015 0916   AST 25 04/14/2015 0916   ALT 32 04/14/2015 0916   ALT 32 04/14/2015 0916    ALKPHOS 49 04/14/2015 0916   ALKPHOS 50 04/14/2015 0916   BILITOT 0.8 04/14/2015 0916   BILITOT 0.7 04/14/2015 0916   GFRNONAA 36* 04/14/2015 0916   GFRAA 42* 04/14/2015 0916     BMBX on 02/08/2015 58% plasma cells, kappa restricted BONE MARROW ASPIRATE: Paucispicular, but cellular. Erythroid precursors: Overall reduced. No significant dysplasia. Granulocytic precursors: Overall reduced. No significant dysplasia. No increase in blasts. Megakaryocytes: Present and morphologically unremarkable. Lymphocytes/plasma cells: There is a marked increase in plasma cells (58%) with atypical forms including large forms and prominent nucleoli. Lymphocytes are not increased. TOUCH PREPARATIONS: Similar to aspirate smears. CLOT and BIOPSY: The core biopsy is small, but hyper cellular for age (80%). There is a marked increase in plasma cells, including large clusters. There is admixed trilineage hematopoiesis. There are no atypical lymphoid aggregates. The clot section does not contain marrow tissue. IRON STAIN: Iron stains are performed on a bone marrow aspirate smear and section of clot. The controls stained appropriately. There are no particles on the aspirate or clot section for evaluation. Storage Iron: N/A. Ringed Sideroblasts: N/A. ADDITIONAL DATA / TESTING: Cytogenetics was ordered, including FISH. Per records the patient has a M-spike with IgG kappa (8.68 g/dL).  RADIOLOGY:  CLINICAL DATA: Multiple myeloma.  EXAM: METASTATIC BONE SURVEY  COMPARISON: Chest radiograph 11/09/2014. Head CT 06/01/2012. CT abdomen and pelvis 05/27/2012. Bilateral knee radiographs and pelvic radiograph 02/25/2012. Lumbar spine radiographs 10/14/2010.  FINDINGS: There is diffuse, generalized osteopenia. Small areas of slightly increased, rounded lucency are present in the shafts of multiple long bones, specifically the humeri and femora, without sizable discrete lytic lesions  identified.  Mild-to-moderate enlargement of the cardiac silhouette is unchanged. Calcification and tortuosity are noted of the thoracic aorta. Pulmonary vascular congestion on the prior chest radiograph has resolved. Left basilar opacity has also resolved. No evidence of acute airspace consolidation, edema, pleural effusion, or pneumothorax.  Multilevel cervical disc degeneration is present, worst at C3-4. Mild-to-moderate thoracic dextroscoliosis is present. Multilevel thoracic and lumbar spondylosis are noted. Minimal vertebral body height loss in the mid thoracic spine appears chronic and may be degenerative. Lower lumbar facet arthrosis is present. Grade 1 anterolisthesis of L4 on L5 is stable to slightly increased from 2011 lumbar spine radiographs.  There is mild enlargement of the diploic space of the skull which is similar to the prior head  CT without discrete lytic lesions identified.  Degenerative changes are noted involving the right greater than left glenohumeral joints. Moderate to severe osteoarthrosis is present involving the medial compartments of the left greater than right knees. Severe right moderate left hip osteoarthrosis are present.  IMPRESSION: 1. Generalized osteopenia with slightly heterogeneous lucency in the shafts of the humeri and femora. No discrete, sizable lytic lesions identified. 2. No acute fracture. 3. Advanced osteoarthrosis involving the shoulders, hips, and knees.   Electronically Signed  By: Logan Bores  On: 03/25/2015 14:13  ASSESSMENT and THERAPY PLAN:   Multiple myeloma, IgG kappa Profound anemia Dementia  She is on revlimid/dex and 1 mg coumadin. This regimen was chosen for tolerance and convenience as the patient depends upon her working daughter for transportation. She is doing well in regards to tolerance. Total protein is finally declining, myeloma labs are pending today.   SOB/Weakness  Most likely from her  anemia. Will set up for observation and transfusion today. She has been on her coumadin 1 mg daily per records. Given she is on Revlimid if her SOB does not improve with transfusion would have low index of suspicion to look for PE. No LE swelling or abnormality on exam today.  Anemia  Hemoglobin is 7.2  today. She will be admitted for transfusion given her complaints.  We will continue with current therapy. We will plan on bringing her and again next week to review laboratory results and recheck a CBC.  All questions were answered. The patient knows to call the clinic with any problems, questions or concerns. We can certainly see the patient much sooner if necessary. //  This note was signed electronically  This document serves as a record of services personally performed by Ancil Linsey, MD. It was created on her behalf by Arlyce Harman, a trained medical scribe. The creation of this record is based on the scribe's personal observations and the provider's statements to them. This document has been checked and approved by the attending provider.  I have reviewed the above documentation for accuracy and completeness, and I agree with the above. This note was electronically signed Kelby Fam. Khamiya Varin MD

## 2015-04-14 NOTE — Patient Instructions (Addendum)
[place] at [facility] Discharge Instructions  RECOMMENDATIONS MADE BY THE CONSULTANT AND ANY TEST RESULTS WILL BE SENT TO YOUR REFERRING PHYSICIAN.  Exam completed by Dr Whitney Muse Return in 2 weeks for follow up with the doctor and lab work Please call the clinic if you have any questions or concerns  Thank you for choosing [place] at [facility] to provide your oncology and hematology care.  To afford each patient quality time with our provider, please arrive at least 15 minutes before your scheduled appointment time.    You need to re-schedule your appointment should you arrive 10 or more minutes late.  We strive to give you quality time with our providers, and arriving late affects you and other patients whose appointments are after yours.  Also, if you no show three or more times for appointments you may be dismissed from the clinic at the providers discretion.     Again, thank you for choosing [facility].  Our hope is that these requests will decrease the amount of time that you wait before being seen by our physicians.       _____________________________________________________________  Should you have questions after your visit to [facility], please contact our office at [phone] between the hours of 8:30 a.m. and 4:30 p.m.  Voicemails left after 4:30 p.m. will not be returned until the following business day.  For prescription refill requests, have your pharmacy contact our office.

## 2015-04-14 NOTE — Progress Notes (Signed)
Per pt daughter, advanced directive/POA paperwork is on chart at the [facility].  Called to verify, stated that paperwork sent to scan in.  Will ask daughter to bring for [place] to copy and place on chart.

## 2015-04-14 NOTE — Progress Notes (Signed)
Report given to becky on 3rd floor pt will be going to rm 324. 1150 pt transported to 324

## 2015-04-14 NOTE — Progress Notes (Addendum)
Notified Dr Hilbert Bible of temp 101.6. Waiting to hear back from dr.   Dr Hilbert Bible called nurse back and ordered to continue with blood administration & administer Tylenol po. Will continue to monitor and re-check temp.  No further orders at this time. Bed remains in lowest position and call bell is within reach. Daughter is at bedside.

## 2015-04-15 ENCOUNTER — Other Ambulatory Visit (HOSPITAL_COMMUNITY): Payer: Self-pay | Admitting: Oncology

## 2015-04-15 ENCOUNTER — Emergency Department (HOSPITAL_COMMUNITY): Payer: Medicare Other

## 2015-04-15 ENCOUNTER — Emergency Department (HOSPITAL_COMMUNITY)
Admission: EM | Admit: 2015-04-15 | Discharge: 2015-04-16 | Disposition: A | Discharge status: Home / Self Care | Payer: Medicare Other | Attending: Emergency Medicine | Admitting: Emergency Medicine

## 2015-04-15 ENCOUNTER — Encounter (HOSPITAL_COMMUNITY): Payer: Self-pay

## 2015-04-15 DIAGNOSIS — Z7951 Long term (current) use of inhaled steroids: Secondary | ICD-10-CM | POA: Diagnosis not present

## 2015-04-15 DIAGNOSIS — Z79899 Other long term (current) drug therapy: Secondary | ICD-10-CM | POA: Diagnosis not present

## 2015-04-15 DIAGNOSIS — Z8579 Personal history of other malignant neoplasms of lymphoid, hematopoietic and related tissues: Secondary | ICD-10-CM | POA: Insufficient documentation

## 2015-04-15 DIAGNOSIS — D649 Anemia, unspecified: Secondary | ICD-10-CM | POA: Diagnosis not present

## 2015-04-15 DIAGNOSIS — R531 Weakness: Secondary | ICD-10-CM | POA: Diagnosis not present

## 2015-04-15 DIAGNOSIS — I10 Essential (primary) hypertension: Secondary | ICD-10-CM | POA: Diagnosis not present

## 2015-04-15 DIAGNOSIS — R61 Generalized hyperhidrosis: Secondary | ICD-10-CM | POA: Diagnosis present

## 2015-04-15 DIAGNOSIS — F0390 Unspecified dementia without behavioral disturbance: Secondary | ICD-10-CM | POA: Diagnosis not present

## 2015-04-15 DIAGNOSIS — N390 Urinary tract infection, site not specified: Secondary | ICD-10-CM | POA: Diagnosis not present

## 2015-04-15 DIAGNOSIS — Z8659 Personal history of other mental and behavioral disorders: Secondary | ICD-10-CM | POA: Insufficient documentation

## 2015-04-15 DIAGNOSIS — Z7901 Long term (current) use of anticoagulants: Secondary | ICD-10-CM | POA: Diagnosis not present

## 2015-04-15 DIAGNOSIS — Z792 Long term (current) use of antibiotics: Secondary | ICD-10-CM | POA: Diagnosis not present

## 2015-04-15 DIAGNOSIS — Z8639 Personal history of other endocrine, nutritional and metabolic disease: Secondary | ICD-10-CM | POA: Diagnosis not present

## 2015-04-15 DIAGNOSIS — G8929 Other chronic pain: Secondary | ICD-10-CM | POA: Diagnosis not present

## 2015-04-15 DIAGNOSIS — M1990 Unspecified osteoarthritis, unspecified site: Secondary | ICD-10-CM | POA: Diagnosis not present

## 2015-04-15 DIAGNOSIS — N183 Chronic kidney disease, stage 3 (moderate): Secondary | ICD-10-CM | POA: Diagnosis not present

## 2015-04-15 DIAGNOSIS — C9000 Multiple myeloma not having achieved remission: Secondary | ICD-10-CM

## 2015-04-15 DIAGNOSIS — R0602 Shortness of breath: Secondary | ICD-10-CM | POA: Diagnosis not present

## 2015-04-15 LAB — URINALYSIS, ROUTINE W REFLEX MICROSCOPIC
Bilirubin Urine: NEGATIVE
Glucose, UA: 100 mg/dL — AB
KETONES UR: NEGATIVE mg/dL
LEUKOCYTES UA: NEGATIVE
Nitrite: NEGATIVE
PH: 5 (ref 5.0–8.0)
Protein, ur: NEGATIVE mg/dL
SPECIFIC GRAVITY, URINE: 1.02 (ref 1.005–1.030)
Urobilinogen, UA: 0.2 mg/dL (ref 0.0–1.0)

## 2015-04-15 LAB — TYPE AND SCREEN
ABO/RH(D): O POS
Antibody Screen: NEGATIVE
UNIT DIVISION: 0
Unit division: 0

## 2015-04-15 LAB — CBC WITH DIFFERENTIAL/PLATELET
Basophils Absolute: 0 10*3/uL (ref 0.0–0.1)
Basophils Relative: 1 % (ref 0–1)
Eosinophils Absolute: 0 10*3/uL (ref 0.0–0.7)
Eosinophils Relative: 1 % (ref 0–5)
HCT: 27.7 % — ABNORMAL LOW (ref 36.0–46.0)
HEMOGLOBIN: 9.6 g/dL — AB (ref 12.0–15.0)
Lymphocytes Relative: 7 % — ABNORMAL LOW (ref 12–46)
Lymphs Abs: 0.4 10*3/uL — ABNORMAL LOW (ref 0.7–4.0)
MCH: 32.3 pg (ref 26.0–34.0)
MCHC: 34.7 g/dL (ref 30.0–36.0)
MCV: 93.3 fL (ref 78.0–100.0)
Monocytes Absolute: 0.1 10*3/uL (ref 0.1–1.0)
Monocytes Relative: 2 % — ABNORMAL LOW (ref 3–12)
Neutro Abs: 5.6 10*3/uL (ref 1.7–7.7)
Neutrophils Relative %: 91 % — ABNORMAL HIGH (ref 43–77)
PLATELETS: 223 10*3/uL (ref 150–400)
RBC: 2.97 MIL/uL — AB (ref 3.87–5.11)
RDW: 15.4 % (ref 11.5–15.5)
WBC: 6.2 10*3/uL (ref 4.0–10.5)

## 2015-04-15 LAB — CBC
HEMATOCRIT: 26.6 % — AB (ref 36.0–46.0)
Hemoglobin: 9.1 g/dL — ABNORMAL LOW (ref 12.0–15.0)
MCH: 31.8 pg (ref 26.0–34.0)
MCHC: 34.2 g/dL (ref 30.0–36.0)
MCV: 93 fL (ref 78.0–100.0)
PLATELETS: 195 10*3/uL (ref 150–400)
RBC: 2.86 MIL/uL — AB (ref 3.87–5.11)
RDW: 15.4 % (ref 11.5–15.5)
WBC: 6.3 10*3/uL (ref 4.0–10.5)

## 2015-04-15 LAB — COMPREHENSIVE METABOLIC PANEL
ALT: 38 U/L (ref 14–54)
AST: 22 U/L (ref 15–41)
Albumin: 2.5 g/dL — ABNORMAL LOW (ref 3.5–5.0)
Alkaline Phosphatase: 56 U/L (ref 38–126)
Anion gap: 8 (ref 5–15)
BUN: 24 mg/dL — AB (ref 6–20)
CALCIUM: 7.4 mg/dL — AB (ref 8.9–10.3)
CO2: 23 mmol/L (ref 22–32)
Chloride: 102 mmol/L (ref 101–111)
Creatinine, Ser: 1.11 mg/dL — ABNORMAL HIGH (ref 0.44–1.00)
GFR calc non Af Amer: 45 mL/min — ABNORMAL LOW (ref >60)
GFR, EST AFRICAN AMERICAN: 52 mL/min — AB (ref >60)
GLUCOSE: 188 mg/dL — AB (ref 65–99)
Potassium: 4.1 mmol/L (ref 3.5–5.1)
SODIUM: 133 mmol/L — AB (ref 135–145)
Total Bilirubin: 0.9 mg/dL (ref 0.3–1.2)
Total Protein: 9.4 g/dL — ABNORMAL HIGH (ref 6.5–8.1)

## 2015-04-15 LAB — URINE MICROSCOPIC-ADD ON

## 2015-04-15 LAB — BASIC METABOLIC PANEL
Anion gap: 6 (ref 5–15)
BUN: 20 mg/dL (ref 6–20)
CALCIUM: 7.1 mg/dL — AB (ref 8.9–10.3)
CHLORIDE: 102 mmol/L (ref 101–111)
CO2: 27 mmol/L (ref 22–32)
CREATININE: 1.16 mg/dL — AB (ref 0.44–1.00)
GFR calc Af Amer: 49 mL/min — ABNORMAL LOW (ref >60)
GFR, EST NON AFRICAN AMERICAN: 42 mL/min — AB (ref >60)
Glucose, Bld: 118 mg/dL — ABNORMAL HIGH (ref 65–99)
POTASSIUM: 3.4 mmol/L — AB (ref 3.5–5.1)
Sodium: 135 mmol/L (ref 135–145)

## 2015-04-15 LAB — CBG MONITORING, ED: GLUCOSE-CAPILLARY: 174 mg/dL — AB (ref 65–99)

## 2015-04-15 MED ORDER — FENTANYL 12 MCG/HR TD PT72: 12.5000 ug | MEDICATED_PATCH | TRANSDERMAL | Status: AC

## 2015-04-15 MED ORDER — ONDANSETRON HCL 8 MG PO TABS: 8.0000 mg | ORAL_TABLET | Freq: Three times a day (TID) | ORAL | Status: DC | PRN

## 2015-04-15 MED ORDER — HYDROCODONE-ACETAMINOPHEN 5-325 MG PO TABS: 1.0000 | ORAL_TABLET | Freq: Every evening | ORAL | Status: AC | PRN

## 2015-04-15 MED ORDER — DEXAMETHASONE 4 MG PO TABS: 40.0000 mg | ORAL_TABLET | ORAL | Status: DC

## 2015-04-15 MED ORDER — LENALIDOMIDE 10 MG PO CAPS
10.0000 mg | ORAL_CAPSULE | Freq: Every day | ORAL | Status: DC
Start: 2015-04-15 — End: 2015-05-19

## 2015-04-15 NOTE — ED Notes (Signed)
cbg of 174.

## 2015-04-15 NOTE — ED Notes (Signed)
Family member states patient was admitted at [facility] and received a blood transfusion and discharged home. Patients family states patient started to sweat "through two shirts" patient did not have her fever checked at home. Family member called PCP and was told "to come to ER to check for infection" per family.

## 2015-04-15 NOTE — Progress Notes (Signed)
Reviewed discharge instructions with pt and family, IV removed.  Information Packet for [facility] will be given to family at Discharge.

## 2015-04-15 NOTE — Discharge Summary (Signed)
Attestation signed by Janet Dike, MD at 04/15/2015 12:14 PM  Patient seen and examined. Note reviewed as per Janet Carrel, NP  Patient is feeling better today. She was transfused 2 unit prbc overnight with improvement in her hemoglobin and shortness of breath. No evidence of bleeding. Chest xray unremarkable. She did have a fever with transfusion, but no other signs of infection. Does not appear septic or toxic. She is ready for discharge and follow up with Oncology as an outpatient. May benefit from aranesp injections as an outpatient.  [name]     Expand All Collapse All   Physician Discharge Summary  [name] XBL:[id number] DOB: Aug 28, 1930 DOA: 04/14/2015  PCP: Janet Caprice, DO  Admit date: 04/14/2015 Discharge date: 04/15/2015  Time spent: 40 minutes  Recommendations for Outpatient Follow-up:  1. Has appointment with Dr [name] 04/30/15 for follow up 2. Back to facility  Discharge Diagnoses:  Active Problems:  Anemia  Multiple myeloma  Dementia  CKD (chronic kidney disease) stage 3, GFR 30-59 ml/min  Hyperglycemia  Generalized weakness   Discharge Condition: stable  Diet recommendation: heart healthy  Filed Weights   04/14/15 1237  Weight: 52.209 kg (115 lb 1.6 oz)    History of present illness:  [name] is a very pleasant 81 y.o. female with a history of, dementia, anemia, multiple myeloma, chronic leg pain presented to room 324 on 04/14/15 from oncology center chief complaint of anemia. She went to routine follow-up for her myeloma and had complaints of shortness of breath and generalized weakness. Initial evaluation revealed anemia.  Patient a resident of [facility] with mild dementia. Information obtained from the daughters at the bedside. sHe was in her usual state of health until the day and a half prior to admission, when she complained of shortness of breath demonstrated some generalized weakness. Associated symptoms included  nausea with one episode of vomiting. They denied coffee-ground emesis. No reported chest pain headache syncope or near-syncope. No fever chills diarrhea recent sick contacts. She'd been eating and drinking her normal amount. sHe does complain of chronic right neck and shoulder pain but this no worse than per her usual. Of note patient is on 1 mg of Coumadin daily but there has been no recent falls.  Workup revealed a hemoglobin of 7.2 hematocrit 20.8 sodium of 133 chloride 99 BUN 25 creatinine 1.34 serum glucose 147.  Hospital Course:  Anemia: Related to revlimid and coumadin in setting of multiple myeloma. Transfused 2 units PRBC's and hemoglobin 9.1 on discharge from 7.2 on admission. No obvious signs of bleeding. SOB resolved. Chest xray without acute abnormality. INR 1.35 at discharge. Will resume home meds at discharge. Has follow up appointment with Dr [name] 04/30/15.   Multiple myeloma: Under the care of dr. Whitney [name]. Follow up scheduled 04/30/15   Dementia: remained stable at baseline   CKD (chronic kidney disease): Stage II to 3. Remained stable  Hyperglycemia: Mild. Likely related to Decadron.  Procedures:  none  Consultations:  none  Discharge Exam: Filed Vitals:   04/15/15 0650  BP: 109/55  Pulse: 74  Temp: 98.3 F (36.8 C)  Resp: 16    General: well nourished appears comfortable Cardiovascular: RRR no MGR no LE edema Respiratory: normal effort BS clear but slightly distant in bases. No crackles no wheeze  Discharge Instructions   Discharge Instructions    Diet - low sodium heart healthy  Complete by: As directed      Increase activity slowly  Complete by: As  directed           Current Discharge Medication List    CONTINUE these medications which have CHANGED   Details  dexamethasone (DECADRON) 4 MG tablet Take 10 tablets (40 mg total) by mouth once a week.    fentaNYL (DURAGESIC - DOSED MCG/HR) 12 MCG/HR  Place 1 patch (12.5 mcg total) onto the skin every 3 (three) days.    HYDROcodone-acetaminophen (NORCO/VICODIN) 5-325 MG per tablet Take 1 tablet by mouth at bedtime as needed (pain). Qty: 20 tablet, Refills: 0    ondansetron (ZOFRAN) 8 MG tablet Take 1 tablet (8 mg total) by mouth every 8 (eight) hours as needed for nausea or vomiting.      CONTINUE these medications which have NOT CHANGED   Details  amLODipine (NORVASC) 5 MG tablet Take 1 tablet (5 mg total) by mouth daily.    clonazePAM (KLONOPIN) 0.25 MG disintegrating tablet Take 0.25 mg by mouth at bedtime.    ferrous sulfate 325 (65 FE) MG tablet Take 1 tablet (325 mg total) by mouth 3 (three) times daily with meals.    fluticasone (FLONASE) 50 MCG/ACT nasal spray Place 2 sprays into both nostrils daily.    lenalidomide (REVLIMID) 10 MG capsule Take 1 capsule (10 mg total) by mouth daily. Qty: 21 capsule, Refills: 0   Associated Diagnoses: Multiple myeloma    magnesium hydroxide (MILK OF MAGNESIA) 400 MG/5ML suspension Take 30 mLs by mouth daily as needed (bowel mobility).     Multiple Vitamins-Minerals (MULTIVITAMIN WITH MINERALS) tablet Take 1 tablet by mouth daily.    polyethylene glycol (MIRALAX / GLYCOLAX) packet Take 17 g by mouth daily.    warfarin (COUMADIN) 1 MG tablet Take 1 tablet (1 mg total) by mouth daily. Qty: 30 tablet, Refills: 3   Associated Diagnoses: Multiple myeloma    Wheat Dextrin (BENEFIBER DRINK MIX) PACK Take by mouth. Take 2 teaspoons by mouth everyday.   Associated Diagnoses: Multiple myeloma    acetaminophen (TYLENOL) 500 MG tablet Take 500 mg by mouth 3 (three) times daily as needed for mild pain.        No Known Allergies     The results of significant diagnostics from this hospitalization (including imaging, microbiology, ancillary and laboratory) are listed below for reference.    Significant Diagnostic Studies:  Imaging  Results    X-ray Chest Pa And Lateral  04/14/2015 CLINICAL DATA: Anemia, shortness of breath, weakness, nausea, vomiting. EXAM: CHEST 2 VIEW COMPARISON: 11/09/2014 FINDINGS: Cardiomegaly. Bibasilar atelectasis. No overt edema. No effusions. No acute bony abnormality. Degenerative changes in the spine and shoulders. IMPRESSION: Cardiomegaly, bibasilar atelectasis. Electronically Signed By: Rolm Baptise M.D. On: 04/14/2015 15:24   Dg Bone Survey Met  03/25/2015 CLINICAL DATA: Multiple myeloma. EXAM: METASTATIC BONE SURVEY COMPARISON: Chest radiograph 11/09/2014. Head CT 06/01/2012. CT abdomen and pelvis 05/27/2012. Bilateral knee radiographs and pelvic radiograph 02/25/2012. Lumbar spine radiographs 10/14/2010. FINDINGS: There is diffuse, generalized osteopenia. Small areas of slightly increased, rounded lucency are present in the shafts of multiple long bones, specifically the humeri and femora, without sizable discrete lytic lesions identified. Mild-to-moderate enlargement of the cardiac silhouette is unchanged. Calcification and tortuosity are noted of the thoracic aorta. Pulmonary vascular congestion on the prior chest radiograph has resolved. Left basilar opacity has also resolved. No evidence of acute airspace consolidation, edema, pleural effusion, or pneumothorax. Multilevel cervical disc degeneration is present, worst at C3-4. Mild-to-moderate thoracic dextroscoliosis is present. Multilevel thoracic and lumbar spondylosis are noted. Minimal vertebral body height  loss in the mid thoracic spine appears chronic and may be degenerative. Lower lumbar facet arthrosis is present. Grade 1 anterolisthesis of L4 on L5 is stable to slightly increased from 2011 lumbar spine radiographs. There is mild enlargement of the diploic space of the skull which is similar to the prior head CT without discrete lytic lesions identified. Degenerative changes are noted involving the right greater than left  glenohumeral joints. Moderate to severe osteoarthrosis is present involving the medial compartments of the left greater than right knees. Severe right moderate left hip osteoarthrosis are present. IMPRESSION: 1. Generalized osteopenia with slightly heterogeneous lucency in the shafts of the humeri and femora. No discrete, sizable lytic lesions identified. 2. No acute fracture. 3. Advanced osteoarthrosis involving the shoulders, hips, and knees. Electronically Signed By: Logan Bores On: 03/25/2015 14:13     Microbiology: No results found for this or any previous visit (from the past 240 hour(s)).   Labs: Basic Metabolic Panel:  Last Labs      Recent Labs Lab 04/14/15 0916 04/15/15 0618  NA 133* 135  K 3.8 3.4*  CL 99* 102  CO2 26 27  GLUCOSE 147* 118*  BUN 25* 20  CREATININE 1.34* 1.16*  CALCIUM 7.7* 7.1*     Liver Function Tests:  Last Labs      Recent Labs Lab 04/14/15 0916  AST 25  23  ALT 32  32  ALKPHOS 50  49  BILITOT 0.7  0.8  PROT 9.3*  9.0*  ALBUMIN 2.5*  2.5*      Last Labs     No results for input(s): LIPASE, AMYLASE in the last 168 hours.    Last Labs     No results for input(s): AMMONIA in the last 168 hours.   CBC:  Last Labs      Recent Labs Lab 04/14/15 0916 04/15/15 0618  WBC 7.1 6.3  NEUTROABS 5.2 --   HGB 7.2* 9.1*  HCT 20.8* 26.6*  MCV 95.9 93.0  PLT 209 195     Cardiac Enzymes:  Last Labs     No results for input(s): CKTOTAL, CKMB, CKMBINDEX, TROPONINI in the last 168 hours.   BNP: BNP (last 3 results)  Recent Labs (within last 365 days)    No results for input(s): BNP in the last 8760 hours.    ProBNP (last 3 results)  Recent Labs (within last 365 days)    No results for input(s): PROBNP in the last 8760 hours.    CBG:  Last Labs     No results for input(s): GLUCAP in the last 168 hours.       SignedRadene Gunning [facility] 04/15/2015, 10:55 AM

## 2015-04-15 NOTE — Clinical Social Work Note (Signed)
Clinical Social Work Assessment  Patient Details  Name: [name] MRN: [id number] Date of Birth: Jun 02, 1930  Date of referral:  04/15/15               Reason for consult:  Facility Placement                Permission sought to share information with:    Permission granted to share information::     Name::        Agency::     Relationship::     Contact Information:     Housing/Transportation Living arrangements for the past 2 months:  [place] of Information:  Adult Children, Other (Comment Required) (Daughter, Lloyd Huger. Granddaughter, Ross Ludwig.) Patient Interpreter Needed:  None Criminal Activity/Legal Involvement Pertinent to Current Situation/Hospitalization:  No - Comment as needed Significant Relationships:  Adult Children, Other(Comment) (Granddaughter, Ross Ludwig) Lives with:  Adult Children Do you feel safe going back to the place where you live?  Yes Need for family participation in patient care:  Yes (Comment)  Care giving concerns:  Patient resides in SNF.   Social Worker assessment / plan: Ms. Marcello Moores advised that patient has been a resident at [facility] in the memory care unit for the past three years.  She indicates that patient ambulates unassisted and that she receives assistance with ADL's from the facility. Ms. Marcello Moores indicated that patient spends the night with her typically once per week due to the facility not [place] of the county.  She stated that when patient has a doctor's appointment she picks patient up from the facility the day before and allows her to spend the night with her so that she can take patient to the appointment.  Ms. Marcello Moores discussed issues with patient's belongings becoming missing. CSW advised Ms. Thomas to meet with administrators at the facility to discuss her concerns.  Ms. Marcello Moores indicated that she was going to take patient home with her for the night due to an appointment and  that she would return patient to the facility tomorrow. CSW spoke with Gerald Stabs at [facility]. He confirmed family's report. CSW advised that patient was being discharged today and that she would be spending the night with patient's daughter and returning to the facility tomorrow.  Gerald Stabs was agreeable. CSW also advised that patient would not need an FL2 as she was not admitted for more than 24 hours.   CSW facilitated discharge.  CSW signing off.    Employment status:  Retired Forensic scientist:  Medicaid In [place] PT Recommendations:  Not assessed at this time Information / Referral to community resources:     Patient/Family's Response to care:  Family is agreeable for patient to return to SNF.  Patient/Family's Understanding of and Emotional Response to Diagnosis, Current Treatment, and Prognosis:  Family understands patient's diagnosis, treatment and prognosis.  Emotional Assessment Appearance:  Developmentally appropriate Attitude/Demeanor/Rapport:   (Cooperative and pleasant) Affect (typically observed):  Calm Orientation:  Oriented to Self, Oriented to Place Alcohol / Substance use:  Not Applicable Psych involvement (Current and /or in the community):  No (Comment)  Discharge Needs  Concerns to be addressed:  No discharge needs identified Readmission within the last 30 days:    Current discharge risk:  None Barriers to Discharge:  No Barriers Identified   Ihor Gully, LCSW 04/15/2015, 11:23 AM  [phone]

## 2015-04-15 NOTE — Care Management Note (Signed)
Case Management Note  Patient Details  Name: [name] MRN: [id number] Date of Birth: 16-Nov-1929  Subjective/Objective:                  Pt admitted from [facility] with anemia. Pt will return to the facility at discharge.  Action/Plan: Pt discharged back to facility today.  Expected Discharge Date:                  Expected Discharge Plan:  Skilled Nursing Facility  In-House Referral:  Clinical Social Work  Discharge planning Services  CM Consult  Post Acute Care Choice:  NA Choice offered to:  NA  DME Arranged:    DME Agency:     HH Arranged:    [place] Agency:     Status of Service:  Completed, signed off  Medicare Important Message Given:  No Date Medicare IM Given:    Medicare IM give by:    Date Additional Medicare IM Given:    Additional Medicare Important Message give by:     If discussed at [place] of Stay Meetings, dates discussed:    Additional Comments:  Joylene Draft, RN 04/15/2015, 11:02 AM

## 2015-04-15 NOTE — Progress Notes (Deleted)
Physician Discharge Summary  [name] [id number] DOB: [birth year] DOA: 04/14/2015  PCP: [name], DO  Admit date: 04/14/2015 Discharge date: 04/15/2015  Time spent: 40 minutes  Recommendations for Outpatient Follow-up:  1. Has appointment with Dr Whitney Muse 04/30/15 for follow up 2. Back to facility  Discharge Diagnoses:  Active Problems:   Anemia   Multiple myeloma   Dementia   CKD (chronic kidney disease) stage 3, GFR 30-59 ml/min   Hyperglycemia   Generalized weakness   Discharge Condition: stable  Diet recommendation: heart healthy  Filed Weights   04/14/15 1237  Weight: 52.209 kg (115 lb 1.6 oz)    History of present illness:  [name] is a very pleasant 81 y.o. female with a history of, dementia, anemia, multiple myeloma,  chronic leg pain presented to room 324 on 04/14/15 from oncology center chief complaint of anemia. She went to routine follow-up for her myeloma and had complaints of shortness of breath and generalized weakness. Initial evaluation revealed anemia.  Patient a resident of [facility] with mild dementia. Information obtained from the daughters at the bedside. sHe was in her usual state of health until the day and a half prior to admission,  when she complained of shortness of breath demonstrated some generalized weakness. Associated symptoms included nausea with one episode of vomiting. They denied coffee-ground emesis. No reported chest pain headache syncope or near-syncope. No fever chills diarrhea recent sick contacts. She'd been eating and drinking her normal amount. sHe does complain of chronic right neck and shoulder pain but this no worse than per her usual. Of note patient is on 1 mg of Coumadin daily but there has been no recent falls.  Workup revealed a hemoglobin of 7.2 hematocrit 20.8 sodium of 133 chloride 99 BUN 25 creatinine 1.34 serum glucose 147.  Hospital Course:  Anemia: Related to revlimid and coumadin in setting of multiple  myeloma. Transfused 2 units PRBC's and hemoglobin 9.1 on discharge from 7.2 on admission. No obvious signs of bleeding. SOB resolved. Chest xray without acute abnormality. INR 1.35 at discharge. Will resume home meds at discharge. Has follow up appointment with Dr Whitney Muse 04/30/15.   Multiple myeloma: Under the care of dr. Whitney Muse. Follow up scheduled 04/30/15   Dementia: remained stable at baseline   CKD (chronic kidney disease): Stage II to 3. Remained stable  Hyperglycemia: Mild. Likely related to Decadron.  Procedures:  none  Consultations:  none  Discharge Exam: Filed Vitals:   04/15/15 0650  BP: 109/55  Pulse: 74  Temp: 98.3 F (36.8 C)  Resp: 16    General: well nourished appears comfortable Cardiovascular: RRR no MGR no LE edema Respiratory: normal effort BS clear but slightly distant in bases. No crackles no wheeze  Discharge Instructions   Discharge Instructions    Diet - low sodium heart healthy    Complete by:  As directed      Increase activity slowly    Complete by:  As directed           Current Discharge Medication List    CONTINUE these medications which have CHANGED   Details  dexamethasone (DECADRON) 4 MG tablet Take 10 tablets (40 mg total) by mouth once a week.    fentaNYL (DURAGESIC - DOSED MCG/HR) 12 MCG/HR Place 1 patch (12.5 mcg total) onto the skin every 3 (three) days.    HYDROcodone-acetaminophen (NORCO/VICODIN) 5-325 MG per tablet Take 1 tablet by mouth at bedtime as needed (pain). Qty: 20 tablet,  Refills: 0    ondansetron (ZOFRAN) 8 MG tablet Take 1 tablet (8 mg total) by mouth every 8 (eight) hours as needed for nausea or vomiting.      CONTINUE these medications which have NOT CHANGED   Details  amLODipine (NORVASC) 5 MG tablet Take 1 tablet (5 mg total) by mouth daily.    clonazePAM (KLONOPIN) 0.25 MG disintegrating tablet Take 0.25 mg by mouth at bedtime.    ferrous sulfate 325 (65 FE) MG tablet Take 1 tablet (325 mg  total) by mouth 3 (three) times daily with meals.    fluticasone (FLONASE) 50 MCG/ACT nasal spray Place 2 sprays into both nostrils daily.    lenalidomide (REVLIMID) 10 MG capsule Take 1 capsule (10 mg total) by mouth daily. Qty: 21 capsule, Refills: 0   Associated Diagnoses: Multiple myeloma    magnesium hydroxide (MILK OF MAGNESIA) 400 MG/5ML suspension Take 30 mLs by mouth daily as needed (bowel mobility).     Multiple Vitamins-Minerals (MULTIVITAMIN WITH MINERALS) tablet Take 1 tablet by mouth daily.    polyethylene glycol (MIRALAX / GLYCOLAX) packet Take 17 g by mouth daily.    warfarin (COUMADIN) 1 MG tablet Take 1 tablet (1 mg total) by mouth daily. Qty: 30 tablet, Refills: 3   Associated Diagnoses: Multiple myeloma    Wheat Dextrin (BENEFIBER DRINK MIX) PACK Take by mouth. Take 2 teaspoons by mouth everyday.   Associated Diagnoses: Multiple myeloma    acetaminophen (TYLENOL) 500 MG tablet Take 500 mg by mouth 3 (three) times daily as needed for mild pain.        No Known Allergies    The results of significant diagnostics from this hospitalization (including imaging, microbiology, ancillary and laboratory) are listed below for reference.    Significant Diagnostic Studies: X-ray Chest Pa And Lateral  04/14/2015   CLINICAL DATA:  Anemia, shortness of breath, weakness, nausea, vomiting.  EXAM: CHEST  2 VIEW  COMPARISON:  11/09/2014  FINDINGS: Cardiomegaly. Bibasilar atelectasis. No overt edema. No effusions. No acute bony abnormality. Degenerative changes in the spine and shoulders.  IMPRESSION: Cardiomegaly, bibasilar atelectasis.   Electronically Signed   By: Rolm Baptise M.D.   On: 04/14/2015 15:24   Dg Bone Survey Met  03/25/2015   CLINICAL DATA:  Multiple myeloma.  EXAM: METASTATIC BONE SURVEY  COMPARISON:  Chest radiograph 11/09/2014. Head CT 06/01/2012. CT abdomen and pelvis 05/27/2012. Bilateral knee radiographs and pelvic radiograph 02/25/2012. Lumbar spine  radiographs 10/14/2010.  FINDINGS: There is diffuse, generalized osteopenia. Small areas of slightly increased, rounded lucency are present in the shafts of multiple long bones, specifically the humeri and femora, without sizable discrete lytic lesions identified.  Mild-to-moderate enlargement of the cardiac silhouette is unchanged. Calcification and tortuosity are noted of the thoracic aorta. Pulmonary vascular congestion on the prior chest radiograph has resolved. Left basilar opacity has also resolved. No evidence of acute airspace consolidation, edema, pleural effusion, or pneumothorax.  Multilevel cervical disc degeneration is present, worst at C3-4. Mild-to-moderate thoracic dextroscoliosis is present. Multilevel thoracic and lumbar spondylosis are noted. Minimal vertebral body height loss in the mid thoracic spine appears chronic and may be degenerative. Lower lumbar facet arthrosis is present. Grade 1 anterolisthesis of L4 on L5 is stable to slightly increased from 2011 lumbar spine radiographs.  There is mild enlargement of the diploic space of the skull which is similar to the prior head CT without discrete lytic lesions identified.  Degenerative changes are noted involving the right greater than left  glenohumeral joints. Moderate to severe osteoarthrosis is present involving the medial compartments of the left greater than right knees. Severe right moderate left hip osteoarthrosis are present.  IMPRESSION: 1. Generalized osteopenia with slightly heterogeneous lucency in the shafts of the humeri and femora. No discrete, sizable lytic lesions identified. 2. No acute fracture. 3. Advanced osteoarthrosis involving the shoulders, hips, and knees.   Electronically Signed   By: Logan Bores   On: 03/25/2015 14:13    Microbiology: No results found for this or any previous visit (from the past 240 hour(s)).   Labs: Basic Metabolic Panel:  Recent Labs Lab 04/14/15 0916 04/15/15 0618  NA 133* 135  K  3.8 3.4*  CL 99* 102  CO2 26 27  GLUCOSE 147* 118*  BUN 25* 20  CREATININE 1.34* 1.16*  CALCIUM 7.7* 7.1*   Liver Function Tests:  Recent Labs Lab 04/14/15 0916  AST 25  23  ALT 32  32  ALKPHOS 50  49  BILITOT 0.7  0.8  PROT 9.3*  9.0*  ALBUMIN 2.5*  2.5*   No results for input(s): LIPASE, AMYLASE in the last 168 hours. No results for input(s): AMMONIA in the last 168 hours. CBC:  Recent Labs Lab 04/14/15 0916 04/15/15 0618  WBC 7.1 6.3  NEUTROABS 5.2  --   HGB 7.2* 9.1*  HCT 20.8* 26.6*  MCV 95.9 93.0  PLT 209 195   Cardiac Enzymes: No results for input(s): CKTOTAL, CKMB, CKMBINDEX, TROPONINI in the last 168 hours. BNP: BNP (last 3 results) No results for input(s): BNP in the last 8760 hours.  ProBNP (last 3 results) No results for input(s): PROBNP in the last 8760 hours.  CBG: No results for input(s): GLUCAP in the last 168 hours.     SignedRadene Gunning  [facility] 04/15/2015, 10:55 AM

## 2015-04-16 LAB — IRON AND TIBC
Iron: 25 ug/dL — ABNORMAL LOW (ref 28–170)
Saturation Ratios: 24 % (ref 10.4–31.8)
TIBC: 104 ug/dL — ABNORMAL LOW (ref 250–450)
UIBC: 79 ug/dL

## 2015-04-16 LAB — VITAMIN B12: VITAMIN B 12: 682 pg/mL (ref 180–914)

## 2015-04-16 LAB — FERRITIN: Ferritin: 1380 ng/mL — ABNORMAL HIGH (ref 11–307)

## 2015-04-16 MED ORDER — CEPHALEXIN 500 MG PO CAPS
500.0000 mg | ORAL_CAPSULE | Freq: Once | ORAL | Status: AC
  Administered 2015-04-16: 500 mg via ORAL
  Filled 2015-04-16: qty 1

## 2015-04-16 MED ORDER — CEPHALEXIN 500 MG PO CAPS: 500.0000 mg | ORAL_CAPSULE | Freq: Two times a day (BID) | ORAL | Status: DC

## 2015-04-16 NOTE — Discharge Instructions (Signed)

## 2015-04-16 NOTE — ED Notes (Signed)
Patients family verbalize understanding of discharge instructions, prescription medications, home care and follow up care. Patient out of department at this time.

## 2015-04-16 NOTE — ED Provider Notes (Signed)
CSN: [id number]     Arrival date & time 04/15/15  2113 History   First MD Initiated Contact with Patient 04/15/15 2207     Chief Complaint  Patient presents with  . Night Sweats     (Consider location/radiation/quality/duration/timing/severity/associated sxs/prior Treatment) The history is provided by the patient and a relative (patients daughter (primary caregiver when not at her nursing center).).   [name] is a 81 y.o. female with a history of multiple myeloma, anemia, htn, and dementia presenting with a 24 hour history of fevers, not measured at home, but daughter states she is sweating at night completely soaking her pajamas last and had documented fevers up to 101.6 during her 24 hour admission (dc'd yesterday) for blood transfusions due to anemia.  Patient is a poor history giver, but denies any pain, nausea, headache or other complaints.  Daughter denies any vomiting, diarrhea, increased urinary frequency or confusion worsened than her norm.  She has had a reduced appetite in recent weeks, but not specifically worsened since the fever began.  She has been keeping her hydrated by offering lots of fluids which she is willing to take. She has not yet had her evening medicines and has had no anti pyretics today.    Past Medical History  Diagnosis Date  . Chronic leg pain   . Poor historian   . Psychosis   . Schizoaffective disorder   . Hypertension   . High cholesterol   . Dementia   . Thrombocytopenia 11/10/2014  . Normocytic anemia 11/10/2014  . Elevated total protein 11/10/2014  . Multiple myeloma 02/17/2015  . Arthritis   . Osteopenia 03/31/2015    On Bone scan on 03/25/2015   Past Surgical History  Procedure Laterality Date  . Tubal ligation    . Foot surgery     Family History  Problem Relation Age of Onset  . Diabetes Mother    History  Substance Use Topics  . Smoking status: Never Smoker   . Smokeless tobacco: Not on file  . Alcohol Use: No   OB History     No data available     Review of Systems  Unable to perform ROS: Dementia  Constitutional: Positive for fever.  HENT: Negative for sore throat.   Gastrointestinal: Negative for vomiting and diarrhea.  Skin: Negative for rash.      Allergies  Review of patient's allergies indicates no known allergies.  Home Medications   Prior to Admission medications   Medication Sig Start Date End Date Taking? Authorizing Provider  acetaminophen (TYLENOL) 500 MG tablet Take 500 mg by mouth 3 (three) times daily as needed for mild pain.    Yes Historical Provider, MD  amLODipine (NORVASC) 5 MG tablet Take 1 tablet (5 mg total) by mouth daily. 07/24/14  Yes Barton Dubois, MD  clonazePAM (KLONOPIN) 0.25 MG disintegrating tablet Take 0.25 mg by mouth at bedtime.   Yes Historical Provider, MD  dexamethasone (DECADRON) 4 MG tablet Take 10 tablets (40 mg total) by mouth once a week. 04/15/15  Yes Lezlie Octave Black, NP  fentaNYL (DURAGESIC - DOSED MCG/HR) 12 MCG/HR Place 1 patch (12.5 mcg total) onto the skin every 3 (three) days. 04/15/15  Yes Lezlie Octave Black, NP  ferrous sulfate 325 (65 FE) MG tablet Take 1 tablet (325 mg total) by mouth 3 (three) times daily with meals. 07/24/14  Yes Barton Dubois, MD  fluticasone [facility]) 50 MCG/ACT nasal spray Place 2 sprays into both nostrils daily.  Yes Historical Provider, MD  HYDROcodone-acetaminophen (NORCO/VICODIN) 5-325 MG per tablet Take 1 tablet by mouth at bedtime as needed (pain). 04/15/15  Yes Lezlie Octave Black, NP  lenalidomide (REVLIMID) 10 MG capsule Take 1 capsule (10 mg total) by mouth daily. 04/15/15  Yes Baird Cancer, PA-C  magnesium hydroxide (MILK OF MAGNESIA) 400 MG/5ML suspension Take 30 mLs by mouth daily as needed (bowel mobility).    Yes Historical Provider, MD  Multiple Vitamins-Minerals (MULTIVITAMIN WITH MINERALS) tablet Take 1 tablet by mouth daily.   Yes Historical Provider, MD  ondansetron (ZOFRAN) 8 MG tablet Take 1 tablet (8 mg total) by mouth every  8 (eight) hours as needed for nausea or vomiting. 04/15/15  Yes Lezlie Octave Black, NP  polyethylene glycol (MIRALAX / GLYCOLAX) packet Take 17 g by mouth daily.   Yes Historical Provider, MD  warfarin (COUMADIN) 1 MG tablet Take 1 tablet (1 mg total) by mouth daily. 02/17/15  Yes Thomas S Kefalas, PA-C  Wheat Dextrin (BENEFIBER DRINK MIX) PACK Take by mouth. Take 2 teaspoons by mouth everyday.   Yes Historical Provider, MD  cephALEXin (KEFLEX) 500 MG capsule Take 1 capsule (500 mg total) by mouth 2 (two) times daily. 04/16/15   Evalee Jefferson, PA-C   BP 118/72 mmHg  Pulse 83  Temp(Src) 97.4 F (36.3 C) (Oral)  Resp 18  Ht $R'5\' 2"'cQ$  (1.575 m)  Wt 117 lb (53.071 kg)  BMI 21.39 kg/m2  SpO2 98% Physical Exam  Constitutional: She appears well-developed and well-nourished. No distress.  HENT:  Head: Normocephalic and atraumatic.  Mouth/Throat: Oropharynx is clear and moist.  Eyes: Conjunctivae and EOM are normal. Pupils are equal, round, and reactive to light.  Neck: Normal range of motion.  Cardiovascular: Normal rate, regular rhythm, normal heart sounds and intact distal pulses.   Pulmonary/Chest: Effort normal and breath sounds normal. No respiratory distress. She has no wheezes.  Abdominal: Soft. Bowel sounds are normal. She exhibits no distension. There is no tenderness. There is no guarding.  Musculoskeletal: Normal range of motion.  Lymphadenopathy:    She has no cervical adenopathy.  Neurological: She is alert.  Skin: Skin is warm and dry. She is not diaphoretic.  Psychiatric: She has a normal mood and affect.  Nursing note and vitals reviewed.   ED Course  Procedures (including critical care time) Labs Review Labs Reviewed  CBC WITH DIFFERENTIAL/PLATELET - Abnormal; Notable for the following:    RBC 2.97 (*)    Hemoglobin 9.6 (*)    HCT 27.7 (*)    Neutrophils Relative % 91 (*)    Lymphocytes Relative 7 (*)    Lymphs Abs 0.4 (*)    Monocytes Relative 2 (*)    All other components  within normal limits  COMPREHENSIVE METABOLIC PANEL - Abnormal; Notable for the following:    Sodium 133 (*)    Glucose, Bld 188 (*)    BUN 24 (*)    Creatinine, Ser 1.11 (*)    Calcium 7.4 (*)    Total Protein 9.4 (*)    Albumin 2.5 (*)    GFR calc non Af Amer 45 (*)    GFR calc Af Amer 52 (*)    All other components within normal limits  URINALYSIS, ROUTINE W REFLEX MICROSCOPIC (NOT AT [facility]) - Abnormal; Notable for the following:    Glucose, UA 100 (*)    Hgb urine dipstick LARGE (*)    All other components within normal limits  URINE MICROSCOPIC-ADD ON -  Abnormal; Notable for the following:    Squamous Epithelial / LPF MANY (*)    Bacteria, UA MANY (*)    Casts GRANULAR CAST (*)    All other components within normal limits  CBG MONITORING, ED - Abnormal; Notable for the following:    Glucose-Capillary 174 (*)    All other components within normal limits  CULTURE, BLOOD (ROUTINE X 2)  URINE CULTURE  CULTURE, BLOOD (ROUTINE X 2)    Imaging Review Dg Chest 2 View  04/15/2015   CLINICAL DATA:  Night sweats.  EXAM: CHEST  2 VIEW  COMPARISON:  04/14/2015  FINDINGS: Improved lung aeration compared to prior exam. Cardiomegaly is unchanged allowing for differences in technique. Diminished bibasilar atelectasis. No consolidation to suggest pneumonia. No pleural effusion, pneumothorax, or pulmonary edema. Degenerative change in both shoulders.  IMPRESSION: Improved aeration with persistent but diminished bibasilar atelectasis. No evidence of pneumonia. Stable cardiomegaly.   Electronically Signed   By: Jeb Levering M.D.   On: 04/15/2015 23:41     EKG Interpretation None      MDM   Final diagnoses:  UTI (lower urinary tract infection)    Patients labs and/or radiological studies were reviewed and considered during the medical decision making and disposition process.  Results were also discussed with patient.  Urine cx sent.  Pt was placed on keflex pending cx, first dose  given here.  Pt discussed with Dr. Roderic Palau who had spoken to Dr. Whitney Muse who sent patient here for fever work up.  Attempt to contact Dr. Whitney Muse to advise of findings. Unable to contact.  Spoke with dg at length. Plan f/u with Dr. Whitney Muse for any persistent or worsened sx.  Blood cx also pending at this time.  Pt in no distress, nontoxic appearance, no indication for admission at this time.    Evalee Jefferson, PA-C 04/16/15 1536  Milton Ferguson, MD 04/19/15 [phone]

## 2015-04-17 LAB — URINE CULTURE
Colony Count: NO GROWTH
Culture: NO GROWTH

## 2015-04-20 LAB — CULTURE, BLOOD (ROUTINE X 2)
Culture: NO GROWTH
Culture: NO GROWTH

## 2015-04-21 ENCOUNTER — Encounter: Payer: Self-pay | Admitting: Nurse Practitioner

## 2015-04-21 ENCOUNTER — Ambulatory Visit (INDEPENDENT_AMBULATORY_CARE_PROVIDER_SITE_OTHER): Payer: Medicare Other | Admitting: Nurse Practitioner

## 2015-04-21 VITALS — BP 122/66 | HR 81 | Temp 98.4°F | Ht 62.0 in | Wt 98.4 lb

## 2015-04-21 DIAGNOSIS — D649 Anemia, unspecified: Secondary | ICD-10-CM | POA: Diagnosis not present

## 2015-04-21 DIAGNOSIS — K5900 Constipation, unspecified: Secondary | ICD-10-CM

## 2015-04-21 NOTE — Progress Notes (Signed)
Referring Provider: [name], DO Primary Care Physician:  [name], DO Primary GI:  Dr. Gala Romney  Chief Complaint  Patient presents with  . Follow-up    HPI:   81 year old female presents for follow-up on chronic anemia and constipation. Last office visit 12/08/2014 with chronic anemia and previous hospitalization thought likely to be hematology issue rather than GI bleed. At that time CBC, BMP, iron studies were ordered for further evaluation but the thought to consider pill esophagram versus EGD for possible dysphagia after lab for back. Also with complaints of constipation at that time with recommendation to add Benefiber daily and increase water intake and thought to consider another medication such as Amitiza her Linzess symptoms persisted. She is been followed by hematology/oncology including bone marrow biopsy and transfusions as needed. Oncology notes reviewed. On 01/31/2015 her hemoglobin was again as low as 6.6 and she received PRBCs. Bone marrow biopsy confirmed that the patient has myeloma. Treatment options are currently being discussed between oncology and the patient as well as the patient's family. Patient is dependent on family for medical visits due to history of dementia.  Today she states she's doing ok but is very tired all the time. Denies hematochezia, melena. Miralax gives her abdominal cramping. Benefiber seems to work well for her. She sees oncology again soon. Otherwise she's doing well, her main symptom is bloating, gas, and cramps with Miralax and her daughter states she's asked the facility to stop giving it to her, but they continue to do so. Denies chest pain, dyspnea, dizziness, lightheadedness, syncope, near syncope. Denies any other upper or lower GI symptoms.  Past Medical History  Diagnosis Date  . Chronic leg pain   . Poor historian   . Psychosis   . Schizoaffective disorder   . Hypertension   . High cholesterol   . Dementia   . Thrombocytopenia  11/10/2014  . Normocytic anemia 11/10/2014  . Elevated total protein 11/10/2014  . Multiple myeloma 02/17/2015  . Arthritis   . Osteopenia 03/31/2015    On Bone scan on 03/25/2015    Past Surgical History  Procedure Laterality Date  . Tubal ligation    . Foot surgery    . Colonoscopy  2010    RMR: 1. Normal rectum 2. Sigmoid and cecal polyp, status post snare resection described above. Remainder of the colonic mucosa appeared normal.     Current Outpatient Prescriptions  Medication Sig Dispense Refill  . acetaminophen (TYLENOL) 500 MG tablet Take 500 mg by mouth 3 (three) times daily as needed for mild pain.     Marland Kitchen amLODipine (NORVASC) 5 MG tablet Take 1 tablet (5 mg total) by mouth daily.    . calcium-vitamin D (OSCAL WITH D) 500-200 MG-UNIT per tablet Take 1 tablet by mouth 2 (two) times daily.    . calcium-vitamin D 250-100 MG-UNIT per tablet Take 1 tablet by mouth 2 (two) times daily.    . cephALEXin (KEFLEX) 500 MG capsule Take 1 capsule (500 mg total) by mouth 2 (two) times daily. 10 capsule 0  . clonazePAM (KLONOPIN) 0.25 MG disintegrating tablet Take 0.25 mg by mouth at bedtime.    Marland Kitchen dexamethasone (DECADRON) 4 MG tablet Take 10 tablets (40 mg total) by mouth once a week.    . fentaNYL (DURAGESIC - DOSED MCG/HR) 12 MCG/HR Place 1 patch (12.5 mcg total) onto the skin every 3 (three) days.    . ferrous sulfate 325 (65 FE) MG tablet Take 1 tablet (325 mg  total) by mouth 3 (three) times daily with meals.    . fluticasone (FLONASE) 50 MCG/ACT nasal spray Place 2 sprays into both nostrils daily.    Marland Kitchen HYDROcodone-acetaminophen (NORCO/VICODIN) 5-325 MG per tablet Take 1 tablet by mouth at bedtime as needed (pain). 20 tablet 0  . lenalidomide (REVLIMID) 10 MG capsule Take 1 capsule (10 mg total) by mouth daily. 21 capsule 0  . loratadine (CLARITIN) 10 MG tablet Take 10 mg by mouth daily.    . magnesium hydroxide (MILK OF MAGNESIA) 400 MG/5ML suspension Take 30 mLs by mouth daily as needed  (bowel mobility).     . Melatonin 3 MG CAPS Take 1 capsule by mouth daily.    . Multiple Vitamins-Minerals (MULTIVITAMIN WITH MINERALS) tablet Take 1 tablet by mouth daily.    . ondansetron (ZOFRAN) 8 MG tablet Take 1 tablet (8 mg total) by mouth every 8 (eight) hours as needed for nausea or vomiting.    . polyethylene glycol (MIRALAX / GLYCOLAX) packet Take 17 g by mouth daily.    Marland Kitchen warfarin (COUMADIN) 1 MG tablet Take 1 tablet (1 mg total) by mouth daily. 30 tablet 3  . Wheat Dextrin (BENEFIBER DRINK MIX) PACK Take by mouth. Take 2 teaspoons by mouth everyday.     No current facility-administered medications for this visit.    Allergies as of 04/21/2015  . (No Known Allergies)    Family History  Problem Relation Age of Onset  . Diabetes Mother     History   Social History  . Marital Status: Widowed    Spouse Name: N/A  . Number of Children: N/A  . Years of Education: N/A   Social History Main Topics  . Smoking status: Never Smoker   . Smokeless tobacco: Not on file  . Alcohol Use: No  . Drug Use: No  . Sexual Activity: Not Currently    Birth Control/ Protection: Post-menopausal   Other Topics Concern  . None   Social History Narrative    Review of Systems: General: Negative for anorexia, weight loss, fever, chills. Admits fatigue and weakness. Eyes: Negative for vision changes.  CV: Negative for chest pain, angina, palpitations, peripheral edema.  Respiratory: Negative for dyspnea at rest, cough, wheezing.  GI: See history of present illness. Endo: Negative for unusual weight change.  Heme: Negative for bruising or bleeding. Allergy: Negative for rash or hives.   Physical Exam: BP 122/66 mmHg  Pulse 81  Temp(Src) 98.4 F (36.9 C)  Ht $R'5\' 2"'VK$  (1.575 m)  Wt 98 lb 6.4 oz (44.634 kg)  BMI 17.99 kg/m2 General:   Alert and oriented. Pleasant and cooperative. Well-nourished and well-developed. Seems fatigued and tired, but alert. Head:  Normocephalic and  atraumatic. Eyes:  Without icterus, sclera clear and conjunctiva pink.  Ears:  Normal auditory acuity. Cardiovascular:  S1, S2 present without murmurs appreciated. Extremities without clubbing or edema. Respiratory:  Clear to auscultation bilaterally. No wheezes, rales, or rhonchi. No distress.  Gastrointestinal:  +BS, soft, non-tender and non-distended. No HSM noted. No guarding or rebound. No masses appreciated.  Rectal:  Deferred  Neurologic:  Alert and oriented x4;  grossly normal neurologically. Psych:  Alert and cooperative. Normal mood and affect. Heme/Lymph/Immune: No excessive bruising noted.    04/21/2015 1:46 PM

## 2015-04-21 NOTE — Patient Instructions (Signed)
1. Continue your visit with oncology/hematology for your myeloma.  2. Hold the MiraLAX for now. 3. Continue with Benefiber 2 teaspoons twice daily.  4. No need to follow up routinely, return as needed for any new symptoms or worsening symptoms.

## 2015-04-27 NOTE — Assessment & Plan Note (Signed)
Patient with significant, symptomatic anemia. She is transfusion dependent. Her last hemoglobin was 6.6. She has been seen by oncology and diagnosed with myeloma. They're currently discussing treatment options. She has follow-up scheduled with oncology. At this point there is no further GI evaluation to be completed for her anemia as it has been determined to be a hematological issue. We will have her return for follow-up as needed for any other GI symptoms.

## 2015-04-27 NOTE — Assessment & Plan Note (Signed)
Constipation symptoms are improved with Benefiber and MiraLAX. Patient and daughter state that symptoms had improved with Benefiber alone, but the facility has added MiraLAX. Unfortunately, the MiraLAX causes untoward side effects such as bloating and gas as well as abdominal cramping. There was seen to discontinue the MiraLAX, but the facility will not do so. At this point, if her symptoms remain well controlled on Benefiber she can stop the MiraLAX. If her constipation symptoms return, she can be referred back to [place] for further evaluation and management. We will have her return as needed for symptoms.

## 2015-04-28 NOTE — Progress Notes (Signed)
cc'd to pcp 

## 2015-04-30 ENCOUNTER — Encounter (HOSPITAL_BASED_OUTPATIENT_CLINIC_OR_DEPARTMENT_OTHER): Payer: Medicare Other

## 2015-04-30 ENCOUNTER — Encounter (HOSPITAL_BASED_OUTPATIENT_CLINIC_OR_DEPARTMENT_OTHER): Payer: Medicare Other | Admitting: Hematology & Oncology

## 2015-04-30 ENCOUNTER — Encounter (HOSPITAL_COMMUNITY): Payer: Self-pay | Admitting: Hematology & Oncology

## 2015-04-30 VITALS — BP 126/70 | HR 71 | Temp 99.1°F | Resp 16 | Wt 120.8 lb

## 2015-04-30 DIAGNOSIS — D649 Anemia, unspecified: Secondary | ICD-10-CM | POA: Diagnosis not present

## 2015-04-30 DIAGNOSIS — C9000 Multiple myeloma not having achieved remission: Secondary | ICD-10-CM | POA: Diagnosis present

## 2015-04-30 DIAGNOSIS — M899 Disorder of bone, unspecified: Secondary | ICD-10-CM

## 2015-04-30 DIAGNOSIS — R5383 Other fatigue: Secondary | ICD-10-CM | POA: Diagnosis not present

## 2015-04-30 DIAGNOSIS — M8580 Other specified disorders of bone density and structure, unspecified site: Secondary | ICD-10-CM | POA: Diagnosis not present

## 2015-04-30 DIAGNOSIS — F0390 Unspecified dementia without behavioral disturbance: Secondary | ICD-10-CM

## 2015-04-30 LAB — COMPREHENSIVE METABOLIC PANEL
ALT: 19 U/L (ref 14–54)
AST: 21 U/L (ref 15–41)
Albumin: 2.8 g/dL — ABNORMAL LOW (ref 3.5–5.0)
Alkaline Phosphatase: 61 U/L (ref 38–126)
Anion gap: 7 (ref 5–15)
BUN: 23 mg/dL — AB (ref 6–20)
CO2: 30 mmol/L (ref 22–32)
CREATININE: 1.15 mg/dL — AB (ref 0.44–1.00)
Calcium: 8.5 mg/dL — ABNORMAL LOW (ref 8.9–10.3)
Chloride: 99 mmol/L — ABNORMAL LOW (ref 101–111)
GFR calc Af Amer: 50 mL/min — ABNORMAL LOW (ref >60)
GFR, EST NON AFRICAN AMERICAN: 43 mL/min — AB (ref >60)
GLUCOSE: 174 mg/dL — AB (ref 65–99)
Potassium: 3.3 mmol/L — ABNORMAL LOW (ref 3.5–5.1)
Sodium: 136 mmol/L (ref 135–145)
Total Bilirubin: 0.5 mg/dL (ref 0.3–1.2)
Total Protein: 8.8 g/dL — ABNORMAL HIGH (ref 6.5–8.1)

## 2015-04-30 LAB — CBC WITH DIFFERENTIAL/PLATELET
Basophils Absolute: 0 10*3/uL (ref 0.0–0.1)
Basophils Relative: 1 % (ref 0–1)
EOS PCT: 3 % (ref 0–5)
Eosinophils Absolute: 0.2 10*3/uL (ref 0.0–0.7)
HCT: 26.7 % — ABNORMAL LOW (ref 36.0–46.0)
Hemoglobin: 9.1 g/dL — ABNORMAL LOW (ref 12.0–15.0)
Lymphocytes Relative: 17 % (ref 12–46)
Lymphs Abs: 1.2 10*3/uL (ref 0.7–4.0)
MCH: 33.1 pg (ref 26.0–34.0)
MCHC: 34.1 g/dL (ref 30.0–36.0)
MCV: 97.1 fL (ref 78.0–100.0)
Monocytes Absolute: 0.2 10*3/uL (ref 0.1–1.0)
Monocytes Relative: 2 % — ABNORMAL LOW (ref 3–12)
Neutro Abs: 5.6 10*3/uL (ref 1.7–7.7)
Neutrophils Relative %: 77 % (ref 43–77)
PLATELETS: 163 10*3/uL (ref 150–400)
RBC: 2.75 MIL/uL — ABNORMAL LOW (ref 3.87–5.11)
RDW: 15.8 % — AB (ref 11.5–15.5)
WBC: 7.3 10*3/uL (ref 4.0–10.5)

## 2015-04-30 LAB — FERRITIN: Ferritin: 1028 ng/mL — ABNORMAL HIGH (ref 11–307)

## 2015-04-30 NOTE — Progress Notes (Signed)
Lab draw

## 2015-04-30 NOTE — Progress Notes (Signed)
[place] [address] [place]  [address]    DIAGNOSIS:  Multiple myeloma presenting with severe anemia IgG 10,600 mg/dl with total protein 13.4 g/dl 8.68 g/dl monoclonal protein IgG kappa Beta 2 microglobulin at 8.1 mg/L BMBX on 02/08/2015 with plasma cell neoplasm at 58% plasma cells Bone survey on 03/25/2015 with generalized osteopenia with slightly heterogeneous lucency in the shafts of the humeri and femora. No discrete sizable lytic lesions identified advanced osteoarthritis   CURRENT THERAPY: Revlimid/1 mg coumadin/dexamethasone   INTERVAL HISTORY: [name] 81 y.o. female returns for additional follow-up of her myeloma.  She has had several transfusions because of anemia. Her total protein is declining from >12 to 8.8 g/dl today.  She is accompanied by her daughter and granddaughter today who speak mostly on her behalf.  She has had some ups and downs in regards to her health recently. But her daughter notes she has been doing well for the last week or 2. She states she is back to her normal baseline. The patient does have dementia.  Her daughter mentions that her mom is very constipated even after being given Miralax. [name] tells her daughter that 'she hurts back there'.She is eating well and mentions that she is hungry now. She complains to her daughter of sweating at night. She has no documented fever, cough, or other major complaints. Chest x-ray were last performed on June 1 that showed cardiomegaly and bibasilar atelectasis.  MEDICAL HISTORY: Past Medical History  Diagnosis Date  . Chronic leg pain   . Poor historian   . Psychosis   . Schizoaffective disorder   . Hypertension   . High cholesterol   . Dementia   . Thrombocytopenia 11/10/2014  . Normocytic anemia 11/10/2014  . Elevated total protein 11/10/2014  . Multiple myeloma 02/17/2015  . Arthritis   . Osteopenia 03/31/2015    On Bone scan on 03/25/2015    has  HEMORRHOIDS, INTERNAL; GERD; OTHER DYSPHAGIA; CERVICAL MUSCLE STRAIN; COLONIC POLYPS, ADENOMATOUS, HX OF; Dementia without behavioral disturbance; Altered mental status; Essential hypertension; Routine gynecological examination; Cervicitis; Vaginal bleeding; Schizoaffective disorder, unspecified type; Vagina bleeding; Anemia; Normocytic anemia; Thrombocytopenia; Elevated total protein; Constipation; Multiple myeloma; Osteopenia; Dementia; CKD (chronic kidney disease) stage 3, GFR 30-59 ml/min; Hyperglycemia; and Generalized weakness on her problem list.     has No Known Allergies.     Current Outpatient Prescriptions on File Prior to Visit  Medication Sig Dispense Refill  . acetaminophen (TYLENOL) 500 MG tablet Take 500 mg by mouth 3 (three) times daily as needed for mild pain.     Marland Kitchen amLODipine (NORVASC) 5 MG tablet Take 1 tablet (5 mg total) by mouth daily.    . calcium-vitamin D (OSCAL WITH D) 500-200 MG-UNIT per tablet Take 1 tablet by mouth 2 (two) times daily.    . calcium-vitamin D 250-100 MG-UNIT per tablet Take 1 tablet by mouth 2 (two) times daily.    . clonazePAM (KLONOPIN) 0.25 MG disintegrating tablet Take 0.25 mg by mouth at bedtime.    Marland Kitchen dexamethasone (DECADRON) 4 MG tablet Take 10 tablets (40 mg total) by mouth once a week.    . fentaNYL (DURAGESIC - DOSED MCG/HR) 12 MCG/HR Place 1 patch (12.5 mcg total) onto the skin every 3 (three) days.    . ferrous sulfate 325 (65 FE) MG tablet Take 1 tablet (325 mg total) by mouth 3 (three) times daily with meals.    . fluticasone (FLONASE) 50 MCG/ACT  nasal spray Place 2 sprays into both nostrils daily.    Marland Kitchen HYDROcodone-acetaminophen (NORCO/VICODIN) 5-325 MG per tablet Take 1 tablet by mouth at bedtime as needed (pain). 20 tablet 0  . lenalidomide (REVLIMID) 10 MG capsule Take 1 capsule (10 mg total) by mouth daily. 21 capsule 0  . loratadine (CLARITIN) 10 MG tablet Take 10 mg by mouth daily.    . magnesium hydroxide (MILK OF MAGNESIA) 400  MG/5ML suspension Take 30 mLs by mouth daily as needed (bowel mobility).     . Melatonin 3 MG CAPS Take 1 capsule by mouth daily.    . Multiple Vitamins-Minerals (MULTIVITAMIN WITH MINERALS) tablet Take 1 tablet by mouth daily.    . ondansetron (ZOFRAN) 8 MG tablet Take 1 tablet (8 mg total) by mouth every 8 (eight) hours as needed for nausea or vomiting.    . polyethylene glycol (MIRALAX / GLYCOLAX) packet Take 17 g by mouth daily.    Marland Kitchen warfarin (COUMADIN) 1 MG tablet Take 1 tablet (1 mg total) by mouth daily. 30 tablet 3  . Wheat Dextrin (BENEFIBER DRINK MIX) PACK Take by mouth. Take 2 teaspoons by mouth everyday.    . cephALEXin (KEFLEX) 500 MG capsule Take 1 capsule (500 mg total) by mouth 2 (two) times daily. (Patient not taking: Reported on 04/30/2015) 10 capsule 0   No current facility-administered medications on file prior to visit.     SURGICAL HISTORY: Past Surgical History  Procedure Laterality Date  . Tubal ligation    . Foot surgery    . Colonoscopy  2010    RMR: 1. Normal rectum 2. Sigmoid and cecal polyp, status post snare resection described above. Remainder of the colonic mucosa appeared normal.     SOCIAL HISTORY: History   Social History  . Marital Status: Widowed    Spouse Name: N/A  . Number of Children: N/A  . Years of Education: N/A   Occupational History  . Not on file.   Social History Main Topics  . Smoking status: Never Smoker   . Smokeless tobacco: Not on file  . Alcohol Use: No  . Drug Use: No  . Sexual Activity: Not Currently    Birth Control/ Protection: Post-menopausal   Other Topics Concern  . Not on file   Social History Narrative    FAMILY HISTORY: Family History  Problem Relation Age of Onset  . Diabetes Mother     Review of Systems  Constitutional: Negative HENT: Negative.   Eyes: Negative.   Respiratory:   Negative. Cardiovascular: Negative.   Gastrointestinal: Positive for constipation.   Genitourinary: Negative.    Musculoskeletal: Negative.   Skin: Negative.   Neurological: Negative.   Endo/Heme/Allergies: Negative.   Psychiatric/Behavioral: Positive for memory loss.    14 point review of systems was performed and is negative except as detailed under history of present illness and above   PHYSICAL EXAMINATION  ECOG PERFORMANCE STATUS: 1 - Symptomatic but completely ambulatory  Filed Vitals:   04/30/15 1008  BP: 126/70  Pulse: 71  Temp: 99.1 F (37.3 C)  Resp: 16    Physical Exam  Constitutional: She is oriented to person, place, and time and well-developed, well-nourished, and in no distress. Well groomed. Gets onto the exam table today with no assistance. HENT:  Head: Normocephalic and atraumatic.  Nose: Nose normal.  Mouth/Throat: Oropharynx is clear and moist. No oropharyngeal exudate.  Eyes: Conjunctivae and EOM are normal. Pupils are equal, round, and reactive to light. Right eye  exhibits no discharge. Left eye exhibits no discharge. No scleral icterus.  Neck: Normal range of motion. Neck supple. No tracheal deviation present. No thyromegaly present.  Cardiovascular: Normal rate, regular rhythm and normal heart sounds.  Exam reveals no gallop and no friction rub.   No murmur heard. Pulmonary/Chest: Effort normal and breath sounds normal. She has no wheezes. She has no rales.  Abdominal: Soft. Bowel sounds are normal. She exhibits no distension and no mass. There is no rebound and no guarding.  Musculoskeletal: Normal range of motion. She exhibits no edema.  Lymphadenopathy:    She has no cervical adenopathy.  Neurological: She is alert and oriented to person. She has normal reflexes. No cranial nerve deficit. Gait normal. Coordination normal.  Skin: Skin is warm and dry. No rash noted. Psychiatric: Mood normal.  Nursing note and vitals reviewed.   LABORATORY DATA: I reviewed the results below   Results for KATHE, WIRICK (MRN [id number])   Ref. Range 04/30/2015 10:00  IgG  (Immunoglobin G), Serum Latest Ref Range: [phone] mg/dL 4515 (H)  IgA Latest Ref Range: 64-422 mg/dL 52 (L)  IgM, Serum Latest Ref Range: 26-217 mg/dL <6 (L)  Kappa free light chain Latest Ref Range: 3.30-19.40 mg/L 100.84 (H)  Lamda free light chains Latest Ref Range: 5.71-26.30 mg/L 14.86  Kappa, lamda light chain ratio Latest Ref Range: 0.26-1.65  6.79 (H)  WBC Latest Ref Range: 4.0-10.5 K/uL 7.3  RBC Latest Ref Range: 3.87-5.11 MIL/uL 2.75 (L)  Hemoglobin Latest Ref Range: 12.0-15.0 g/dL 9.1 (L)  HCT Latest Ref Range: 36.0-46.0 % 26.7 (L)  MCV Latest Ref Range: 78.0-100.0 fL 97.1  MCH Latest Ref Range: 26.0-34.0 pg 33.1  MCHC Latest Ref Range: 30.0-36.0 g/dL 34.1  RDW Latest Ref Range: 11.5-15.5 % 15.8 (H)  Platelets Latest Ref Range: 150-400 K/uL 163  Neutrophils Latest Ref Range: 43-77 % 77  Lymphocytes Latest Ref Range: 12-46 % 17  Monocytes Relative Latest Ref Range: 3-12 % 2 (L)  Eosinophil Latest Ref Range: 0-5 % 3  Basophil Latest Ref Range: 0-1 % 1  NEUT# Latest Ref Range: 1.7-7.7 K/uL 5.6  Lymphocyte # Latest Ref Range: 0.7-4.0 K/uL 1.2  Monocyte # Latest Ref Range: 0.1-1.0 K/uL 0.2  Eosinophils Absolute Latest Ref Range: 0.0-0.7 K/uL 0.2  Basophils Absolute Latest Ref Range: 0.0-0.1 K/uL 0.0  Glucose Latest Ref Range: 65-99 mg/dL 174 (H)    CMP     Component Value Date/Time   NA 136 04/30/2015 1000   K 3.3* 04/30/2015 1000   CL 99* 04/30/2015 1000   CO2 30 04/30/2015 1000   GLUCOSE 174* 04/30/2015 1000   BUN 23* 04/30/2015 1000   CREATININE 1.15* 04/30/2015 1000   CALCIUM 8.5* 04/30/2015 1000   PROT 8.8* 04/30/2015 1000   ALBUMIN 2.8* 04/30/2015 1000   AST 21 04/30/2015 1000   ALT 19 04/30/2015 1000   ALKPHOS 61 04/30/2015 1000   BILITOT 0.5 04/30/2015 1000   GFRNONAA 43* 04/30/2015 1000   GFRAA 50* 04/30/2015 1000     BMBX on 02/08/2015 58% plasma cells, kappa restricted BONE MARROW ASPIRATE: Paucispicular, but cellular. Erythroid precursors:  Overall reduced. No significant dysplasia. Granulocytic precursors: Overall reduced. No significant dysplasia. No increase in blasts. Megakaryocytes: Present and morphologically unremarkable. Lymphocytes/plasma cells: There is a marked increase in plasma cells (58%) with atypical forms including large forms and prominent nucleoli. Lymphocytes are not increased. TOUCH PREPARATIONS: Similar to aspirate smears. CLOT and BIOPSY: The core biopsy is small, but hyper cellular for  age (80%). There is a marked increase in plasma cells, including large clusters. There is admixed trilineage hematopoiesis. There are no atypical lymphoid aggregates. The clot section does not contain marrow tissue. IRON STAIN: Iron stains are performed on a bone marrow aspirate smear and section of clot. The controls stained appropriately. There are no particles on the aspirate or clot section for evaluation. Storage Iron: N/A. Ringed Sideroblasts: N/A. ADDITIONAL DATA / TESTING: Cytogenetics was ordered, including FISH. Per records the patient has a M-spike with IgG kappa (8.68 g/dL).  RADIOLOGY:  CLINICAL DATA: Multiple myeloma.  EXAM: METASTATIC BONE SURVEY  COMPARISON: Chest radiograph 11/09/2014. Head CT 06/01/2012. CT abdomen and pelvis 05/27/2012. Bilateral knee radiographs and pelvic radiograph 02/25/2012. Lumbar spine radiographs 10/14/2010.  FINDINGS: There is diffuse, generalized osteopenia. Small areas of slightly increased, rounded lucency are present in the shafts of multiple long bones, specifically the humeri and femora, without sizable discrete lytic lesions identified.  Mild-to-moderate enlargement of the cardiac silhouette is unchanged. Calcification and tortuosity are noted of the thoracic aorta. Pulmonary vascular congestion on the prior chest radiograph has resolved. Left basilar opacity has also resolved. No evidence of acute airspace consolidation, edema, pleural effusion,  or pneumothorax.  Multilevel cervical disc degeneration is present, worst at C3-4. Mild-to-moderate thoracic dextroscoliosis is present. Multilevel thoracic and lumbar spondylosis are noted. Minimal vertebral body height loss in the mid thoracic spine appears chronic and may be degenerative. Lower lumbar facet arthrosis is present. Grade 1 anterolisthesis of L4 on L5 is stable to slightly increased from 2011 lumbar spine radiographs.  There is mild enlargement of the diploic space of the skull which is similar to the prior head CT without discrete lytic lesions identified.  Degenerative changes are noted involving the right greater than left glenohumeral joints. Moderate to severe osteoarthrosis is present involving the medial compartments of the left greater than right knees. Severe right moderate left hip osteoarthrosis are present.  IMPRESSION: 1. Generalized osteopenia with slightly heterogeneous lucency in the shafts of the humeri and femora. No discrete, sizable lytic lesions identified. 2. No acute fracture. 3. Advanced osteoarthrosis involving the shoulders, hips, and knees.   Electronically Signed  By: Logan Bores  On: 03/25/2015 14:13  ASSESSMENT and THERAPY PLAN:   Multiple myeloma, IgG kappa Profound anemia Dementia  She is on revlimid/dex and 1 mg coumadin. This regimen was chosen for tolerance and convenience as the patient depends upon her working daughter for transportation. She is doing well in regards to tolerance. Total protein is finally declining, myeloma labs are slowly improving.    Weakness  Intermittent in nature. I suspect it is related to her age, dementia, and other complicated medical issues. I have discussed this with her daughter and granddaughter on several occasions.  Osteopenia/lytic bone disease  She was started on Zometa 3 mg on 03/31/2015 with no problems with tolerance. This will be continued monthly. Risks and benefits of  this medication were discussed in detail with the patient's daughter prior to initiation.  Anemia Her anemia is slowly improving. We may consider adding growth factor support in the future if needed. But I am hoping to avoid doing so.   Order a blood count in 2 weeks. Follow up in 1 month.  All questions were answered. The patient knows to call the clinic with any problems, questions or concerns. We can certainly see the patient much sooner if necessary.   This note was signed electronically  This document serves as a record of services personally  performed by Ancil Linsey, MD. It was created on her behalf by Arlyce Harman, a trained medical scribe. The creation of this record is based on the scribe's personal observations and the provider's statements to them. This document has been checked and approved by the attending provider.  I have reviewed the above documentation for accuracy and completeness, and I agree with the above. This note was electronically signed Kelby Fam. Penland MD

## 2015-04-30 NOTE — Patient Instructions (Addendum)
[place] at [facility] Discharge Instructions  RECOMMENDATIONS MADE BY THE CONSULTANT AND ANY TEST RESULTS WILL BE SENT TO YOUR REFERRING PHYSICIAN.  Exam and discussion by Dr. Whitney Muse.  You will return in 2 weeks for lab work as scheduled and return for follow up with the MD as scheduled.    Thank you for choosing [place] at [facility] to provide your oncology and hematology care.  To afford each patient quality time with our provider, please arrive at least 15 minutes before your scheduled appointment time.    You need to re-schedule your appointment should you arrive 10 or more minutes late.  We strive to give you quality time with our providers, and arriving late affects you and other patients whose appointments are after yours.  Also, if you no show three or more times for appointments you may be dismissed from the clinic at the providers discretion.     Again, thank you for choosing [facility].  Our hope is that these requests will decrease the amount of time that you wait before being seen by our physicians.       _____________________________________________________________  Should you have questions after your visit to [facility], please contact our office at [phone] between the hours of 8:30 a.m. and 4:30 p.m.  Voicemails left after 4:30 p.m. will not be returned until the following business day.  For prescription refill requests, have your pharmacy contact our office.

## 2015-05-01 LAB — IGG, IGA, IGM
IGA: 52 mg/dL — AB (ref 64–422)
IGG (IMMUNOGLOBIN G), SERUM: 4515 mg/dL — AB (ref 700–1600)
IgM, Serum: <6 mg/dL — ABNORMAL LOW (ref 26–217)

## 2015-05-02 LAB — KAPPA/LAMBDA LIGHT CHAINS
KAPPA FREE LGHT CHN: 100.84 mg/L — AB (ref 3.30–19.40)
Kappa, lambda light chain ratio: 6.79 — ABNORMAL HIGH (ref 0.26–1.65)
LAMDA FREE LIGHT CHAINS: 14.86 mg/L (ref 5.71–26.30)

## 2015-05-03 LAB — PROTEIN ELECTROPHORESIS, SERUM
A/G RATIO SPE: 0.6 — AB (ref 0.7–1.7)
Albumin ELP: 3.1 g/dL (ref 2.9–4.4)
Alpha-1-Globulin: 0.3 g/dL (ref 0.0–0.4)
Alpha-2-Globulin: 0.7 g/dL (ref 0.4–1.0)
Beta Globulin: 0.8 g/dL (ref 0.7–1.3)
GLOBULIN, TOTAL: 5.2 g/dL — AB (ref 2.2–3.9)
Gamma Globulin: 3.3 g/dL — ABNORMAL HIGH (ref 0.4–1.8)
M-SPIKE, %: 3.1 g/dL — AB
Total Protein ELP: 8.3 g/dL (ref 6.0–8.5)

## 2015-05-03 LAB — IMMUNOFIXATION ELECTROPHORESIS
IgA: 53 mg/dL — ABNORMAL LOW (ref 64–422)
IgG (Immunoglobin G), Serum: 4403 mg/dL — ABNORMAL HIGH (ref 700–1600)
IgM, Serum: 11 mg/dL — ABNORMAL LOW (ref 26–217)
TOTAL PROTEIN ELP: 8.2 g/dL (ref 6.0–8.5)

## 2015-05-13 ENCOUNTER — Encounter (HOSPITAL_BASED_OUTPATIENT_CLINIC_OR_DEPARTMENT_OTHER): Payer: Medicare Other

## 2015-05-13 DIAGNOSIS — D649 Anemia, unspecified: Secondary | ICD-10-CM | POA: Diagnosis not present

## 2015-05-13 DIAGNOSIS — C9000 Multiple myeloma not having achieved remission: Secondary | ICD-10-CM

## 2015-05-13 LAB — CBC WITH DIFFERENTIAL/PLATELET
Basophils Absolute: 0.1 10*3/uL (ref 0.0–0.1)
Basophils Relative: 2 % — ABNORMAL HIGH (ref 0–1)
EOS ABS: 0.3 10*3/uL (ref 0.0–0.7)
Eosinophils Relative: 8 % — ABNORMAL HIGH (ref 0–5)
HEMATOCRIT: 27 % — AB (ref 36.0–46.0)
HEMOGLOBIN: 9.1 g/dL — AB (ref 12.0–15.0)
LYMPHS PCT: 38 % (ref 12–46)
Lymphs Abs: 1.2 10*3/uL (ref 0.7–4.0)
MCH: 33.5 pg (ref 26.0–34.0)
MCHC: 33.7 g/dL (ref 30.0–36.0)
MCV: 99.3 fL (ref 78.0–100.0)
Monocytes Absolute: 0.4 10*3/uL (ref 0.1–1.0)
Monocytes Relative: 12 % (ref 3–12)
Neutro Abs: 1.3 10*3/uL — ABNORMAL LOW (ref 1.7–7.7)
Neutrophils Relative %: 40 % — ABNORMAL LOW (ref 43–77)
PLATELETS: 188 10*3/uL (ref 150–400)
RBC: 2.72 MIL/uL — ABNORMAL LOW (ref 3.87–5.11)
RDW: 15.4 % (ref 11.5–15.5)
WBC: 3.3 10*3/uL — ABNORMAL LOW (ref 4.0–10.5)

## 2015-05-14 NOTE — Progress Notes (Signed)
Labs drawn

## 2015-05-17 ENCOUNTER — Encounter (HOSPITAL_COMMUNITY): Payer: Self-pay | Admitting: Hematology & Oncology

## 2015-05-19 ENCOUNTER — Other Ambulatory Visit (HOSPITAL_COMMUNITY): Payer: Self-pay | Admitting: Oncology

## 2015-05-19 DIAGNOSIS — C9000 Multiple myeloma not having achieved remission: Secondary | ICD-10-CM

## 2015-05-19 MED ORDER — LENALIDOMIDE 10 MG PO CAPS: 10.0000 mg | ORAL_CAPSULE | Freq: Every day | ORAL | Status: DC

## 2015-05-26 ENCOUNTER — Encounter (HOSPITAL_BASED_OUTPATIENT_CLINIC_OR_DEPARTMENT_OTHER): Payer: Medicare Other | Admitting: Hematology & Oncology

## 2015-05-26 ENCOUNTER — Encounter (HOSPITAL_COMMUNITY): Payer: Medicare Other | Attending: Family Medicine

## 2015-05-26 ENCOUNTER — Encounter (HOSPITAL_COMMUNITY): Payer: Self-pay | Admitting: Hematology & Oncology

## 2015-05-26 ENCOUNTER — Encounter (HOSPITAL_BASED_OUTPATIENT_CLINIC_OR_DEPARTMENT_OTHER): Payer: Medicare Other

## 2015-05-26 VITALS — BP 131/63 | HR 67 | Temp 98.7°F | Resp 16 | Wt 121.0 lb

## 2015-05-26 VITALS — BP 122/55 | HR 65 | Temp 98.2°F | Resp 16

## 2015-05-26 DIAGNOSIS — F0390 Unspecified dementia without behavioral disturbance: Secondary | ICD-10-CM | POA: Diagnosis not present

## 2015-05-26 DIAGNOSIS — M899 Disorder of bone, unspecified: Secondary | ICD-10-CM | POA: Diagnosis not present

## 2015-05-26 DIAGNOSIS — D649 Anemia, unspecified: Secondary | ICD-10-CM | POA: Insufficient documentation

## 2015-05-26 DIAGNOSIS — C9000 Multiple myeloma not having achieved remission: Secondary | ICD-10-CM | POA: Diagnosis present

## 2015-05-26 DIAGNOSIS — K5909 Other constipation: Secondary | ICD-10-CM

## 2015-05-26 DIAGNOSIS — M8580 Other specified disorders of bone density and structure, unspecified site: Secondary | ICD-10-CM

## 2015-05-26 LAB — CBC WITH DIFFERENTIAL/PLATELET
Basophils Absolute: 0.1 10*3/uL (ref 0.0–0.1)
Basophils Relative: 1 % (ref 0–1)
EOS PCT: 4 % (ref 0–5)
Eosinophils Absolute: 0.2 10*3/uL (ref 0.0–0.7)
HEMATOCRIT: 27.1 % — AB (ref 36.0–46.0)
Hemoglobin: 9.6 g/dL — ABNORMAL LOW (ref 12.0–15.0)
LYMPHS ABS: 1.2 10*3/uL (ref 0.7–4.0)
Lymphocytes Relative: 28 % (ref 12–46)
MCH: 34.9 pg — ABNORMAL HIGH (ref 26.0–34.0)
MCHC: 35.4 g/dL (ref 30.0–36.0)
MCV: 98.5 fL (ref 78.0–100.0)
MONO ABS: 0.2 10*3/uL (ref 0.1–1.0)
Monocytes Relative: 5 % (ref 3–12)
Neutro Abs: 2.6 10*3/uL (ref 1.7–7.7)
Neutrophils Relative %: 62 % (ref 43–77)
Platelets: 162 10*3/uL (ref 150–400)
RBC: 2.75 MIL/uL — ABNORMAL LOW (ref 3.87–5.11)
RDW: 15.8 % — ABNORMAL HIGH (ref 11.5–15.5)
WBC: 4.2 10*3/uL (ref 4.0–10.5)

## 2015-05-26 LAB — COMPREHENSIVE METABOLIC PANEL
ALK PHOS: 60 U/L (ref 38–126)
ALT: 16 U/L (ref 14–54)
ANION GAP: 5 (ref 5–15)
AST: 17 U/L (ref 15–41)
Albumin: 3 g/dL — ABNORMAL LOW (ref 3.5–5.0)
BILIRUBIN TOTAL: 0.6 mg/dL (ref 0.3–1.2)
BUN: 19 mg/dL (ref 6–20)
CHLORIDE: 98 mmol/L — AB (ref 101–111)
CO2: 31 mmol/L (ref 22–32)
CREATININE: 0.94 mg/dL (ref 0.44–1.00)
Calcium: 8.3 mg/dL — ABNORMAL LOW (ref 8.9–10.3)
GFR calc Af Amer: >60 mL/min (ref >60)
GFR, EST NON AFRICAN AMERICAN: 55 mL/min — AB (ref >60)
Glucose, Bld: 146 mg/dL — ABNORMAL HIGH (ref 65–99)
Potassium: 3.6 mmol/L (ref 3.5–5.1)
Sodium: 134 mmol/L — ABNORMAL LOW (ref 135–145)
TOTAL PROTEIN: 7.7 g/dL (ref 6.5–8.1)

## 2015-05-26 LAB — SAMPLE TO BLOOD BANK

## 2015-05-26 MED ORDER — SODIUM CHLORIDE 0.9 % IV SOLN
Freq: Once | INTRAVENOUS | Status: AC
  Administered 2015-05-26: 11:00:00 via INTRAVENOUS

## 2015-05-26 MED ORDER — ZOLEDRONIC ACID 4 MG/5ML IV CONC
3.0000 mg | Freq: Once | INTRAVENOUS | Status: AC
  Administered 2015-05-26: 3 mg via INTRAVENOUS
  Filled 2015-05-26: qty 3.75

## 2015-05-26 NOTE — Patient Instructions (Signed)
[place] at [facility] Discharge Instructions  RECOMMENDATIONS MADE BY THE CONSULTANT AND ANY TEST RESULTS WILL BE SENT TO YOUR REFERRING PHYSICIAN.  Exam and discussion today with Dr. Whitney Muse. Lab work in one month. Office visit with Kirby Crigler, PA-C in one month.   Thank you for choosing [place] at [facility] to provide your oncology and hematology care.  To afford each patient quality time with our provider, please arrive at least 15 minutes before your scheduled appointment time.    You need to re-schedule your appointment should you arrive 10 or more minutes late.  We strive to give you quality time with our providers, and arriving late affects you and other patients whose appointments are after yours.  Also, if you no show three or more times for appointments you may be dismissed from the clinic at the providers discretion.     Again, thank you for choosing [facility].  Our hope is that these requests will decrease the amount of time that you wait before being seen by our physicians.       _____________________________________________________________  Should you have questions after your visit to [facility], please contact our office at [phone] between the hours of 8:30 a.m. and 4:30 p.m.  Voicemails left after 4:30 p.m. will not be returned until the following business day.  For prescription refill requests, have your pharmacy contact our office.

## 2015-05-26 NOTE — Progress Notes (Signed)
[place] [address] [place] [address]    DIAGNOSIS:  Multiple myeloma presenting with severe anemia IgG 10,600 mg/dl with total protein 45.3 g/dl 0.63 g/dl monoclonal protein IgG kappa Beta 2 microglobulin at 8.1 mg/L BMBX on 02/08/2015 with plasma cell neoplasm at 58% plasma cells Bone survey on 03/25/2015 with generalized osteopenia with slightly heterogeneous lucency in the shafts of the humeri and femora. No discrete sizable lytic lesions identified advanced osteoarthritis   CURRENT THERAPY: Revlimid/1 mg coumadin/dexamethasone   INTERVAL HISTORY: [name] 81 y.o. female returns for additional follow-up of her myeloma.  She has really done quite well on her therapy. Her blood work continues to improve. Her appetite is described as excellent. She continues to have problems with constipation. Her daughter is uncertain as to whether or not she gets her constipation/bowel regimen at the personal care home on a regular basis. She is to be on routine Benefiber and milk of magnesia as needed.  No other complaints or concerns today. They are here for ongoing follow-up of her myeloma.   MEDICAL HISTORY: Past Medical History  Diagnosis Date  . Chronic leg pain   . Poor historian   . Psychosis   . Schizoaffective disorder   . Hypertension   . High cholesterol   . Dementia   . Thrombocytopenia 11/10/2014  . Normocytic anemia 11/10/2014  . Elevated total protein 11/10/2014  . Multiple myeloma 02/17/2015  . Arthritis   . Osteopenia 03/31/2015    On Bone scan on 03/25/2015    has HEMORRHOIDS, INTERNAL; GERD; OTHER DYSPHAGIA; CERVICAL MUSCLE STRAIN; COLONIC POLYPS, ADENOMATOUS, HX OF; Dementia without behavioral disturbance; Altered mental status; Essential hypertension; Routine gynecological examination; Cervicitis; Vaginal bleeding; Schizoaffective disorder, unspecified type; Vagina bleeding; Anemia; Normocytic anemia; Thrombocytopenia; Elevated  total protein; Constipation; Multiple myeloma; Osteopenia; Dementia; CKD (chronic kidney disease) stage 3, GFR 30-59 ml/min; Hyperglycemia; and Generalized weakness on her problem list.     has No Known Allergies.     Current Outpatient Prescriptions on File Prior to Visit  Medication Sig Dispense Refill  . acetaminophen (TYLENOL) 500 MG tablet Take 500 mg by mouth 3 (three) times daily as needed for mild pain.     Marland Kitchen amLODipine (NORVASC) 5 MG tablet Take 1 tablet (5 mg total) by mouth daily.    . Calcium Carb-Cholecalciferol (OYSTER SHELL CALCIUM + D) 500-200 MG-UNIT TABS Take 1 capsule by mouth 2 (two) times daily.    . calcium-vitamin D (OSCAL WITH D) 500-200 MG-UNIT per tablet Take 1 tablet by mouth 2 (two) times daily.    . calcium-vitamin D 250-100 MG-UNIT per tablet Take 1 tablet by mouth 2 (two) times daily.    . cephALEXin (KEFLEX) 500 MG capsule Take 1 capsule (500 mg total) by mouth 2 (two) times daily. (Patient not taking: Reported on 04/30/2015) 10 capsule 0  . clonazePAM (KLONOPIN) 0.25 MG disintegrating tablet Take 0.25 mg by mouth at bedtime.    Marland Kitchen dexamethasone (DECADRON) 4 MG tablet Take 10 tablets (40 mg total) by mouth once a week.    . fentaNYL (DURAGESIC - DOSED MCG/HR) 12 MCG/HR Place 1 patch (12.5 mcg total) onto the skin every 3 (three) days.    . ferrous sulfate 325 (65 FE) MG tablet Take 1 tablet (325 mg total) by mouth 3 (three) times daily with meals.    . fluticasone (FLONASE) 50 MCG/ACT nasal spray Place 2 sprays into both nostrils daily.    Marland Kitchen HYDROcodone-acetaminophen (  NORCO/VICODIN) 5-325 MG per tablet Take 1 tablet by mouth at bedtime as needed (pain). 20 tablet 0  . lenalidomide (REVLIMID) 10 MG capsule Take 1 capsule (10 mg total) by mouth daily. 21 capsule 0  . loratadine (CLARITIN) 10 MG tablet Take 10 mg by mouth daily.    . magnesium hydroxide (MILK OF MAGNESIA) 400 MG/5ML suspension Take 30 mLs by mouth daily as needed (bowel mobility).     . Melatonin 3  MG CAPS Take 1 capsule by mouth daily.    . Multiple Vitamins-Minerals (MULTIVITAMIN WITH MINERALS) tablet Take 1 tablet by mouth daily.    . ondansetron (ZOFRAN) 8 MG tablet Take 1 tablet (8 mg total) by mouth every 8 (eight) hours as needed for nausea or vomiting.    . polyethylene glycol (MIRALAX / GLYCOLAX) packet Take 17 g by mouth daily.    Marland Kitchen warfarin (COUMADIN) 1 MG tablet Take 1 tablet (1 mg total) by mouth daily. 30 tablet 3  . Wheat Dextrin (BENEFIBER DRINK MIX) PACK Take by mouth. Take 2 teaspoons by mouth everyday.     No current facility-administered medications on file prior to visit.     SURGICAL HISTORY: Past Surgical History  Procedure Laterality Date  . Tubal ligation    . Foot surgery    . Colonoscopy  2010    RMR: 1. Normal rectum 2. Sigmoid and cecal polyp, status post snare resection described above. Remainder of the colonic mucosa appeared normal.     SOCIAL HISTORY: History   Social History  . Marital Status: Widowed    Spouse Name: N/A  . Number of Children: N/A  . Years of Education: N/A   Occupational History  . Not on file.   Social History Main Topics  . Smoking status: Never Smoker   . Smokeless tobacco: Not on file  . Alcohol Use: No  . Drug Use: No  . Sexual Activity: Not Currently    Birth Control/ Protection: Post-menopausal   Other Topics Concern  . Not on file   Social History Narrative    FAMILY HISTORY: Family History  Problem Relation Age of Onset  . Diabetes Mother     Review of Systems  Constitutional: Negative HENT: Negative.   Eyes: Negative.   Respiratory:   Negative. Cardiovascular: Negative.   Gastrointestinal: Positive for constipation.   Genitourinary: Negative.  Musculoskeletal: Negative.   Skin: Negative.   Neurological: Negative.   Endo/Heme/Allergies: Negative.   Psychiatric/Behavioral: Positive for memory loss.    14 point review of systems was performed and is negative except as detailed under  history of present illness and above   PHYSICAL EXAMINATION  ECOG PERFORMANCE STATUS: 1 - Symptomatic but completely ambulatory  There were no vitals filed for this visit.  Physical Exam  Constitutional: She is oriented to person, place, and time and well-developed, well-nourished, and in no distress. Well groomed. Gets onto the exam table today with no assistance. HENT:  Head: Normocephalic and atraumatic.  Nose: Nose normal.  Mouth/Throat: Oropharynx is clear and moist. No oropharyngeal exudate.  Eyes: Conjunctivae and EOM are normal. Pupils are equal, round, and reactive to light. Right eye exhibits no discharge. Left eye exhibits no discharge. No scleral icterus.  Neck: Normal range of motion. Neck supple. No tracheal deviation present. No thyromegaly present.  Cardiovascular: Normal rate, regular rhythm and normal heart sounds.  Exam reveals no gallop and no friction rub.   No murmur heard. Pulmonary/Chest: Effort normal and breath sounds normal. She  has no wheezes. She has no rales.  Abdominal: Soft. Bowel sounds are normal. She exhibits no distension and no mass. There is no rebound and no guarding.  Musculoskeletal: Normal range of motion. She exhibits no edema.  Lymphadenopathy:    She has no cervical adenopathy.  Neurological: She is alert and oriented to person. She has normal reflexes. No cranial nerve deficit. Gait normal. Coordination normal.  Skin: Skin is warm and dry. No rash noted. Psychiatric: Mood normal.  Nursing note and vitals reviewed.   LABORATORY DATA: I reviewed the results below  CBC    Component Value Date/Time   WBC 4.2 05/26/2015 0948   RBC 2.75* 05/26/2015 0948   RBC 1.98* 04/14/2015 1816   HGB 9.6* 05/26/2015 0948   HCT 27.1* 05/26/2015 0948   PLT 162 05/26/2015 0948   MCV 98.5 05/26/2015 0948   MCH 34.9* 05/26/2015 0948   MCHC 35.4 05/26/2015 0948   RDW 15.8* 05/26/2015 0948   LYMPHSABS 1.2 05/26/2015 0948   MONOABS 0.2 05/26/2015 0948     EOSABS 0.2 05/26/2015 0948   BASOSABS 0.1 05/26/2015 0948    CMP     Component Value Date/Time   NA 134* 05/26/2015 0948   K 3.6 05/26/2015 0948   CL 98* 05/26/2015 0948   CO2 31 05/26/2015 0948   GLUCOSE 146* 05/26/2015 0948   BUN 19 05/26/2015 0948   CREATININE 0.94 05/26/2015 0948   CALCIUM 8.3* 05/26/2015 0948   PROT 7.7 05/26/2015 0948   ALBUMIN 3.0* 05/26/2015 0948   AST 17 05/26/2015 0948   ALT 16 05/26/2015 0948   ALKPHOS 60 05/26/2015 0948   BILITOT 0.6 05/26/2015 0948   GFRNONAA 55* 05/26/2015 0948   GFRAA >60 05/26/2015 0948      BMBX on 02/08/2015 58% plasma cells, kappa restricted BONE MARROW ASPIRATE: Paucispicular, but cellular. Erythroid precursors: Overall reduced. No significant dysplasia. Granulocytic precursors: Overall reduced. No significant dysplasia. No increase in blasts. Megakaryocytes: Present and morphologically unremarkable. Lymphocytes/plasma cells: There is a marked increase in plasma cells (58%) with atypical forms including large forms and prominent nucleoli. Lymphocytes are not increased. TOUCH PREPARATIONS: Similar to aspirate smears. CLOT and BIOPSY: The core biopsy is small, but hyper cellular for age (80%). There is a marked increase in plasma cells, including large clusters. There is admixed trilineage hematopoiesis. There are no atypical lymphoid aggregates. The clot section does not contain marrow tissue. IRON STAIN: Iron stains are performed on a bone marrow aspirate smear and section of clot. The controls stained appropriately. There are no particles on the aspirate or clot section for evaluation. Storage Iron: N/A. Ringed Sideroblasts: N/A. ADDITIONAL DATA / TESTING: Cytogenetics was ordered, including FISH. Per records the patient has a M-spike with IgG kappa (8.68 g/dL).  RADIOLOGY:  CLINICAL DATA: Multiple myeloma.  EXAM: METASTATIC BONE SURVEY  COMPARISON: Chest radiograph 11/09/2014. Head CT 06/01/2012.  CT abdomen and pelvis 05/27/2012. Bilateral knee radiographs and pelvic radiograph 02/25/2012. Lumbar spine radiographs 10/14/2010.  FINDINGS: There is diffuse, generalized osteopenia. Small areas of slightly increased, rounded lucency are present in the shafts of multiple long bones, specifically the humeri and femora, without sizable discrete lytic lesions identified.  Mild-to-moderate enlargement of the cardiac silhouette is unchanged. Calcification and tortuosity are noted of the thoracic aorta. Pulmonary vascular congestion on the prior chest radiograph has resolved. Left basilar opacity has also resolved. No evidence of acute airspace consolidation, edema, pleural effusion, or pneumothorax.  Multilevel cervical disc degeneration is present, worst at C3-4. Mild-to-moderate  thoracic dextroscoliosis is present. Multilevel thoracic and lumbar spondylosis are noted. Minimal vertebral body height loss in the mid thoracic spine appears chronic and may be degenerative. Lower lumbar facet arthrosis is present. Grade 1 anterolisthesis of L4 on L5 is stable to slightly increased from 2011 lumbar spine radiographs.  There is mild enlargement of the diploic space of the skull which is similar to the prior head CT without discrete lytic lesions identified.  Degenerative changes are noted involving the right greater than left glenohumeral joints. Moderate to severe osteoarthrosis is present involving the medial compartments of the left greater than right knees. Severe right moderate left hip osteoarthrosis are present.  IMPRESSION: 1. Generalized osteopenia with slightly heterogeneous lucency in the shafts of the humeri and femora. No discrete, sizable lytic lesions identified. 2. No acute fracture. 3. Advanced osteoarthrosis involving the shoulders, hips, and knees.   Electronically Signed  By: Logan Bores  On: 03/25/2015 14:13  ASSESSMENT and THERAPY PLAN:    Multiple myeloma, IgG kappa Profound anemia Dementia  She is on revlimid/dex and 1 mg coumadin. This regimen was chosen for tolerance and convenience as the patient depends upon her working daughter for transportation. She is doing well in regards to tolerance. Total protein is finally declining, myeloma labs are slowly improving. She is overall doing quite well and we will continue with the treatment plan as outlined.   Osteopenia/lytic bone disease  She was started on Zometa 3 mg on 03/31/2015 with no problems with tolerance. This will be continued monthly. Risks and benefits of this medication were discussed in detail with the patient's daughter prior to initiation. Directed calcium is adequate today. They were again advised to make sure the patient is taking adequate calcium and vitamin D.  Anemia Her anemia is slowly improving. We may consider adding growth factor support in the future if needed. But I am hoping to avoid doing so.   Constipation  This is a chronic issue. I have advised them to start her on Senokot-S twice daily and MiraLAX powder in 8 ounces of fluid daily. They can adjust as needed. Advised him to call if they have difficulties with this regimen. In regards to her current constipation they will try an enema today.  Follow up in 1 month.  All questions were answered. The patient knows to call the clinic with any problems, questions or concerns. We can certainly see the patient much sooner if necessary.   This document serves as a record of services personally performed by Ancil Linsey, MD. It was created on her behalf by Janace Hoard, a trained medical scribe. The creation of this record is based on the scribe's personal observations and the provider's statements to them. This document has been checked and approved by the attending provider.  I have reviewed the above documentation for accuracy and completeness, and I agree with the above.  This note was  electronically signed  Kelby Fam. Penland MD

## 2015-05-26 NOTE — Progress Notes (Signed)
LABS DRAWN

## 2015-05-26 NOTE — Progress Notes (Signed)
Patient tolerated infusion well.  VSS after infusion complete.

## 2015-05-27 LAB — PROTEIN ELECTROPHORESIS, SERUM
A/G RATIO SPE: 0.7 (ref 0.7–1.7)
ALBUMIN ELP: 3.1 g/dL (ref 2.9–4.4)
Alpha-1-Globulin: 0.3 g/dL (ref 0.0–0.4)
Alpha-2-Globulin: 0.6 g/dL (ref 0.4–1.0)
Beta Globulin: 0.7 g/dL (ref 0.7–1.3)
GAMMA GLOBULIN: 2.5 g/dL — AB (ref 0.4–1.8)
Globulin, Total: 4.2 g/dL — ABNORMAL HIGH (ref 2.2–3.9)
M-Spike, %: 2.5 g/dL — ABNORMAL HIGH
Total Protein ELP: 7.3 g/dL (ref 6.0–8.5)

## 2015-05-27 LAB — IGG, IGA, IGM
IGM, SERUM: 14 mg/dL — AB (ref 26–217)
IgA: 35 mg/dL — ABNORMAL LOW (ref 64–422)
IgG (Immunoglobin G), Serum: 3038 mg/dL — ABNORMAL HIGH (ref 700–1600)

## 2015-05-27 LAB — KAPPA/LAMBDA LIGHT CHAINS
KAPPA FREE LGHT CHN: 75.4 mg/L — AB (ref 3.30–19.40)
Kappa, lambda light chain ratio: 7.65 — ABNORMAL HIGH (ref 0.26–1.65)
LAMDA FREE LIGHT CHAINS: 9.85 mg/L (ref 5.71–26.30)

## 2015-05-28 LAB — IMMUNOFIXATION ELECTROPHORESIS
IgA: 33 mg/dL — ABNORMAL LOW (ref 64–422)
IgG (Immunoglobin G), Serum: 3367 mg/dL — ABNORMAL HIGH (ref 700–1600)
IgM, Serum: 15 mg/dL — ABNORMAL LOW (ref 26–217)
TOTAL PROTEIN ELP: 7.1 g/dL (ref 6.0–8.5)

## 2015-06-09 ENCOUNTER — Other Ambulatory Visit (HOSPITAL_COMMUNITY): Payer: Self-pay | Admitting: Oncology

## 2015-06-09 DIAGNOSIS — C9000 Multiple myeloma not having achieved remission: Secondary | ICD-10-CM

## 2015-06-09 MED ORDER — LENALIDOMIDE 10 MG PO CAPS: 10.0000 mg | ORAL_CAPSULE | Freq: Every day | ORAL | Status: DC

## 2015-06-23 ENCOUNTER — Encounter (HOSPITAL_BASED_OUTPATIENT_CLINIC_OR_DEPARTMENT_OTHER): Payer: Medicare Other

## 2015-06-23 ENCOUNTER — Encounter (HOSPITAL_COMMUNITY): Payer: Medicare Other | Attending: Family Medicine | Admitting: Oncology

## 2015-06-23 VITALS — BP 141/63 | HR 70 | Temp 97.9°F | Resp 16 | Wt 114.9 lb

## 2015-06-23 DIAGNOSIS — C9000 Multiple myeloma not having achieved remission: Secondary | ICD-10-CM

## 2015-06-23 DIAGNOSIS — M8580 Other specified disorders of bone density and structure, unspecified site: Secondary | ICD-10-CM | POA: Diagnosis not present

## 2015-06-23 DIAGNOSIS — D649 Anemia, unspecified: Secondary | ICD-10-CM

## 2015-06-23 LAB — CBC WITH DIFFERENTIAL/PLATELET
BASOS ABS: 0.1 10*3/uL (ref 0.0–0.1)
BASOS PCT: 1 % (ref 0–1)
EOS ABS: 0.1 10*3/uL (ref 0.0–0.7)
Eosinophils Relative: 4 % (ref 0–5)
HEMATOCRIT: 30.6 % — AB (ref 36.0–46.0)
HEMOGLOBIN: 10.7 g/dL — AB (ref 12.0–15.0)
Lymphocytes Relative: 36 % (ref 12–46)
Lymphs Abs: 1.3 10*3/uL (ref 0.7–4.0)
MCH: 34.7 pg — ABNORMAL HIGH (ref 26.0–34.0)
MCHC: 35 g/dL (ref 30.0–36.0)
MCV: 99.4 fL (ref 78.0–100.0)
Monocytes Absolute: 0.4 10*3/uL (ref 0.1–1.0)
Monocytes Relative: 9 % (ref 3–12)
NEUTROS ABS: 1.9 10*3/uL (ref 1.7–7.7)
NEUTROS PCT: 50 % (ref 43–77)
Platelets: 142 10*3/uL — ABNORMAL LOW (ref 150–400)
RBC: 3.08 MIL/uL — ABNORMAL LOW (ref 3.87–5.11)
RDW: 14.3 % (ref 11.5–15.5)
WBC: 3.8 10*3/uL — AB (ref 4.0–10.5)

## 2015-06-23 LAB — COMPREHENSIVE METABOLIC PANEL
ALK PHOS: 48 U/L (ref 38–126)
ALT: 13 U/L — ABNORMAL LOW (ref 14–54)
AST: 19 U/L (ref 15–41)
Albumin: 3.4 g/dL — ABNORMAL LOW (ref 3.5–5.0)
Anion gap: 8 (ref 5–15)
BUN: 16 mg/dL (ref 6–20)
CALCIUM: 8.6 mg/dL — AB (ref 8.9–10.3)
CO2: 27 mmol/L (ref 22–32)
Chloride: 101 mmol/L (ref 101–111)
Creatinine, Ser: 1.07 mg/dL — ABNORMAL HIGH (ref 0.44–1.00)
GFR calc non Af Amer: 47 mL/min — ABNORMAL LOW (ref >60)
GFR, EST AFRICAN AMERICAN: 54 mL/min — AB (ref >60)
GLUCOSE: 109 mg/dL — AB (ref 65–99)
POTASSIUM: 3.9 mmol/L (ref 3.5–5.1)
SODIUM: 136 mmol/L (ref 135–145)
Total Bilirubin: 0.3 mg/dL (ref 0.3–1.2)
Total Protein: 7.6 g/dL (ref 6.5–8.1)

## 2015-06-23 NOTE — Progress Notes (Signed)
LABS DRAWN

## 2015-06-23 NOTE — Assessment & Plan Note (Addendum)
IgG multiple myeloma presenting with severe anemia.   BMBX on 02/08/2015 with plasma cell neoplasm at 58% plasma cells.  Currently on Revlimid/Dex and 1 mg of Coumadin daily.  Additionally, she is on Zometa monthly.  Supportive therapy plan reviewed.  Anemia is improving significantly.   Labs in 4 weeks: CBC diff, CMET, LDH, ESR, CRP, MM panel, B2M  Return in 4 weeks for follow-up.  Continue Zometa with Calcium and Vit D supplementation.  Zometa is due next month.

## 2015-06-23 NOTE — Progress Notes (Signed)
[place] [address] [place] [address]  Multiple myeloma - Plan: CBC with Differential, Comprehensive metabolic panel, Lactate dehydrogenase, Sedimentation rate, Beta 2 microglobuline, serum, C-reactive protein, Multiple myeloma panel, serum, Kappa/lambda light chains  CURRENT THERAPY: RD and 1 mg of Coumadin daily.  INTERVAL HISTORY: [name] 81 y.o. female returns for followup of IgG multiple myeloma presenting with severe anemia.   BMBX on 02/08/2015 with plasma cell neoplasm at 58% plasma cells.    Multiple myeloma   02/08/2015 Bone Marrow Biopsy The bone marrow exhibits a marked increase in plasma cells (58% by aspirate), consistent with plasma cell myeloma.   03/05/2015 -  Chemotherapy Revlimid 10 mg days 1-21 every 28 days and 40 mg of Dexamethasone weekly.   03/25/2015 Imaging Bone survey- Generalized osteopenia with slightly heterogeneous lucency in the shafts of the humeri and femora. No discrete, sizable lytic lesions identified.    I personally reviewed and went over laboratory results with the patient.  The results are noted within this dictation.  Her Hgb is up to 10.7 g/dL, indicative of a response to therapy.  She reports that she is doing well.  Her daughter confirms a clinical improvement in the patient.  Compliance with Coumadin is confirmed.  Weight is stable overall.   Past Medical History  Diagnosis Date  . Chronic leg pain   . Poor historian   . Psychosis   . Schizoaffective disorder   . Hypertension   . High cholesterol   . Dementia   . Thrombocytopenia 11/10/2014  . Normocytic anemia 11/10/2014  . Elevated total protein 11/10/2014  . Multiple myeloma 02/17/2015  . Arthritis   . Osteopenia 03/31/2015    On Bone scan on 03/25/2015    has HEMORRHOIDS, INTERNAL; GERD; OTHER DYSPHAGIA; CERVICAL MUSCLE STRAIN; COLONIC POLYPS, ADENOMATOUS, HX OF; Dementia without behavioral disturbance; Altered mental status;  Essential hypertension; Routine gynecological examination; Cervicitis; Vaginal bleeding; Schizoaffective disorder, unspecified type; Vagina bleeding; Anemia; Normocytic anemia; Thrombocytopenia; Elevated total protein; Constipation; Multiple myeloma; Osteopenia; Dementia; CKD (chronic kidney disease) stage 3, GFR 30-59 ml/min; Hyperglycemia; and Generalized weakness on her problem list.     has No Known Allergies.  Current Outpatient Prescriptions on File Prior to Visit  Medication Sig Dispense Refill  . acetaminophen (TYLENOL) 500 MG tablet Take 500 mg by mouth 3 (three) times daily as needed for mild pain.     Marland Kitchen amLODipine (NORVASC) 5 MG tablet Take 1 tablet (5 mg total) by mouth daily.    . Calcium Carb-Cholecalciferol (OYSTER SHELL CALCIUM + D) 500-200 MG-UNIT TABS Take 1 capsule by mouth 2 (two) times daily.    . calcium-vitamin D (OSCAL WITH D) 500-200 MG-UNIT per tablet Take 1 tablet by mouth 2 (two) times daily.    . calcium-vitamin D 250-100 MG-UNIT per tablet Take 1 tablet by mouth 2 (two) times daily.    . cephALEXin (KEFLEX) 500 MG capsule Take 1 capsule (500 mg total) by mouth 2 (two) times daily. 10 capsule 0  . clonazePAM (KLONOPIN) 0.25 MG disintegrating tablet Take 0.25 mg by mouth at bedtime.    Marland Kitchen dexamethasone (DECADRON) 4 MG tablet Take 10 tablets (40 mg total) by mouth once a week.    . fentaNYL (DURAGESIC - DOSED MCG/HR) 12 MCG/HR Place 1 patch (12.5 mcg total) onto the skin every 3 (three) days.    . ferrous sulfate 325 (65 FE) MG tablet Take 1 tablet (325 mg total) by  mouth 3 (three) times daily with meals.    . fluticasone (FLONASE) 50 MCG/ACT nasal spray Place 2 sprays into both nostrils daily.    Marland Kitchen HYDROcodone-acetaminophen (NORCO/VICODIN) 5-325 MG per tablet Take 1 tablet by mouth at bedtime as needed (pain). 20 tablet 0  . lenalidomide (REVLIMID) 10 MG capsule Take 1 capsule (10 mg total) by mouth daily. 21 capsule 0  . loratadine (CLARITIN) 10 MG tablet Take 10 mg by  mouth daily.    . magnesium hydroxide (MILK OF MAGNESIA) 400 MG/5ML suspension Take 30 mLs by mouth daily as needed (bowel mobility).     . Melatonin 3 MG CAPS Take 1 capsule by mouth daily.    . Multiple Vitamins-Minerals (MULTIVITAMIN WITH MINERALS) tablet Take 1 tablet by mouth daily.    . ondansetron (ZOFRAN) 8 MG tablet Take 1 tablet (8 mg total) by mouth every 8 (eight) hours as needed for nausea or vomiting.    . polyethylene glycol (MIRALAX / GLYCOLAX) packet Take 17 g by mouth daily.    Marland Kitchen warfarin (COUMADIN) 1 MG tablet Take 1 tablet (1 mg total) by mouth daily. 30 tablet 3  . Wheat Dextrin (BENEFIBER DRINK MIX) PACK Take by mouth. Take 2 teaspoons by mouth everyday.     No current facility-administered medications on file prior to visit.    Past Surgical History  Procedure Laterality Date  . Tubal ligation    . Foot surgery    . Colonoscopy  2010    RMR: 1. Normal rectum 2. Sigmoid and cecal polyp, status post snare resection described above. Remainder of the colonic mucosa appeared normal.     Denies any headaches, dizziness, double vision, fevers, chills, night sweats, nausea, vomiting, diarrhea, constipation, chest pain, heart palpitations, shortness of breath, blood in stool, black tarry stool, urinary pain, urinary burning, urinary frequency, hematuria.   PHYSICAL EXAMINATION  ECOG PERFORMANCE STATUS: 1 - Symptomatic but completely ambulatory  Filed Vitals:   06/23/15 1256  BP: 141/63  Pulse: 70  Temp: 97.9 F (36.6 C)  Resp: 16    GENERAL:alert, no distress, well nourished, well developed, comfortable, cooperative, smiling, pleasantly confused, and accompanied by daughter. SKIN: skin color, texture, turgor are normal, no rashes or significant lesions HEAD: Normocephalic, No masses, lesions, tenderness or abnormalities EYES: normal, PERRLA, EOMI, Conjunctiva are pink and non-injected EARS: External ears normal OROPHARYNX:lips, buccal mucosa, and tongue normal  and mucous membranes are moist  NECK: supple, no adenopathy, thyroid normal size, non-tender, without nodularity, no stridor, non-tender, trachea midline LYMPH:  no palpable lymphadenopathy BREAST:not examined LUNGS: clear to auscultation  HEART: regular rate & rhythm ABDOMEN:abdomen soft, non-tender and normal bowel sounds BACK: Back symmetric, no curvature., No CVA tenderness EXTREMITIES:less then 2 second capillary refill, no joint deformities, effusion, or inflammation, no skin discoloration, no cyanosis  NEURO: alert & oriented x 3 with fluent speech, no focal motor/sensory deficits, gait normal    LABORATORY DATA: CBC    Component Value Date/Time   WBC 3.8* 06/23/2015 1250   RBC 3.08* 06/23/2015 1250   RBC 1.98* 04/14/2015 1816   HGB 10.7* 06/23/2015 1250   HCT 30.6* 06/23/2015 1250   PLT 142* 06/23/2015 1250   MCV 99.4 06/23/2015 1250   MCH 34.7* 06/23/2015 1250   MCHC 35.0 06/23/2015 1250   RDW 14.3 06/23/2015 1250   LYMPHSABS 1.3 06/23/2015 1250   MONOABS 0.4 06/23/2015 1250   EOSABS 0.1 06/23/2015 1250   BASOSABS 0.1 06/23/2015 1250      Chemistry  Component Value Date/Time   NA 136 06/23/2015 1250   K 3.9 06/23/2015 1250   CL 101 06/23/2015 1250   CO2 27 06/23/2015 1250   BUN 16 06/23/2015 1250   CREATININE 1.07* 06/23/2015 1250      Component Value Date/Time   CALCIUM 8.6* 06/23/2015 1250   ALKPHOS 48 06/23/2015 1250   AST 19 06/23/2015 1250   ALT 13* 06/23/2015 1250   BILITOT 0.3 06/23/2015 1250        PENDING LABS:   RADIOGRAPHIC STUDIES:  No results found.   PATHOLOGY:    ASSESSMENT AND PLAN:  Multiple myeloma IgG multiple myeloma presenting with severe anemia.   BMBX on 02/08/2015 with plasma cell neoplasm at 58% plasma cells.  Currently on Revlimid/Dex and 1 mg of Coumadin daily.  Additionally, she is on Zometa monthly.  Supportive therapy plan reviewed.  Anemia is improving significantly.   Labs in 4 weeks: CBC diff, CMET,  LDH, ESR, CRP, MM panel, B2M  Return in 4 weeks for follow-up.  Continue Zometa with Calcium and Vit D supplementation.  Zometa is due next month.   THERAPY PLAN:  Continue with treatment as outlined.  All questions were answered. The patient knows to call the clinic with any problems, questions or concerns. We can certainly see the patient much sooner if necessary.  Patient and plan discussed with Dr. Ancil Linsey and she is in agreement with the aforementioned.   This note is electronically signed by: Doy Mince 06/23/2015 1:39 PM

## 2015-06-23 NOTE — Patient Instructions (Signed)
..  [place] at [facility] Discharge Instructions  RECOMMENDATIONS MADE BY THE CONSULTANT AND ANY TEST RESULTS WILL BE SENT TO YOUR REFERRING PHYSICIAN.  Seen and discussion with Robynn Pane  PA-C.  Call the cancer center with any question and/or concerns that you have.   Lab work in 4 weeks. Zometa in 4 weeks. Follow up appt in 4 weeks with Dr. Whitney Muse.      Thank you for choosing [place] at [facility] to provide your oncology and hematology care.  To afford each patient quality time with our provider, please arrive at least 15 minutes before your scheduled appointment time.    You need to re-schedule your appointment should you arrive 10 or more minutes late.  We strive to give you quality time with our providers, and arriving late affects you and other patients whose appointments are after yours.  Also, if you no show three or more times for appointments you may be dismissed from the clinic at the providers discretion.     Again, thank you for choosing [facility].  Our hope is that these requests will decrease the amount of time that you wait before being seen by our physicians.       _____________________________________________________________  Should you have questions after your visit to [facility], please contact our office at [phone] between the hours of 8:30 a.m. and 4:30 p.m.  Voicemails left after 4:30 p.m. will not be returned until the following business day.  For prescription refill requests, have your pharmacy contact our office.

## 2015-07-12 ENCOUNTER — Other Ambulatory Visit (HOSPITAL_COMMUNITY): Payer: Self-pay | Admitting: Oncology

## 2015-07-12 DIAGNOSIS — C9000 Multiple myeloma not having achieved remission: Secondary | ICD-10-CM

## 2015-07-12 MED ORDER — LENALIDOMIDE 10 MG PO CAPS: 10.0000 mg | ORAL_CAPSULE | Freq: Every day | ORAL | Status: DC

## 2015-07-21 ENCOUNTER — Encounter (HOSPITAL_COMMUNITY): Payer: Self-pay | Admitting: Oncology

## 2015-07-21 ENCOUNTER — Encounter (HOSPITAL_BASED_OUTPATIENT_CLINIC_OR_DEPARTMENT_OTHER): Payer: Medicare Other

## 2015-07-21 ENCOUNTER — Encounter (HOSPITAL_BASED_OUTPATIENT_CLINIC_OR_DEPARTMENT_OTHER): Payer: Medicare Other | Admitting: Oncology

## 2015-07-21 ENCOUNTER — Encounter (HOSPITAL_COMMUNITY): Payer: Medicare Other | Attending: Family Medicine

## 2015-07-21 VITALS — BP 129/69 | HR 79 | Temp 98.5°F | Resp 18

## 2015-07-21 VITALS — BP 139/68 | HR 83 | Temp 99.3°F | Resp 16 | Wt 123.8 lb

## 2015-07-21 DIAGNOSIS — C9000 Multiple myeloma not having achieved remission: Secondary | ICD-10-CM

## 2015-07-21 DIAGNOSIS — D649 Anemia, unspecified: Secondary | ICD-10-CM | POA: Diagnosis not present

## 2015-07-21 LAB — CBC WITH DIFFERENTIAL/PLATELET
BASOS ABS: 0.1 10*3/uL (ref 0.0–0.1)
Basophils Relative: 2 % — ABNORMAL HIGH (ref 0–1)
EOS ABS: 0.2 10*3/uL (ref 0.0–0.7)
EOS PCT: 4 % (ref 0–5)
HCT: 32.5 % — ABNORMAL LOW (ref 36.0–46.0)
Hemoglobin: 11.4 g/dL — ABNORMAL LOW (ref 12.0–15.0)
Lymphocytes Relative: 29 % (ref 12–46)
Lymphs Abs: 1.2 10*3/uL (ref 0.7–4.0)
MCH: 34.5 pg — ABNORMAL HIGH (ref 26.0–34.0)
MCHC: 35.1 g/dL (ref 30.0–36.0)
MCV: 98.5 fL (ref 78.0–100.0)
Monocytes Absolute: 0.4 10*3/uL (ref 0.1–1.0)
Monocytes Relative: 10 % (ref 3–12)
Neutro Abs: 2.2 10*3/uL (ref 1.7–7.7)
Neutrophils Relative %: 55 % (ref 43–77)
PLATELETS: 148 10*3/uL — AB (ref 150–400)
RBC: 3.3 MIL/uL — AB (ref 3.87–5.11)
RDW: 13.4 % (ref 11.5–15.5)
WBC: 4 10*3/uL (ref 4.0–10.5)

## 2015-07-21 LAB — COMPREHENSIVE METABOLIC PANEL
ALT: 14 U/L (ref 14–54)
AST: 17 U/L (ref 15–41)
Albumin: 3.5 g/dL (ref 3.5–5.0)
Alkaline Phosphatase: 57 U/L (ref 38–126)
Anion gap: 4 — ABNORMAL LOW (ref 5–15)
BUN: 19 mg/dL (ref 6–20)
CHLORIDE: 103 mmol/L (ref 101–111)
CO2: 30 mmol/L (ref 22–32)
CREATININE: 0.88 mg/dL (ref 0.44–1.00)
Calcium: 8.5 mg/dL — ABNORMAL LOW (ref 8.9–10.3)
GFR calc non Af Amer: 59 mL/min — ABNORMAL LOW (ref >60)
Glucose, Bld: 101 mg/dL — ABNORMAL HIGH (ref 65–99)
POTASSIUM: 3.6 mmol/L (ref 3.5–5.1)
SODIUM: 137 mmol/L (ref 135–145)
Total Bilirubin: 0.5 mg/dL (ref 0.3–1.2)
Total Protein: 7.6 g/dL (ref 6.5–8.1)

## 2015-07-21 LAB — C-REACTIVE PROTEIN: CRP: 0.5 mg/dL (ref <1.0)

## 2015-07-21 LAB — SEDIMENTATION RATE: SED RATE: 15 mm/h (ref 0–22)

## 2015-07-21 LAB — LACTATE DEHYDROGENASE: LDH: 85 U/L — ABNORMAL LOW (ref 98–192)

## 2015-07-21 MED ORDER — ZOLEDRONIC ACID 4 MG/5ML IV CONC
3.0000 mg | Freq: Once | INTRAVENOUS | Status: AC
  Administered 2015-07-21: 3 mg via INTRAVENOUS
  Filled 2015-07-21: qty 3.75

## 2015-07-21 MED ORDER — SODIUM CHLORIDE 0.9 % IV SOLN
Freq: Once | INTRAVENOUS | Status: AC
  Administered 2015-07-21: 12:00:00 via INTRAVENOUS

## 2015-07-21 NOTE — Progress Notes (Signed)
[name] Tolerated zometa infusion Discharged ambulatory  

## 2015-07-21 NOTE — Progress Notes (Signed)
[place] [address] [place] [address]  Multiple myeloma - Plan: CBC with Differential, Comprehensive metabolic panel, Lactate dehydrogenase, Sedimentation rate, C-reactive protein, Beta 2 microglobuline, serum, Multiple myeloma panel, serum, Kappa/lambda light chains  CURRENT THERAPY: RD and 1 mg of Coumadin daily.  INTERVAL HISTORY: [name] 81 y.o. female returns for followup of IgG multiple myeloma presenting with severe anemia.   BMBX on 02/08/2015 with plasma cell neoplasm at 58% plasma cells.    Multiple myeloma   02/08/2015 Bone Marrow Biopsy The bone marrow exhibits a marked increase in plasma cells (58% by aspirate), consistent with plasma cell myeloma.   03/05/2015 -  Chemotherapy Revlimid 10 mg days 1-21 every 28 days and 40 mg of Dexamethasone weekly.   03/25/2015 Imaging Bone survey- Generalized osteopenia with slightly heterogeneous lucency in the shafts of the humeri and femora. No discrete, sizable lytic lesions identified.    I personally reviewed and went over laboratory results with the patient.  The results are noted within this dictation.  Her Hgb is 11.4 g/dL.  Minimal thrombocytopenia is noted.  WBC is WNL.  Correct calcium is WNL as well.  Compliance with Coumadin is confirmed.  She reports that she is doing well.  She denies any bleeding or bruising.  She notes that Loratadine worked better for her than Flonase, therefore, I wrote an order for her nursing facility for Loratadine.     Past Medical History  Diagnosis Date  . Chronic leg pain   . Poor historian   . Psychosis   . Schizoaffective disorder   . Hypertension   . High cholesterol   . Dementia   . Thrombocytopenia 11/10/2014  . Normocytic anemia 11/10/2014  . Elevated total protein 11/10/2014  . Multiple myeloma 02/17/2015  . Arthritis   . Osteopenia 03/31/2015    On Bone scan on 03/25/2015    has HEMORRHOIDS, INTERNAL; GERD; OTHER DYSPHAGIA; CERVICAL  MUSCLE STRAIN; COLONIC POLYPS, ADENOMATOUS, HX OF; Dementia without behavioral disturbance; Altered mental status; Essential hypertension; Routine gynecological examination; Cervicitis; Vaginal bleeding; Schizoaffective disorder, unspecified type; Vagina bleeding; Anemia; Normocytic anemia; Thrombocytopenia; Elevated total protein; Constipation; Multiple myeloma; Osteopenia; Dementia; CKD (chronic kidney disease) stage 3, GFR 30-59 ml/min; Hyperglycemia; and Generalized weakness on her problem list.     has No Known Allergies.  Current Outpatient Prescriptions on File Prior to Visit  Medication Sig Dispense Refill  . acetaminophen (TYLENOL) 500 MG tablet Take 500 mg by mouth 3 (three) times daily as needed for mild pain.     Marland Kitchen amLODipine (NORVASC) 5 MG tablet Take 1 tablet (5 mg total) by mouth daily.    . Calcium Carb-Cholecalciferol (OYSTER SHELL CALCIUM + D) 500-200 MG-UNIT TABS Take 1 capsule by mouth 2 (two) times daily.    . clonazePAM (KLONOPIN) 0.25 MG disintegrating tablet Take 0.25 mg by mouth at bedtime.    Marland Kitchen dexamethasone (DECADRON) 4 MG tablet Take 10 tablets (40 mg total) by mouth once a week.    . fentaNYL (DURAGESIC - DOSED MCG/HR) 12 MCG/HR Place 1 patch (12.5 mcg total) onto the skin every 3 (three) days.    . ferrous sulfate 325 (65 FE) MG tablet Take 1 tablet (325 mg total) by mouth 3 (three) times daily with meals.    . fluticasone (FLONASE) 50 MCG/ACT nasal spray Place 2 sprays into both nostrils daily.    Marland Kitchen lenalidomide (REVLIMID) 10 MG capsule Take 1 capsule (10 mg total)  by mouth daily. 21 capsule 0  . magnesium hydroxide (MILK OF MAGNESIA) 400 MG/5ML suspension Take 30 mLs by mouth daily as needed (bowel mobility).     . Melatonin 3 MG CAPS Take 1 capsule by mouth daily.    . Multiple Vitamins-Minerals (MULTIVITAMIN WITH MINERALS) tablet Take 1 tablet by mouth daily.    . polyethylene glycol (MIRALAX / GLYCOLAX) packet Take 17 g by mouth daily.    Marland Kitchen warfarin (COUMADIN) 1  MG tablet Take 1 tablet (1 mg total) by mouth daily. 30 tablet 3  . Wheat Dextrin (BENEFIBER DRINK MIX) PACK Take by mouth. Take 2 teaspoons by mouth everyday.    Marland Kitchen HYDROcodone-acetaminophen (NORCO/VICODIN) 5-325 MG per tablet Take 1 tablet by mouth at bedtime as needed (pain). (Patient not taking: Reported on 07/21/2015) 20 tablet 0  . loratadine (CLARITIN) 10 MG tablet Take 10 mg by mouth daily.    . ondansetron (ZOFRAN) 8 MG tablet Take 1 tablet (8 mg total) by mouth every 8 (eight) hours as needed for nausea or vomiting. (Patient not taking: Reported on 07/21/2015)     Current Facility-Administered Medications on File Prior to Visit  Medication Dose Route Frequency Provider Last Rate Last Dose  . 0.9 %  sodium chloride infusion   Intravenous Once Baird Cancer, PA-C      . zolendronic acid (ZOMETA) 3 mg in sodium chloride 0.9 % 100 mL IVPB  3 mg Intravenous Once Baird Cancer, PA-C        Past Surgical History  Procedure Laterality Date  . Tubal ligation    . Foot surgery    . Colonoscopy  2010    RMR: 1. Normal rectum 2. Sigmoid and cecal polyp, status post snare resection described above. Remainder of the colonic mucosa appeared normal.     Denies any headaches, dizziness, double vision, fevers, chills, night sweats, nausea, vomiting, diarrhea, constipation, chest pain, heart palpitations, shortness of breath, blood in stool, black tarry stool, urinary pain, urinary burning, urinary frequency, hematuria.   PHYSICAL EXAMINATION  ECOG PERFORMANCE STATUS: 1 - Symptomatic but completely ambulatory  Filed Vitals:   07/21/15 1016  BP: 139/68  Pulse: 83  Temp: 99.3 F (37.4 C)  Resp: 16    GENERAL:alert, no distress, well nourished, well developed, comfortable, cooperative, smiling, pleasantly confused, and accompanied by daughter. SKIN: skin color, texture, turgor are normal, no rashes or significant lesions HEAD: Normocephalic, No masses, lesions, tenderness or  abnormalities EYES: normal, PERRLA, EOMI, Conjunctiva are pink and non-injected EARS: External ears normal OROPHARYNX:lips, buccal mucosa, and tongue normal and mucous membranes are moist  NECK: supple, no adenopathy, thyroid normal size, non-tender, without nodularity, no stridor, non-tender, trachea midline LYMPH:  no palpable lymphadenopathy BREAST:not examined LUNGS: clear to auscultation  HEART: regular rate & rhythm ABDOMEN:abdomen soft, non-tender and normal bowel sounds BACK: Back symmetric, no curvature., No CVA tenderness EXTREMITIES:less then 2 second capillary refill, no joint deformities, effusion, or inflammation, no skin discoloration, no cyanosis  NEURO: alert & oriented x 3 with fluent speech, no focal motor/sensory deficits, gait normal    LABORATORY DATA: CBC    Component Value Date/Time   WBC 4.0 07/21/2015 1001   RBC 3.30* 07/21/2015 1001   RBC 1.98* 04/14/2015 1816   HGB 11.4* 07/21/2015 1001   HCT 32.5* 07/21/2015 1001   PLT 148* 07/21/2015 1001   MCV 98.5 07/21/2015 1001   MCH 34.5* 07/21/2015 1001   MCHC 35.1 07/21/2015 1001   RDW 13.4 07/21/2015 1001  LYMPHSABS 1.2 07/21/2015 1001   MONOABS 0.4 07/21/2015 1001   EOSABS 0.2 07/21/2015 1001   BASOSABS 0.1 07/21/2015 1001      Chemistry      Component Value Date/Time   NA 137 07/21/2015 1001   K 3.6 07/21/2015 1001   CL 103 07/21/2015 1001   CO2 30 07/21/2015 1001   BUN 19 07/21/2015 1001   CREATININE 0.88 07/21/2015 1001      Component Value Date/Time   CALCIUM 8.5* 07/21/2015 1001   ALKPHOS 57 07/21/2015 1001   AST 17 07/21/2015 1001   ALT 14 07/21/2015 1001   BILITOT 0.5 07/21/2015 1001        PENDING LABS:   RADIOGRAPHIC STUDIES:  No results found.   PATHOLOGY:    ASSESSMENT AND PLAN:  Multiple myeloma IgG multiple myeloma presenting with severe anemia.   BMBX on 02/08/2015 with plasma cell neoplasm at 58% plasma cells.  Currently on Revlimid/Dex and 1 mg of Coumadin  daily.  Additionally, she is on Zometa monthly.  Supportive therapy plan reviewed.  She is due for Zometa today.  Corrected calcium is 8.9.  COmpliance with Ca++ and Vit D encouraged.  Anemia is improving significantly. Her Hgb is 11.4 g/dL today.  Minimal thrombocytopenia is noted.  No changes needed at this time.   Labs in 4 weeks: CBC diff, CMET, LDH, ESR, CRP, MM panel, B2M  Return in 4 weeks for follow-up.   THERAPY PLAN:  Continue with treatment as outlined.  All questions were answered. The patient knows to call the clinic with any problems, questions or concerns. We can certainly see the patient much sooner if necessary.  Patient and plan discussed with Dr. Ancil Linsey and she is in agreement with the aforementioned.   This note is electronically signed by: Doy Mince 07/21/2015 10:58 AM

## 2015-07-21 NOTE — Patient Instructions (Signed)
.  [facility] Discharge Instructions for Patients Receiving Chemotherapy  Today you received the following chemotherapy agents zometa Follow up as scheduled  To help prevent nausea and vomiting after your treatment, we encourage you to take your nausea medication    If you develop nausea and vomiting, or diarrhea that is not controlled by your medication, call the clinic.  The clinic phone number is [phone]. Office hours are Monday-Friday 8:30am-5:00pm.  BELOW ARE SYMPTOMS THAT SHOULD BE REPORTED IMMEDIATELY:  *FEVER GREATER THAN 101.0 F  *CHILLS WITH OR WITHOUT FEVER  NAUSEA AND VOMITING THAT IS NOT CONTROLLED WITH YOUR NAUSEA MEDICATION  *UNUSUAL SHORTNESS OF BREATH  *UNUSUAL BRUISING OR BLEEDING  TENDERNESS IN MOUTH AND THROAT WITH OR WITHOUT PRESENCE OF ULCERS  *URINARY PROBLEMS  *BOWEL PROBLEMS  UNUSUAL RASH Items with * indicate a potential emergency and should be followed up as soon as possible. If you have an emergency after office hours please contact your primary care physician or go to the nearest emergency department.  Please call the clinic during office hours if you have any questions or concerns.   You may also contact the Patient Navigator at [phone] should you have any questions or need assistance in obtaining follow up care. _____________________________________________________________________ Have you asked about our STAR program?    STAR stands for Survivorship Training and Rehabilitation, and this is a nationally recognized cancer care program that focuses on survivorship and rehabilitation.  Cancer and cancer treatments may cause problems, such as, pain, making you feel tired and keeping you from doing the things that you need or want to do. Cancer rehabilitation can help. Our goal is to reduce these troubling effects and help you have the best quality of life possible.  You may receive a survey from a nurse that asks questions  about your current state of health.  Based on the survey results, all eligible patients will be referred to the [facility] program for an evaluation so we can better serve you! A frequently asked questions sheet is available upon request.

## 2015-07-21 NOTE — Patient Instructions (Addendum)
[place] at [facility] Discharge Instructions  RECOMMENDATIONS MADE BY THE CONSULTANT AND ANY TEST RESULTS WILL BE SENT TO YOUR REFERRING PHYSICIAN.  Exam and discussion by Robynn Pane, PA- C. Will write order for Loratidine 10 mg daily. You are doing well Need to make sure she is taking calcium and vitamin D every day Zometa today  Labs and office visit in 4 weeks Labs and Zometa in 8 weeks.  Thank you for choosing [place] at [facility] to provide your oncology and hematology care.  To afford each patient quality time with our provider, please arrive at least 15 minutes before your scheduled appointment time.    You need to re-schedule your appointment should you arrive 10 or more minutes late.  We strive to give you quality time with our providers, and arriving late affects you and other patients whose appointments are after yours.  Also, if you no show three or more times for appointments you may be dismissed from the clinic at the providers discretion.     Again, thank you for choosing [facility].  Our hope is that these requests will decrease the amount of time that you wait before being seen by our physicians.       _____________________________________________________________  Should you have questions after your visit to [facility], please contact our office at [phone] between the hours of 8:30 a.m. and 4:30 p.m.  Voicemails left after 4:30 p.m. will not be returned until the following business day.  For prescription refill requests, have your pharmacy contact our office.

## 2015-07-21 NOTE — Progress Notes (Signed)
LABS DRAWN

## 2015-07-21 NOTE — Assessment & Plan Note (Addendum)
IgG multiple myeloma presenting with severe anemia.   BMBX on 02/08/2015 with plasma cell neoplasm at 58% plasma cells.  Currently on Revlimid/Dex and 1 mg of Coumadin daily.  Additionally, she is on Zometa monthly.  Supportive therapy plan reviewed.  She is due for Zometa today.  Corrected calcium is 8.9.  COmpliance with Ca++ and Vit D encouraged.  Anemia is improving significantly. Her Hgb is 11.4 g/dL today.  Minimal thrombocytopenia is noted.  No changes needed at this time.   Labs in 4 weeks: CBC diff, CMET, LDH, ESR, CRP, MM panel, B2M  Return in 4 weeks for follow-up.

## 2015-07-22 LAB — MULTIPLE MYELOMA PANEL, SERUM
ALBUMIN SERPL ELPH-MCNC: 3.4 g/dL (ref 2.9–4.4)
ALPHA 1: 0.3 g/dL (ref 0.0–0.4)
Albumin/Glob SerPl: 0.9 (ref 0.7–1.7)
Alpha2 Glob SerPl Elph-Mcnc: 0.7 g/dL (ref 0.4–1.0)
B-Globulin SerPl Elph-Mcnc: 0.8 g/dL (ref 0.7–1.3)
Gamma Glob SerPl Elph-Mcnc: 2 g/dL — ABNORMAL HIGH (ref 0.4–1.8)
Globulin, Total: 3.8 g/dL (ref 2.2–3.9)
IGA: 32 mg/dL — AB (ref 64–422)
IGM, SERUM: 13 mg/dL — AB (ref 26–217)
IgG (Immunoglobin G), Serum: 2554 mg/dL — ABNORMAL HIGH (ref 700–1600)
M Protein SerPl Elph-Mcnc: 1.8 g/dL — ABNORMAL HIGH
Total Protein ELP: 7.2 g/dL (ref 6.0–8.5)

## 2015-07-22 LAB — BETA 2 MICROGLOBULIN, SERUM: BETA 2 MICROGLOBULIN: 3.2 mg/L — AB (ref 0.6–2.4)

## 2015-07-22 LAB — KAPPA/LAMBDA LIGHT CHAINS
KAPPA, LAMDA LIGHT CHAIN RATIO: 4.55 — AB (ref 0.26–1.65)
Kappa free light chain: 52.64 mg/L — ABNORMAL HIGH (ref 3.30–19.40)
Lambda free light chains: 11.57 mg/L (ref 5.71–26.30)

## 2015-08-04 ENCOUNTER — Other Ambulatory Visit (HOSPITAL_COMMUNITY): Payer: Self-pay | Admitting: Oncology

## 2015-08-04 DIAGNOSIS — C9000 Multiple myeloma not having achieved remission: Secondary | ICD-10-CM

## 2015-08-04 MED ORDER — LENALIDOMIDE 10 MG PO CAPS: 10.0000 mg | ORAL_CAPSULE | Freq: Every day | ORAL | Status: DC

## 2015-08-18 ENCOUNTER — Encounter (HOSPITAL_COMMUNITY): Payer: Self-pay | Admitting: Hematology & Oncology

## 2015-08-18 ENCOUNTER — Encounter (HOSPITAL_BASED_OUTPATIENT_CLINIC_OR_DEPARTMENT_OTHER): Payer: Medicare Other

## 2015-08-18 ENCOUNTER — Encounter (HOSPITAL_COMMUNITY): Payer: Medicare Other | Attending: Family Medicine | Admitting: Hematology & Oncology

## 2015-08-18 VITALS — BP 123/55 | HR 79 | Temp 99.8°F | Resp 18 | Wt 121.9 lb

## 2015-08-18 DIAGNOSIS — C9000 Multiple myeloma not having achieved remission: Secondary | ICD-10-CM

## 2015-08-18 DIAGNOSIS — F0390 Unspecified dementia without behavioral disturbance: Secondary | ICD-10-CM | POA: Diagnosis not present

## 2015-08-18 DIAGNOSIS — M8580 Other specified disorders of bone density and structure, unspecified site: Secondary | ICD-10-CM | POA: Diagnosis not present

## 2015-08-18 DIAGNOSIS — D649 Anemia, unspecified: Secondary | ICD-10-CM | POA: Diagnosis present

## 2015-08-18 DIAGNOSIS — K5909 Other constipation: Secondary | ICD-10-CM

## 2015-08-18 DIAGNOSIS — M899 Disorder of bone, unspecified: Secondary | ICD-10-CM | POA: Diagnosis not present

## 2015-08-18 LAB — CBC WITH DIFFERENTIAL/PLATELET
Basophils Absolute: 0.1 10*3/uL (ref 0.0–0.1)
Basophils Relative: 2 %
EOS ABS: 0.1 10*3/uL (ref 0.0–0.7)
Eosinophils Relative: 3 %
HEMATOCRIT: 32.3 % — AB (ref 36.0–46.0)
HEMOGLOBIN: 11.4 g/dL — AB (ref 12.0–15.0)
LYMPHS ABS: 1.1 10*3/uL (ref 0.7–4.0)
LYMPHS PCT: 33 %
MCH: 34.2 pg — AB (ref 26.0–34.0)
MCHC: 35.3 g/dL (ref 30.0–36.0)
MCV: 97 fL (ref 78.0–100.0)
MONOS PCT: 6 %
Monocytes Absolute: 0.2 10*3/uL (ref 0.1–1.0)
NEUTROS PCT: 56 %
Neutro Abs: 1.9 10*3/uL (ref 1.7–7.7)
Platelets: 143 10*3/uL — ABNORMAL LOW (ref 150–400)
RBC: 3.33 MIL/uL — ABNORMAL LOW (ref 3.87–5.11)
RDW: 13.7 % (ref 11.5–15.5)
WBC: 3.3 10*3/uL — ABNORMAL LOW (ref 4.0–10.5)

## 2015-08-18 LAB — COMPREHENSIVE METABOLIC PANEL
ALK PHOS: 51 U/L (ref 38–126)
ALT: 14 U/L (ref 14–54)
ANION GAP: 6 (ref 5–15)
AST: 17 U/L (ref 15–41)
Albumin: 3.5 g/dL (ref 3.5–5.0)
BILIRUBIN TOTAL: 0.4 mg/dL (ref 0.3–1.2)
BUN: 21 mg/dL — ABNORMAL HIGH (ref 6–20)
CALCIUM: 8 mg/dL — AB (ref 8.9–10.3)
CO2: 26 mmol/L (ref 22–32)
CREATININE: 1 mg/dL (ref 0.44–1.00)
Chloride: 105 mmol/L (ref 101–111)
GFR, EST AFRICAN AMERICAN: 59 mL/min — AB (ref >60)
GFR, EST NON AFRICAN AMERICAN: 51 mL/min — AB (ref >60)
Glucose, Bld: 84 mg/dL (ref 65–99)
Potassium: 3.8 mmol/L (ref 3.5–5.1)
SODIUM: 137 mmol/L (ref 135–145)
TOTAL PROTEIN: 6.9 g/dL (ref 6.5–8.1)

## 2015-08-18 LAB — C-REACTIVE PROTEIN: CRP: 0.5 mg/dL (ref <1.0)

## 2015-08-18 LAB — LACTATE DEHYDROGENASE: LDH: 85 U/L — AB (ref 98–192)

## 2015-08-18 LAB — SEDIMENTATION RATE: SED RATE: 10 mm/h (ref 0–22)

## 2015-08-18 NOTE — Patient Instructions (Signed)
..  [place] at [facility] Discharge Instructions  RECOMMENDATIONS MADE BY THE CONSULTANT AND ANY TEST RESULTS WILL BE SENT TO YOUR REFERRING PHYSICIAN.  Dr. appt the same day that you get zometa in November We will give you flu shot then if you don't have fever or illness  Thank you for choosing [place] at [facility] to provide your oncology and hematology care.  To afford each patient quality time with our provider, please arrive at least 15 minutes before your scheduled appointment time.    You need to re-schedule your appointment should you arrive 10 or more minutes late.  We strive to give you quality time with our providers, and arriving late affects you and other patients whose appointments are after yours.  Also, if you no show three or more times for appointments you may be dismissed from the clinic at the providers discretion.     Again, thank you for choosing [facility].  Our hope is that these requests will decrease the amount of time that you wait before being seen by our physicians.       _____________________________________________________________  Should you have questions after your visit to [facility], please contact our office at [phone] between the hours of 8:30 a.m. and 4:30 p.m.  Voicemails left after 4:30 p.m. will not be returned until the following business day.  For prescription refill requests, have your pharmacy contact our office.

## 2015-08-18 NOTE — Progress Notes (Signed)
Janet Caprice, [name] [address] [place] [address]    DIAGNOSIS:  Multiple myeloma presenting with severe anemia IgG 10,600 mg/dl with total protein 13.4 g/dl 8.68 g/dl monoclonal protein IgG kappa Beta 2 microglobulin at 8.1 mg/L BMBX on 02/08/2015 with plasma cell neoplasm at 58% plasma cells Bone survey on 03/25/2015 with generalized osteopenia with slightly heterogeneous lucency in the shafts of the humeri and femora. No discrete sizable lytic lesions identified advanced osteoarthritis   CURRENT THERAPY: Revlimid/1 mg coumadin/dexamethasone   INTERVAL HISTORY: [name] 81 y.o. female returns for additional follow-up of her myeloma.  She has really done quite well on her therapy. Her blood work continues to improve. Her appetite is described as excellent. She continues to have problems with constipation. Her daughter is uncertain as to whether or not she gets her constipation/bowel regimen at the personal care home on a regular basis. She is to be on routine Benefiber and milk of magnesia as needed. She gave her miralax las pm.  The patient has complaints of bruising and discoloring on her toes.  Her daughter notes that she is slightly off balanced.  She has been dancing.  Her appetite is well.  She saw blood on the toilet tissue when she wiped.  She has been constipated.     MEDICAL HISTORY: Past Medical History  Diagnosis Date  . Chronic leg pain   . Poor historian   . Psychosis   . Schizoaffective disorder   . Hypertension   . High cholesterol   . Dementia   . Thrombocytopenia ([place]) 11/10/2014  . Normocytic anemia 11/10/2014  . Elevated total protein 11/10/2014  . Multiple myeloma ([place]) 02/17/2015  . Arthritis   . Osteopenia 03/31/2015    On Bone scan on 03/25/2015    has HEMORRHOIDS, INTERNAL; GERD; OTHER DYSPHAGIA; CERVICAL MUSCLE STRAIN; COLONIC POLYPS, ADENOMATOUS, HX OF; Dementia without behavioral disturbance; Altered mental status;  Essential hypertension; Routine gynecological examination; Cervicitis; Vaginal bleeding; Schizoaffective disorder, unspecified type ([place]); Vagina bleeding; Anemia; Normocytic anemia; Thrombocytopenia ([place]); Elevated total protein; Constipation; Multiple myeloma ([place]); Osteopenia; Dementia; CKD (chronic kidney disease) stage 3, GFR 30-59 ml/min; Hyperglycemia; and Generalized weakness on her problem list.     has No Known Allergies.     Current Outpatient Prescriptions on File Prior to Visit  Medication Sig Dispense Refill  . acetaminophen (TYLENOL) 500 MG tablet Take 500 mg by mouth 3 (three) times daily as needed for mild pain.     Marland Kitchen amLODipine (NORVASC) 5 MG tablet Take 1 tablet (5 mg total) by mouth daily.    . clonazePAM (KLONOPIN) 0.25 MG disintegrating tablet Take 0.25 mg by mouth at bedtime.    Marland Kitchen dexamethasone (DECADRON) 4 MG tablet Take 10 tablets (40 mg total) by mouth once a week.    . fentaNYL (DURAGESIC - DOSED MCG/HR) 12 MCG/HR Place 1 patch (12.5 mcg total) onto the skin every 3 (three) days.    . ferrous sulfate 325 (65 FE) MG tablet Take 1 tablet (325 mg total) by mouth 3 (three) times daily with meals.    . fluticasone (FLONASE) 50 MCG/ACT nasal spray Place 2 sprays into both nostrils daily.    Marland Kitchen lenalidomide (REVLIMID) 10 MG capsule Take 1 capsule (10 mg total) by mouth daily. 21 capsule 0  . loratadine (CLARITIN) 10 MG tablet Take 10 mg by mouth daily.    . magnesium hydroxide (MILK OF MAGNESIA) 400 MG/5ML suspension Take 30 mLs by mouth daily  as needed (bowel mobility).     . Melatonin 3 MG CAPS Take 1 capsule by mouth daily.    . Multiple Vitamins-Minerals (MULTIVITAMIN WITH MINERALS) tablet Take 1 tablet by mouth daily.    . polyethylene glycol (MIRALAX / GLYCOLAX) packet Take 17 g by mouth daily.    Marland Kitchen warfarin (COUMADIN) 1 MG tablet Take 1 tablet (1 mg total) by mouth daily. 30 tablet 3  . Wheat Dextrin (BENEFIBER DRINK MIX) PACK Take by mouth. Take 2 teaspoons by mouth  everyday.    . Calcium Carb-Cholecalciferol (OYSTER SHELL CALCIUM + D) 500-200 MG-UNIT TABS Take 1 capsule by mouth 2 (two) times daily.    Marland Kitchen HYDROcodone-acetaminophen (NORCO/VICODIN) 5-325 MG per tablet Take 1 tablet by mouth at bedtime as needed (pain). (Patient not taking: Reported on 07/21/2015) 20 tablet 0  . ondansetron (ZOFRAN) 8 MG tablet Take 1 tablet (8 mg total) by mouth every 8 (eight) hours as needed for nausea or vomiting. (Patient not taking: Reported on 07/21/2015)     No current facility-administered medications on file prior to visit.     SURGICAL HISTORY: Past Surgical History  Procedure Laterality Date  . Tubal ligation    . Foot surgery    . Colonoscopy  2010    RMR: 1. Normal rectum 2. Sigmoid and cecal polyp, status post snare resection described above. Remainder of the colonic mucosa appeared normal.     SOCIAL HISTORY: Social History   Social History  . Marital Status: Widowed    Spouse Name: N/A  . Number of Children: N/A  . Years of Education: N/A   Occupational History  . Not on file.   Social History Main Topics  . Smoking status: Never Smoker   . Smokeless tobacco: Not on file  . Alcohol Use: No  . Drug Use: No  . Sexual Activity: Not Currently    Birth Control/ Protection: Post-menopausal   Other Topics Concern  . Not on file   Social History Narrative    FAMILY HISTORY: Family History  Problem Relation Age of Onset  . Diabetes Mother     Review of Systems  Constitutional: Negative HENT: Negative.   Eyes: Negative.   Respiratory:   Negative. Cardiovascular: Negative.   Gastrointestinal: Positive for constipation.   Genitourinary: Negative.  Musculoskeletal: Negative.   Skin: Negative.   Neurological: Negative.   Endo/Heme/Allergies: Negative.   Psychiatric/Behavioral: Positive for memory loss.    14 point review of systems was performed and is negative except as detailed under history of present illness and above   PHYSICAL  EXAMINATION  ECOG PERFORMANCE STATUS: 1 - Symptomatic but completely ambulatory  Filed Vitals:   08/18/15 1119  BP: 123/55  Pulse: 79  Temp: 99.8 F (37.7 C)  Resp: 18    Physical Exam  Constitutional: She is oriented to person, well-developed, well-nourished, and in no distress. Well groomed. Gets onto the exam table today with no assistance. HENT:  Head: Normocephalic and atraumatic.  Nose: Nose normal.  Mouth/Throat: Oropharynx is clear and moist. No oropharyngeal exudate.  Eyes: Conjunctivae and EOM are normal. Pupils are equal, round, and reactive to light. Right eye exhibits no discharge. Left eye exhibits no discharge. No scleral icterus.  Neck: Normal range of motion. Neck supple. No tracheal deviation present. No thyromegaly present.  Cardiovascular: Normal rate, regular rhythm and normal heart sounds.  Exam reveals no gallop and no friction rub.   No murmur heard. Pulmonary/Chest: Effort normal and breath sounds normal. She  has no wheezes. She has no rales.  Abdominal: Soft. Bowel sounds are normal. She exhibits no distension and no mass. There is no rebound and no guarding.  Musculoskeletal: Normal range of motion. She exhibits no edema. Feet are examined bilaterally and without any bruising or skin abnormality. Lymphadenopathy:    She has no cervical adenopathy.  Neurological: She is alert and oriented to person. She has normal reflexes. No cranial nerve deficit. Gait normal although slow Coordination normal.  Skin: Skin is warm and dry. No rash noted. Psychiatric: Mood normal.  Nursing note and vitals reviewed.   LABORATORY DATA: I reviewed the results below.  CBC    Component Value Date/Time   WBC 3.3* 08/18/2015 1137   RBC 3.33* 08/18/2015 1137   RBC 1.98* 04/14/2015 1816   HGB 11.4* 08/18/2015 1137   HCT 32.3* 08/18/2015 1137   PLT 143* 08/18/2015 1137   MCV 97.0 08/18/2015 1137   MCH 34.2* 08/18/2015 1137   MCHC 35.3 08/18/2015 1137   RDW 13.7  08/18/2015 1137   LYMPHSABS 1.1 08/18/2015 1137   MONOABS 0.2 08/18/2015 1137   EOSABS 0.1 08/18/2015 1137   BASOSABS 0.1 08/18/2015 1137    CMP     Component Value Date/Time   NA 137 08/18/2015 1137   K 3.8 08/18/2015 1137   CL 105 08/18/2015 1137   CO2 26 08/18/2015 1137   GLUCOSE 84 08/18/2015 1137   BUN 21* 08/18/2015 1137   CREATININE 1.00 08/18/2015 1137   CALCIUM 8.0* 08/18/2015 1137   PROT 6.9 08/18/2015 1137   ALBUMIN 3.5 08/18/2015 1137   AST 17 08/18/2015 1137   ALT 14 08/18/2015 1137   ALKPHOS 51 08/18/2015 1137   BILITOT 0.4 08/18/2015 1137   GFRNONAA 51* 08/18/2015 1137   GFRAA 59* 08/18/2015 1137      BMBX on 02/08/2015 58% plasma cells, kappa restricted BONE MARROW ASPIRATE: Paucispicular, but cellular. Erythroid precursors: Overall reduced. No significant dysplasia. Granulocytic precursors: Overall reduced. No significant dysplasia. No increase in blasts. Megakaryocytes: Present and morphologically unremarkable. Lymphocytes/plasma cells: There is a marked increase in plasma cells (58%) with atypical forms including large forms and prominent nucleoli. Lymphocytes are not increased. TOUCH PREPARATIONS: Similar to aspirate smears. CLOT and BIOPSY: The core biopsy is small, but hyper cellular for age (80%). There is a marked increase in plasma cells, including large clusters. There is admixed trilineage hematopoiesis. There are no atypical lymphoid aggregates. The clot section does not contain marrow tissue. IRON STAIN: Iron stains are performed on a bone marrow aspirate smear and section of clot. The controls stained appropriately. There are no particles on the aspirate or clot section for evaluation. Storage Iron: N/A. Ringed Sideroblasts: N/A. ADDITIONAL DATA / TESTING: Cytogenetics was ordered, including FISH. Per records the patient has a M-spike with IgG kappa (8.68 g/dL).  RADIOLOGY:  CLINICAL DATA: Multiple myeloma.  EXAM: METASTATIC BONE  SURVEY  COMPARISON: Chest radiograph 11/09/2014. Head CT 06/01/2012. CT abdomen and pelvis 05/27/2012. Bilateral knee radiographs and pelvic radiograph 02/25/2012. Lumbar spine radiographs 10/14/2010.  FINDINGS: There is diffuse, generalized osteopenia. Small areas of slightly increased, rounded lucency are present in the shafts of multiple long bones, specifically the humeri and femora, without sizable discrete lytic lesions identified.  Mild-to-moderate enlargement of the cardiac silhouette is unchanged. Calcification and tortuosity are noted of the thoracic aorta. Pulmonary vascular congestion on the prior chest radiograph has resolved. Left basilar opacity has also resolved. No evidence of acute airspace consolidation, edema, pleural effusion, or pneumothorax.  Multilevel cervical disc degeneration is present, worst at C3-4. Mild-to-moderate thoracic dextroscoliosis is present. Multilevel thoracic and lumbar spondylosis are noted. Minimal vertebral body height loss in the mid thoracic spine appears chronic and may be degenerative. Lower lumbar facet arthrosis is present. Grade 1 anterolisthesis of L4 on L5 is stable to slightly increased from 2011 lumbar spine radiographs.  There is mild enlargement of the diploic space of the skull which is similar to the prior head CT without discrete lytic lesions identified.  Degenerative changes are noted involving the right greater than left glenohumeral joints. Moderate to severe osteoarthrosis is present involving the medial compartments of the left greater than right knees. Severe right moderate left hip osteoarthrosis are present.  IMPRESSION: 1. Generalized osteopenia with slightly heterogeneous lucency in the shafts of the humeri and femora. No discrete, sizable lytic lesions identified. 2. No acute fracture. 3. Advanced osteoarthrosis involving the shoulders, hips, and knees.   Electronically Signed  By: Logan Bores  On: 03/25/2015 14:13  ASSESSMENT and THERAPY PLAN:   Multiple myeloma, IgG kappa Profound anemia Dementia  She is on revlimid/dex and 1 mg coumadin. This regimen was chosen for tolerance and convenience as the patient depends upon her working daughter for transportation. She is doing well in regards to tolerance. Total protein is finally declining, myeloma labs are improving. Anemia is markedly improved.She is overall doing quite well and we will continue with the treatment plan as outlined.   Osteopenia/lytic bone disease  She was started on Zometa 3 mg on 03/31/2015 with no problems with tolerance. This will be continued bi-monthly. Risks and benefits of this medication were discussed in detail with the patient's daughter prior to initiation  She will need to increase her calcium to tid dosing. We will continue to monitor her calcium levels.  Constipation  This is a chronic issue. I have advised them to start her on Senokot-S twice daily and MiraLAX powder in 8 ounces of fluid daily. They can adjust as needed. If her constipation continues to be a major issue we could consider GI referral.  Her daughter is not sure if the patient gets her miralax as prescribed at her residence home.  Follow up in 1 month. At her next visit we will move her office visits out to every 2 months with myeloma studies.  All questions were answered. The patient knows to call the clinic with any problems, questions or concerns. We can certainly see the patient much sooner if necessary.   She will need her flu shot and this will be given at follow-up.  This document serves as a record of services personally performed by Ancil Linsey, MD. It was created on her behalf by Janace Hoard, a trained medical scribe. The creation of this record is based on the scribe's personal observations and the provider's statements to them. This document has been checked and approved by the attending provider.  I  have reviewed the above documentation for accuracy and completeness, and I agree with the above.  This note was electronically signed  Kelby Fam. Penland MD

## 2015-08-19 LAB — MULTIPLE MYELOMA PANEL, SERUM
ALBUMIN SERPL ELPH-MCNC: 3.4 g/dL (ref 2.9–4.4)
Albumin/Glob SerPl: 1.1 (ref 0.7–1.7)
Alpha 1: 0.2 g/dL (ref 0.0–0.4)
Alpha2 Glob SerPl Elph-Mcnc: 0.6 g/dL (ref 0.4–1.0)
B-Globulin SerPl Elph-Mcnc: 0.8 g/dL (ref 0.7–1.3)
Gamma Glob SerPl Elph-Mcnc: 1.7 g/dL (ref 0.4–1.8)
Globulin, Total: 3.2 g/dL (ref 2.2–3.9)
IGM, SERUM: 11 mg/dL — AB (ref 26–217)
IgG (Immunoglobin G), Serum: 1869 mg/dL — ABNORMAL HIGH (ref 700–1600)
M Protein SerPl Elph-Mcnc: 1.5 g/dL — ABNORMAL HIGH
TOTAL PROTEIN ELP: 6.6 g/dL (ref 6.0–8.5)

## 2015-08-19 LAB — BETA 2 MICROGLOBULIN, SERUM: Beta-2 Microglobulin: 2.8 mg/L — ABNORMAL HIGH (ref 0.6–2.4)

## 2015-08-19 LAB — KAPPA/LAMBDA LIGHT CHAINS
KAPPA, LAMDA LIGHT CHAIN RATIO: 4.31 — AB (ref 0.26–1.65)
Kappa free light chain: 42.73 mg/L — ABNORMAL HIGH (ref 3.30–19.40)
LAMDA FREE LIGHT CHAINS: 9.92 mg/L (ref 5.71–26.30)

## 2015-08-19 NOTE — Progress Notes (Signed)
Labs drawn

## 2015-08-30 ENCOUNTER — Encounter (HOSPITAL_COMMUNITY): Payer: Self-pay | Admitting: *Deleted

## 2015-08-30 NOTE — Progress Notes (Signed)
[name] was started on Coumadin/Warfarin 1mg  tablet in April 2016 for venous thromboembolism (VTE) prophylaxis. She does not need any INR monitoring for this dosage of Coumadin/Warfarin.

## 2015-09-01 ENCOUNTER — Other Ambulatory Visit (HOSPITAL_COMMUNITY): Payer: Self-pay | Admitting: Oncology

## 2015-09-01 DIAGNOSIS — C9000 Multiple myeloma not having achieved remission: Secondary | ICD-10-CM

## 2015-09-01 MED ORDER — LENALIDOMIDE 10 MG PO CAPS: 10.0000 mg | ORAL_CAPSULE | Freq: Every day | ORAL | Status: DC

## 2015-09-03 ENCOUNTER — Other Ambulatory Visit (HOSPITAL_COMMUNITY): Payer: Self-pay | Admitting: Oncology

## 2015-09-03 DIAGNOSIS — C9000 Multiple myeloma not having achieved remission: Secondary | ICD-10-CM

## 2015-09-03 MED ORDER — LENALIDOMIDE 10 MG PO CAPS: 10.0000 mg | ORAL_CAPSULE | Freq: Every day | ORAL | Status: DC

## 2015-09-13 NOTE — Assessment & Plan Note (Addendum)
IgG multiple myeloma presenting with severe anemia.   BMBX on 02/08/2015 with plasma cell neoplasm at 58% plasma cells.  Currently on Revlimid/Dex and 1 mg of Coumadin daily.  Additionally, she is on Zometa every 2 months.  Supportive therapy plan reviewed.  She is scheduled for Zometa today with a corrected calcium of 9.3.  Compliance with Ca++ and Vit D encouraged.  Calcium is TID dosing.  Labs today: CBC diff, CMET  Labs in 8 weeks: CBC diff, CMET, LDH, ESR, CRP, MM panel, B2M  She notes a "hole in my chest."  The patient's daughters laugh.  I asked her to show me this spot.  Inferior to her xyphoid process, she has an indentation when sitting where the bony aspect of the xyphoid process is predominant and upper abdomen folds underneath.  The family notes a short history of diarrhea which was exacerbated by the facility where she stays continuing Senokot-S.  The patient remarks that it is better.  We will verify that she has not had an influenza vaccine at the facility she resides at and provide her the vaccine today.  The nursing facility was unaware whether the patient received the influenza vaccine.  The patient's family is confident that she did not receive the vaccine.  Influenza vaccine was given by nursing.  She is educated on good hydration with H2O as there were questions about the patient's ability to have tea.  She is educated that tea with caffeine does not contribute to her hydration status, neither does coke (one of her favorite drinks).  Water is always best.    Return in 8 weeks for follow-up.  ADDENDUM: Patient's renal function noted to demonstrate acute renal failure with BUN 61 and Creatinine 4.07.  Unfortunately, patient received 3 mg of Zometa despite this renal function.  Patient's family advised to bring the patient to the ED for further evaluation, recheck of renal function, and possible admission for IV fluids and observation.  Of note, acute renal failure is not likely  to be secondary to multiple myeloma as response is appreciated to therapy nor her treatment as she is on low dose treatment.  Sudden change compared to 4 weeks ago is not explained from a hematologic perspective. Safety Zone is being completed by nursing at this time as Zometa is contraindicated with Mah[place]'s resulted renal function.

## 2015-09-13 NOTE — Progress Notes (Addendum)
[place] [address] [place] [address]  Multiple myeloma not having achieved remission [facility])  CURRENT THERAPY: RD and 1 mg of Coumadin daily.  INTERVAL HISTORY: [name] 81 y.o. female returns for followup of IgG multiple myeloma presenting with severe anemia.   BMBX on 02/08/2015 with plasma cell neoplasm at 58% plasma cells.    Multiple myeloma ([place])   02/08/2015 Bone Marrow Biopsy The bone marrow exhibits a marked increase in plasma cells (58% by aspirate), consistent with plasma cell myeloma.   03/05/2015 -  Chemotherapy Revlimid 10 mg days 1-21 every 28 days and 40 mg of Dexamethasone weekly.   03/25/2015 Imaging Bone survey- Generalized osteopenia with slightly heterogeneous lucency in the shafts of the humeri and femora. No discrete, sizable lytic lesions identified.    I personally reviewed and went over laboratory results with the patient.  The results are noted within this dictation.    Compliance with Coumadin is confirmed.  She is interested in an influenza vaccine and we will confirm that this has not been given at the facility she resides.  If not, we will give today.  Labs today are pending.  She is due for Zometa.  She is doing well.  She notes that she feels good.  She denies any complaints.  She does report an episode of "diarrhea" which was compounded by taking Senokot-S at the nursing home [facility].  This has since resolved.  Her daughter reports some issues with balance.  I will see what her labs demonstrate today.  Past Medical History  Diagnosis Date  . Chronic leg pain   . Poor historian   . Psychosis   . Schizoaffective disorder   . Hypertension   . High cholesterol   . Dementia   . Thrombocytopenia ([place]) 11/10/2014  . Normocytic anemia 11/10/2014  . Elevated total protein 11/10/2014  . Multiple myeloma ([place]) 02/17/2015  . Arthritis   . Osteopenia 03/31/2015    On Bone scan on 03/25/2015    has HEMORRHOIDS,  INTERNAL; GERD; OTHER DYSPHAGIA; CERVICAL MUSCLE STRAIN; COLONIC POLYPS, ADENOMATOUS, HX OF; Dementia without behavioral disturbance; Altered mental status; Essential hypertension; Routine gynecological examination; Cervicitis; Vaginal bleeding; Schizoaffective disorder, unspecified type ([place]); Vagina bleeding; Anemia; Normocytic anemia; Thrombocytopenia ([place]); Elevated total protein; Constipation; Multiple myeloma ([place]); Osteopenia; Dementia; CKD (chronic kidney disease) stage 3, GFR 30-59 ml/min; Hyperglycemia; and Generalized weakness on her problem list.     has No Known Allergies.  No current facility-administered medications on file prior to visit.   Current Outpatient Prescriptions on File Prior to Visit  Medication Sig Dispense Refill  . acetaminophen (TYLENOL) 500 MG tablet Take 500 mg by mouth 3 (three) times daily as needed for mild pain.     Marland Kitchen amLODipine (NORVASC) 5 MG tablet Take 1 tablet (5 mg total) by mouth daily.    . Calcium Carb-Cholecalciferol (OYSTER SHELL CALCIUM + D) 500-200 MG-UNIT TABS Take 1 capsule by mouth 2 (two) times daily.    . clonazePAM (KLONOPIN) 0.25 MG disintegrating tablet Take 0.25 mg by mouth at bedtime.    Marland Kitchen dexamethasone (DECADRON) 4 MG tablet Take 10 tablets (40 mg total) by mouth once a week.    . fentaNYL (DURAGESIC - DOSED MCG/HR) 12 MCG/HR Place 1 patch (12.5 mcg total) onto the skin every 3 (three) days.    . ferrous sulfate 325 (65 FE) MG tablet Take 1 tablet (325 mg total) by mouth 3 (three)  times daily with meals.    . fluticasone (FLONASE) 50 MCG/ACT nasal spray Place 2 sprays into both nostrils daily.    Marland Kitchen HYDROcodone-acetaminophen (NORCO/VICODIN) 5-325 MG per tablet Take 1 tablet by mouth at bedtime as needed (pain). 20 tablet 0  . lenalidomide (REVLIMID) 10 MG capsule Take 1 capsule (10 mg total) by mouth daily. 21 capsule 0  . loratadine (CLARITIN) 10 MG tablet Take 10 mg by mouth daily.    . magnesium hydroxide (MILK OF MAGNESIA) 400 MG/5ML  suspension Take 30 mLs by mouth daily as needed (bowel mobility).     . Melatonin 3 MG CAPS Take 1 capsule by mouth daily.    . Multiple Vitamins-Minerals (MULTIVITAMIN WITH MINERALS) tablet Take 1 tablet by mouth daily.    . ondansetron (ZOFRAN) 8 MG tablet Take 1 tablet (8 mg total) by mouth every 8 (eight) hours as needed for nausea or vomiting.    . polyethylene glycol (MIRALAX / GLYCOLAX) packet Take 17 g by mouth daily.    Marland Kitchen warfarin (COUMADIN) 1 MG tablet Take 1 tablet (1 mg total) by mouth daily. 30 tablet 3  . Wheat Dextrin (BENEFIBER DRINK MIX) PACK Take by mouth. Take 2 teaspoons by mouth everyday.      Past Surgical History  Procedure Laterality Date  . Tubal ligation    . Foot surgery    . Colonoscopy  2010    RMR: 1. Normal rectum 2. Sigmoid and cecal polyp, status post snare resection described above. Remainder of the colonic mucosa appeared normal.     Denies any headaches, dizziness, double vision, fevers, chills, night sweats, nausea, vomiting, diarrhea, constipation, chest pain, heart palpitations, shortness of breath, blood in stool, black tarry stool, urinary pain, urinary burning, urinary frequency, hematuria.   PHYSICAL EXAMINATION  ECOG PERFORMANCE STATUS: 1 - Symptomatic but completely ambulatory  There were no vitals filed for this visit.  GENERAL:alert, no distress, well nourished, well developed, comfortable, cooperative, smiling, pleasantly confused/demented, and accompanied by daughters. SKIN: skin color, texture, turgor are normal, no rashes or significant lesions HEAD: Normocephalic, No masses, lesions, tenderness or abnormalities EYES: normal, PERRLA, EOMI, Conjunctiva are pink and non-injected EARS: External ears normal OROPHARYNX:lips, buccal mucosa, and tongue normal and mucous membranes are moist  NECK: supple, no adenopathy, thyroid normal size, non-tender, without nodularity, no stridor, non-tender, trachea midline LYMPH:  no palpable  lymphadenopathy BREAST:not examined LUNGS: clear to auscultation  HEART: regular rate & rhythm ABDOMEN:abdomen soft, non-tender and normal bowel sounds BACK: Back symmetric, no curvature., No CVA tenderness EXTREMITIES:less then 2 second capillary refill, no joint deformities, effusion, or inflammation, no skin discoloration, no cyanosis  NEURO: alert & oriented x 3 with fluent speech, no focal motor/sensory deficits, gait normal    LABORATORY DATA: CBC    Component Value Date/Time   WBC 3.9* 09/15/2015 1108   RBC 3.41* 09/15/2015 1108   RBC 1.98* 04/14/2015 1816   HGB 11.7* 09/15/2015 1108   HCT 33.3* 09/15/2015 1108   PLT 131* 09/15/2015 1108   MCV 97.7 09/15/2015 1108   MCH 34.3* 09/15/2015 1108   MCHC 35.1 09/15/2015 1108   RDW 14.1 09/15/2015 1108   LYMPHSABS 1.1 09/15/2015 1108   MONOABS 0.2 09/15/2015 1108   EOSABS 0.1 09/15/2015 1108   BASOSABS 0.0 09/15/2015 1108      Chemistry      Component Value Date/Time   NA 136 09/15/2015 1108   K 3.8 09/15/2015 1108   CL 107 09/15/2015 1108   CO2  17* 09/15/2015 1108   BUN 61* 09/15/2015 1108   CREATININE 4.07* 09/15/2015 1108      Component Value Date/Time   CALCIUM 8.2* 09/15/2015 1108   ALKPHOS 66 09/15/2015 1108   AST 19 09/15/2015 1108   ALT 11* 09/15/2015 1108   BILITOT 0.6 09/15/2015 1108     Lab Results  Component Value Date   PROT 6.5 09/15/2015   ALBUMINELP 3.1 05/26/2015   A1GS 0.3 05/26/2015   A2GS 0.6 05/26/2015   BETS 0.7 05/26/2015   BETA2SER 1.0* 01/20/2015   GAMS 2.5* 05/26/2015   MSPIKE 2.5* 05/26/2015   SPEI Comment 05/26/2015   SPECOM Comment 05/26/2015   IGGSERUM 1869* 08/18/2015   IGA <50* 08/18/2015   IGMSERUM 11* 08/18/2015   IMMELINT (NOTE) 01/20/2015   KPAFRELGTCHN 42.73* 08/18/2015   LAMBDASER 9.92 08/18/2015   KAPLAMBRATIO 4.31* 08/18/2015    PENDING LABS:   RADIOGRAPHIC STUDIES:  No results found.   PATHOLOGY:    ASSESSMENT AND PLAN:  Multiple myeloma IgG  multiple myeloma presenting with severe anemia.   BMBX on 02/08/2015 with plasma cell neoplasm at 58% plasma cells.  Currently on Revlimid/Dex and 1 mg of Coumadin daily.  Additionally, she is on Zometa every 2 months.  Supportive therapy plan reviewed.  She is scheduled for Zometa today with a corrected calcium of 9.3.  Compliance with Ca++ and Vit D encouraged.  Calcium is TID dosing.  Labs today: CBC diff, CMET  Labs in 8 weeks: CBC diff, CMET, LDH, ESR, CRP, MM panel, B2M  She notes a "hole in my chest."  The patient's daughters laugh.  I asked her to show me this spot.  Inferior to her xyphoid process, she has an indentation when sitting where the bony aspect of the xyphoid process is predominant and upper abdomen folds underneath.  The family notes a short history of diarrhea which was exacerbated by the facility where she stays continuing Senokot-S.  The patient remarks that it is better.  We will verify that she has not had an influenza vaccine at the facility she resides at and provide her the vaccine today.  The nursing facility was unaware whether the patient received the influenza vaccine.  The patient's family is confident that she did not receive the vaccine.  Influenza vaccine was given by nursing.  She is educated on good hydration with H2O as there were questions about the patient's ability to have tea.  She is educated that tea with caffeine does not contribute to her hydration status, neither does coke (one of her favorite drinks).  Water is always best.    Return in 8 weeks for follow-up.  ADDENDUM: Patient's renal function noted to demonstrate acute renal failure with BUN 61 and Creatinine 4.07.  Unfortunately, patient received 3 mg of Zometa despite this renal function.  Patient's family advised to bring the patient to the ED for further evaluation, recheck of renal function, and possible admission for IV fluids and observation.  Of note, acute renal failure is not likely to  be secondary to multiple myeloma as response is appreciated to therapy nor her treatment as she is on low dose treatment.  Sudden change compared to 4 weeks ago is not explained from a hematologic perspective. Safety Zone is being completed by nursing at this time as Zometa is contraindicated with Rosette's resulted renal function.   THERAPY PLAN:  Continue with treatment as outlined.  Please note addendum above.  All questions were answered. The patient knows to  call the clinic with any problems, questions or concerns. We can certainly see the patient much sooner if necessary.  Patient and plan discussed with Dr. Ancil Linsey and she is in agreement with the aforementioned.   This note is electronically signed by: Doy Mince 09/15/2015 4:44 PM

## 2015-09-15 ENCOUNTER — Encounter (HOSPITAL_BASED_OUTPATIENT_CLINIC_OR_DEPARTMENT_OTHER): Payer: Medicare Other | Admitting: Oncology

## 2015-09-15 ENCOUNTER — Emergency Department (HOSPITAL_COMMUNITY)
Admission: EM | Admit: 2015-09-15 | Discharge: 2015-09-15 | Disposition: A | Discharge status: Home / Self Care | Payer: Medicare Other | Attending: Emergency Medicine | Admitting: Emergency Medicine

## 2015-09-15 ENCOUNTER — Encounter (HOSPITAL_BASED_OUTPATIENT_CLINIC_OR_DEPARTMENT_OTHER): Payer: Medicare Other

## 2015-09-15 ENCOUNTER — Encounter (HOSPITAL_COMMUNITY): Payer: Self-pay | Admitting: *Deleted

## 2015-09-15 ENCOUNTER — Encounter (HOSPITAL_COMMUNITY): Payer: Medicare Other | Attending: Family Medicine

## 2015-09-15 DIAGNOSIS — C9000 Multiple myeloma not having achieved remission: Secondary | ICD-10-CM

## 2015-09-15 DIAGNOSIS — Z8579 Personal history of other malignant neoplasms of lymphoid, hematopoietic and related tissues: Secondary | ICD-10-CM | POA: Insufficient documentation

## 2015-09-15 DIAGNOSIS — R944 Abnormal results of kidney function studies: Secondary | ICD-10-CM | POA: Diagnosis not present

## 2015-09-15 DIAGNOSIS — Z79899 Other long term (current) drug therapy: Secondary | ICD-10-CM | POA: Diagnosis not present

## 2015-09-15 DIAGNOSIS — Z7951 Long term (current) use of inhaled steroids: Secondary | ICD-10-CM | POA: Insufficient documentation

## 2015-09-15 DIAGNOSIS — D649 Anemia, unspecified: Secondary | ICD-10-CM | POA: Insufficient documentation

## 2015-09-15 DIAGNOSIS — N179 Acute kidney failure, unspecified: Secondary | ICD-10-CM | POA: Diagnosis not present

## 2015-09-15 DIAGNOSIS — Z7901 Long term (current) use of anticoagulants: Secondary | ICD-10-CM | POA: Diagnosis not present

## 2015-09-15 DIAGNOSIS — I10 Essential (primary) hypertension: Secondary | ICD-10-CM | POA: Diagnosis not present

## 2015-09-15 DIAGNOSIS — M1990 Unspecified osteoarthritis, unspecified site: Secondary | ICD-10-CM | POA: Insufficient documentation

## 2015-09-15 DIAGNOSIS — G8929 Other chronic pain: Secondary | ICD-10-CM | POA: Insufficient documentation

## 2015-09-15 DIAGNOSIS — Z23 Encounter for immunization: Secondary | ICD-10-CM | POA: Diagnosis not present

## 2015-09-15 DIAGNOSIS — F0390 Unspecified dementia without behavioral disturbance: Secondary | ICD-10-CM | POA: Insufficient documentation

## 2015-09-15 DIAGNOSIS — R7989 Other specified abnormal findings of blood chemistry: Secondary | ICD-10-CM | POA: Diagnosis present

## 2015-09-15 LAB — CBC WITH DIFFERENTIAL/PLATELET
BASOS ABS: 0.1 10*3/uL (ref 0.0–0.1)
BASOS PCT: 1 %
Basophils Absolute: 0 10*3/uL (ref 0.0–0.1)
Basophils Relative: 2 %
EOS PCT: 5 %
Eosinophils Absolute: 0.1 10*3/uL (ref 0.0–0.7)
Eosinophils Absolute: 0.2 10*3/uL (ref 0.0–0.7)
Eosinophils Relative: 3 %
HEMATOCRIT: 33.3 % — AB (ref 36.0–46.0)
HEMATOCRIT: 31.3 % — AB (ref 36.0–46.0)
HEMOGLOBIN: 11.7 g/dL — AB (ref 12.0–15.0)
Hemoglobin: 11 g/dL — ABNORMAL LOW (ref 12.0–15.0)
LYMPHS ABS: 1.2 10*3/uL (ref 0.7–4.0)
LYMPHS PCT: 32 %
Lymphocytes Relative: 29 %
Lymphs Abs: 1.1 10*3/uL (ref 0.7–4.0)
MCH: 34.3 pg — ABNORMAL HIGH (ref 26.0–34.0)
MCH: 34.2 pg — AB (ref 26.0–34.0)
MCHC: 35.1 g/dL (ref 30.0–36.0)
MCHC: 35.1 g/dL (ref 30.0–36.0)
MCV: 97.7 fL (ref 78.0–100.0)
MCV: 97.2 fL (ref 78.0–100.0)
MONO ABS: 0.2 10*3/uL (ref 0.1–1.0)
MONOS PCT: 5 %
Monocytes Absolute: 0.2 10*3/uL (ref 0.1–1.0)
Monocytes Relative: 6 %
NEUTROS ABS: 2.4 10*3/uL (ref 1.7–7.7)
NEUTROS ABS: 2.1 10*3/uL (ref 1.7–7.7)
NEUTROS PCT: 62 %
Neutrophils Relative %: 55 %
PLATELETS: 122 10*3/uL — AB (ref 150–400)
Platelets: 131 10*3/uL — ABNORMAL LOW (ref 150–400)
RBC: 3.41 MIL/uL — ABNORMAL LOW (ref 3.87–5.11)
RBC: 3.22 MIL/uL — AB (ref 3.87–5.11)
RDW: 14.1 % (ref 11.5–15.5)
RDW: 14.1 % (ref 11.5–15.5)
WBC: 3.9 10*3/uL — ABNORMAL LOW (ref 4.0–10.5)
WBC: 3.8 10*3/uL — ABNORMAL LOW (ref 4.0–10.5)

## 2015-09-15 LAB — COMPREHENSIVE METABOLIC PANEL
ALBUMIN: 3.3 g/dL — AB (ref 3.5–5.0)
ALK PHOS: 50 U/L (ref 38–126)
ALT: 14 U/L (ref 14–54)
ALT: 14 U/L (ref 14–54)
ANION GAP: 5 (ref 5–15)
AST: 17 U/L (ref 15–41)
AST: 17 U/L (ref 15–41)
Albumin: 3.3 g/dL — ABNORMAL LOW (ref 3.5–5.0)
Alkaline Phosphatase: 50 U/L (ref 38–126)
Anion gap: 5 (ref 5–15)
BILIRUBIN TOTAL: 0.4 mg/dL (ref 0.3–1.2)
BILIRUBIN TOTAL: 0.4 mg/dL (ref 0.3–1.2)
BUN: 21 mg/dL — AB (ref 6–20)
BUN: 21 mg/dL — AB (ref 6–20)
CALCIUM: 8.6 mg/dL — AB (ref 8.9–10.3)
CALCIUM: 8.6 mg/dL — AB (ref 8.9–10.3)
CO2: 29 mmol/L (ref 22–32)
CO2: 29 mmol/L (ref 22–32)
CREATININE: 0.96 mg/dL (ref 0.44–1.00)
CREATININE: 0.96 mg/dL (ref 0.44–1.00)
Chloride: 105 mmol/L (ref 101–111)
Chloride: 105 mmol/L (ref 101–111)
GFR calc Af Amer: >60 mL/min (ref >60)
GFR calc non Af Amer: 53 mL/min — ABNORMAL LOW (ref >60)
GFR, EST NON AFRICAN AMERICAN: 53 mL/min — AB (ref >60)
GLUCOSE: 133 mg/dL — AB (ref 65–99)
Glucose, Bld: 133 mg/dL — ABNORMAL HIGH (ref 65–99)
Potassium: 3.4 mmol/L — ABNORMAL LOW (ref 3.5–5.1)
Potassium: 3.4 mmol/L — ABNORMAL LOW (ref 3.5–5.1)
Sodium: 139 mmol/L (ref 135–145)
Sodium: 139 mmol/L (ref 135–145)
TOTAL PROTEIN: 6.5 g/dL (ref 6.5–8.1)
TOTAL PROTEIN: 6.5 g/dL (ref 6.5–8.1)

## 2015-09-15 LAB — PROTIME-INR
INR: 1.14 (ref 0.00–1.49)
PROTHROMBIN TIME: 14.8 s (ref 11.6–15.2)

## 2015-09-15 MED ORDER — SODIUM CHLORIDE 0.9 % IV SOLN: Freq: Once | INTRAVENOUS | Status: DC

## 2015-09-15 MED ORDER — SODIUM CHLORIDE 0.9 % IV SOLN
Freq: Once | INTRAVENOUS | Status: AC
  Administered 2015-09-15: 12:00:00 via INTRAVENOUS

## 2015-09-15 MED ORDER — SODIUM CHLORIDE 0.9 % IV BOLUS (SEPSIS)
500.0000 mL | Freq: Once | INTRAVENOUS | Status: AC
  Administered 2015-09-15: 500 mL via INTRAVENOUS

## 2015-09-15 MED ORDER — INFLUENZA VAC SPLIT QUAD 0.5 ML IM SUSY
0.5000 mL | PREFILLED_SYRINGE | Freq: Once | INTRAMUSCULAR | Status: AC
  Administered 2015-09-15: 0.5 mL via INTRAMUSCULAR

## 2015-09-15 MED ORDER — ZOLEDRONIC ACID 4 MG/5ML IV CONC
3.0000 mg | Freq: Once | INTRAVENOUS | Status: AC
  Administered 2015-09-15: 3 mg via INTRAVENOUS
  Filled 2015-09-15: qty 3.75

## 2015-09-15 MED ORDER — INFLUENZA VAC SPLIT QUAD 0.5 ML IM SUSY
PREFILLED_SYRINGE | INTRAMUSCULAR | Status: AC
  Filled 2015-09-15: qty 0.5

## 2015-09-15 NOTE — ED Provider Notes (Signed)
CSN: [id number]     Arrival date & time 09/15/15  1609 History   First MD Initiated Contact with Patient 09/15/15 1622     Chief Complaint  Patient presents with  . Abnormal Lab      HPI  Patient presents for evaluation of an abnormal lab. She has a history of multiple myeloma. Being getting Revlimid daily for over 9 months. Has a routine appointment today with labs. Received a call that some of her kidney function was off. In review of her chart, her renal function was normal 4 weeks ago. Today her new and was elevated 61, creatinine 4.07. She had an episode of diarrhea this morning. Otherwise not been having vomiting diarrhea or any reason to be dehydrated. Does still make urine. No edema. No shortness of breath. No changes in medications  Past Medical History  Diagnosis Date  . Chronic leg pain   . Poor historian   . Psychosis   . Schizoaffective disorder   . Hypertension   . High cholesterol   . Dementia   . Thrombocytopenia ([place]) 11/10/2014  . Normocytic anemia 11/10/2014  . Elevated total protein 11/10/2014  . Multiple myeloma ([place]) 02/17/2015  . Arthritis   . Osteopenia 03/31/2015    On Bone scan on 03/25/2015   Past Surgical History  Procedure Laterality Date  . Tubal ligation    . Foot surgery    . Colonoscopy  2010    RMR: 1. Normal rectum 2. Sigmoid and cecal polyp, status post snare resection described above. Remainder of the colonic mucosa appeared normal.    Family History  Problem Relation Age of Onset  . Diabetes Mother    Social History  Substance Use Topics  . Smoking status: Never Smoker   . Smokeless tobacco: None  . Alcohol Use: No   OB History    No data available     Review of Systems  Constitutional: Negative for fever, chills, diaphoresis, appetite change and fatigue.  HENT: Negative for mouth sores, sore throat and trouble swallowing.   Eyes: Negative for visual disturbance.  Respiratory: Negative for cough, chest tightness, shortness of  breath and wheezing.   Cardiovascular: Negative for chest pain.  Gastrointestinal: Negative for nausea, vomiting, abdominal pain, diarrhea and abdominal distention.  Endocrine: Negative for polydipsia, polyphagia and polyuria.  Genitourinary: Negative for dysuria, frequency and hematuria.  Musculoskeletal: Negative for gait problem.  Skin: Negative for color change, pallor and rash.  Neurological: Negative for dizziness, syncope, light-headedness and headaches.  Hematological: Does not bruise/bleed easily.  Psychiatric/Behavioral: Negative for behavioral problems and confusion.      Allergies  Review of patient's allergies indicates no known allergies.  Home Medications   Prior to Admission medications   Medication Sig Start Date End Date Taking? Authorizing Provider  amLODipine (NORVASC) 5 MG tablet Take 1 tablet (5 mg total) by mouth daily. 07/24/14  Yes Barton Dubois, MD  Calcium Carb-Cholecalciferol (OYSTER SHELL CALCIUM + D) 500-200 MG-UNIT TABS Take 1 capsule by mouth 2 (two) times daily.   Yes Historical Provider, MD  clonazePAM (KLONOPIN) 0.25 MG disintegrating tablet Take 0.25 mg by mouth at bedtime.   Yes Historical Provider, MD  dexamethasone (DECADRON) 4 MG tablet Take 10 tablets (40 mg total) by mouth once a week. 04/15/15  Yes Lezlie Octave Black, NP  fentaNYL (DURAGESIC - DOSED MCG/HR) 12 MCG/HR Place 1 patch (12.5 mcg total) onto the skin every 3 (three) days. 04/15/15  Yes Radene Gunning, NP  ferrous  sulfate 325 (65 FE) MG tablet Take 1 tablet (325 mg total) by mouth 3 (three) times daily with meals. 07/24/14  Yes Barton Dubois, MD  fluticasone [facility]) 50 MCG/ACT nasal spray Place 2 sprays into both nostrils daily.   Yes Historical Provider, MD  lenalidomide (REVLIMID) 10 MG capsule Take 1 capsule (10 mg total) by mouth daily. 09/03/15  Yes Manon Hilding Kefalas, PA-C  loratadine (CLARITIN) 10 MG tablet Take 10 mg by mouth daily.   Yes Historical Provider, MD  Melatonin 3 MG CAPS Take  1 capsule by mouth daily.   Yes Historical Provider, MD  Multiple Vitamins-Minerals (MULTIVITAMIN WITH MINERALS) tablet Take 1 tablet by mouth daily.   Yes Historical Provider, MD  polyethylene glycol (MIRALAX / GLYCOLAX) packet Take 17 g by mouth daily.   Yes Historical Provider, MD  senna (SENOKOT) 8.6 MG TABS tablet Take 1 tablet by mouth 2 (two) times daily.   Yes Historical Provider, MD  warfarin (COUMADIN) 1 MG tablet Take 1 tablet (1 mg total) by mouth daily. 02/17/15  Yes Thomas S Kefalas, PA-C  Wheat Dextrin (BENEFIBER DRINK MIX) PACK Take by mouth. Take 2 teaspoons by mouth everyday.   Yes Historical Provider, MD  acetaminophen (TYLENOL) 500 MG tablet Take 500 mg by mouth 3 (three) times daily as needed for mild pain.     Historical Provider, MD  HYDROcodone-acetaminophen (NORCO/VICODIN) 5-325 MG per tablet Take 1 tablet by mouth at bedtime as needed (pain). 04/15/15   Radene Gunning, NP  magnesium hydroxide (MILK OF MAGNESIA) 400 MG/5ML suspension Take 30 mLs by mouth daily as needed (bowel mobility).     Historical Provider, MD  ondansetron (ZOFRAN) 8 MG tablet Take 1 tablet (8 mg total) by mouth every 8 (eight) hours as needed for nausea or vomiting. 04/15/15   Radene Gunning, NP   BP 144/66 mmHg  Pulse 78  Temp(Src) 98.8 F (37.1 C) (Oral)  Resp 18  Ht $R'5\' 1"'Nn$  (1.549 m)  Wt 122 lb (55.339 kg)  BMI 23.06 kg/m2  SpO2 100% Physical Exam  Constitutional: She appears well-developed and well-nourished. No distress.  HENT:  Head: Normocephalic.  Eyes: Conjunctivae are normal. Pupils are equal, round, and reactive to light. No scleral icterus.  Neck: Normal range of motion. Neck supple. No thyromegaly present.  Cardiovascular: Normal rate and regular rhythm.  Exam reveals no gallop and no friction rub.   No murmur heard. Pulmonary/Chest: Effort normal and breath sounds normal. No respiratory distress. She has no wheezes. She has no rales.  Abdominal: Soft. Bowel sounds are normal. She  exhibits no distension. There is no tenderness. There is no rebound.  Musculoskeletal: Normal range of motion.  Neurological: She is alert.  Dementia  Skin: Skin is warm and dry. No rash noted.  Psychiatric: She has a normal mood and affect. Her behavior is normal.    ED Course  Procedures (including critical care time) Labs Review Labs Reviewed  CBC WITH DIFFERENTIAL/PLATELET - Abnormal; Notable for the following:    WBC 3.8 (*)    RBC 3.22 (*)    Hemoglobin 11.0 (*)    HCT 31.3 (*)    MCH 34.2 (*)    Platelets 122 (*)    All other components within normal limits  COMPREHENSIVE METABOLIC PANEL - Abnormal; Notable for the following:    Potassium 3.4 (*)    Glucose, Bld 133 (*)    BUN 21 (*)    Calcium 8.6 (*)    Albumin  3.3 (*)    GFR calc non Af Amer 53 (*)    All other components within normal limits  PROTIME-INR  URINALYSIS, ROUTINE W REFLEX MICROSCOPIC (NOT AT [facility])    Imaging Review No results found. I have personally reviewed and evaluated these images and lab results as part of my medical decision-making.   EKG Interpretation None      MDM   Final diagnoses:  Labor abnormality    Asystematic patient with abnormal kidney function on lab. We'll repeat labs to confirm, and rule out false laboratory results. The acute renal insufficiency  could be secondary to her protein load from her multiple myeloma, or Fanconi syndrome. Family is uncertain if she has been taking in fluids well because she has dementia and stays at [facility]. Some of this may be prerenal with elevation BUS as well. Her chemotherapeutic medication lists renal insufficiency less than 1%.  18:20:  420, her repeat labs returned with normal renal function. She'll be discharged home.  Tanna Furry, MD 09/15/15 [facility]

## 2015-09-15 NOTE — Discharge Instructions (Signed)
Routine follow up with your primary care physician.

## 2015-09-15 NOTE — Progress Notes (Signed)
See office visit encounter for further information.  1310:  Tolerated infusion w/o adverse reaction.  Discharged in c/o family for transport home.

## 2015-09-15 NOTE — Progress Notes (Signed)
Calcium and Albumin levels reported [name], [place].  After reviewing the labs, Marcello Moores reports that the corrected calcium is 9.3 and the patient is fine to be treated with Zometa.  This information reported to treating nurse.

## 2015-09-15 NOTE — ED Notes (Signed)
Patient was sent here for eval by cancer center for abnormal labs.

## 2015-09-15 NOTE — Patient Instructions (Signed)
[place] at [facility] Discharge Instructions  RECOMMENDATIONS MADE BY THE CONSULTANT AND ANY TEST RESULTS WILL BE SENT TO YOUR REFERRING PHYSICIAN.  Return in 8 weeks for labs, zometa, and follow up.  Continue the Revlimid and the Coumadin as ordered. You will receive Zometa today if Calcium levels are adequate.    Thank you for choosing [place] at [facility] to provide your oncology and hematology care.  To afford each patient quality time with our provider, please arrive at least 15 minutes before your scheduled appointment time.    You need to re-schedule your appointment should you arrive 10 or more minutes late.  We strive to give you quality time with our providers, and arriving late affects you and other patients whose appointments are after yours.  Also, if you no show three or more times for appointments you may be dismissed from the clinic at the providers discretion.     Again, thank you for choosing [facility].  Our hope is that these requests will decrease the amount of time that you wait before being seen by our physicians.       _____________________________________________________________  Should you have questions after your visit to [facility], please contact our office at [phone] between the hours of 8:30 a.m. and 4:30 p.m.  Voicemails left after 4:30 p.m. will not be returned until the following business day.  For prescription refill requests, have your pharmacy contact our office.

## 2015-09-17 NOTE — Progress Notes (Signed)
LABS DRAWN

## 2015-09-28 ENCOUNTER — Other Ambulatory Visit (HOSPITAL_COMMUNITY): Payer: Self-pay | Admitting: Oncology

## 2015-09-28 DIAGNOSIS — C9000 Multiple myeloma not having achieved remission: Secondary | ICD-10-CM

## 2015-09-28 MED ORDER — LENALIDOMIDE 10 MG PO CAPS: 10.0000 mg | ORAL_CAPSULE | Freq: Every day | ORAL | Status: DC

## 2015-10-11 ENCOUNTER — Ambulatory Visit (HOSPITAL_COMMUNITY): Payer: Self-pay | Admitting: Oncology

## 2015-10-13 ENCOUNTER — Other Ambulatory Visit (HOSPITAL_COMMUNITY): Payer: Self-pay

## 2015-10-13 ENCOUNTER — Encounter (HOSPITAL_COMMUNITY): Payer: Medicare Other

## 2015-10-20 ENCOUNTER — Other Ambulatory Visit (HOSPITAL_COMMUNITY): Payer: Self-pay | Admitting: Oncology

## 2015-10-20 DIAGNOSIS — C9000 Multiple myeloma not having achieved remission: Secondary | ICD-10-CM

## 2015-10-20 MED ORDER — LENALIDOMIDE 10 MG PO CAPS: 10.0000 mg | ORAL_CAPSULE | Freq: Every day | ORAL | Status: DC

## 2015-11-03 ENCOUNTER — Other Ambulatory Visit (HOSPITAL_COMMUNITY): Payer: Self-pay

## 2015-11-10 ENCOUNTER — Other Ambulatory Visit (HOSPITAL_COMMUNITY): Payer: Self-pay

## 2015-11-10 ENCOUNTER — Encounter (HOSPITAL_BASED_OUTPATIENT_CLINIC_OR_DEPARTMENT_OTHER): Payer: Medicare Other

## 2015-11-10 ENCOUNTER — Encounter (HOSPITAL_COMMUNITY): Payer: Medicare Other | Attending: Family Medicine | Admitting: Oncology

## 2015-11-10 ENCOUNTER — Encounter (HOSPITAL_COMMUNITY): Payer: Medicare Other

## 2015-11-10 ENCOUNTER — Ambulatory Visit (HOSPITAL_COMMUNITY): Payer: Self-pay | Admitting: Oncology

## 2015-11-10 ENCOUNTER — Encounter (HOSPITAL_COMMUNITY): Payer: Self-pay | Admitting: Oncology

## 2015-11-10 VITALS — BP 140/72 | HR 66 | Temp 98.9°F | Resp 18 | Wt 126.6 lb

## 2015-11-10 VITALS — BP 148/52 | HR 68 | Temp 98.1°F | Resp 16

## 2015-11-10 DIAGNOSIS — C9000 Multiple myeloma not having achieved remission: Secondary | ICD-10-CM

## 2015-11-10 DIAGNOSIS — E876 Hypokalemia: Secondary | ICD-10-CM | POA: Diagnosis not present

## 2015-11-10 DIAGNOSIS — D649 Anemia, unspecified: Secondary | ICD-10-CM | POA: Diagnosis not present

## 2015-11-10 LAB — CBC WITH DIFFERENTIAL/PLATELET
BASOS ABS: 0.1 10*3/uL (ref 0.0–0.1)
BASOS PCT: 1 %
EOS ABS: 0.2 10*3/uL (ref 0.0–0.7)
EOS PCT: 3 %
HCT: 31.9 % — ABNORMAL LOW (ref 36.0–46.0)
Hemoglobin: 11.4 g/dL — ABNORMAL LOW (ref 12.0–15.0)
Lymphocytes Relative: 19 %
Lymphs Abs: 0.9 10*3/uL (ref 0.7–4.0)
MCH: 34.8 pg — ABNORMAL HIGH (ref 26.0–34.0)
MCHC: 35.7 g/dL (ref 30.0–36.0)
MCV: 97.3 fL (ref 78.0–100.0)
MONO ABS: 0.6 10*3/uL (ref 0.1–1.0)
Monocytes Relative: 12 %
Neutro Abs: 3.1 10*3/uL (ref 1.7–7.7)
Neutrophils Relative %: 65 %
PLATELETS: 151 10*3/uL (ref 150–400)
RBC: 3.28 MIL/uL — AB (ref 3.87–5.11)
RDW: 13.6 % (ref 11.5–15.5)
WBC: 4.7 10*3/uL (ref 4.0–10.5)

## 2015-11-10 LAB — COMPREHENSIVE METABOLIC PANEL
ALBUMIN: 3.5 g/dL (ref 3.5–5.0)
ALT: 24 U/L (ref 14–54)
AST: 22 U/L (ref 15–41)
Alkaline Phosphatase: 48 U/L (ref 38–126)
Anion gap: 3 — ABNORMAL LOW (ref 5–15)
BUN: 14 mg/dL (ref 6–20)
CHLORIDE: 106 mmol/L (ref 101–111)
CO2: 29 mmol/L (ref 22–32)
Calcium: 8.9 mg/dL (ref 8.9–10.3)
Creatinine, Ser: 0.83 mg/dL (ref 0.44–1.00)
GFR calc Af Amer: >60 mL/min (ref >60)
GLUCOSE: 135 mg/dL — AB (ref 65–99)
POTASSIUM: 3.1 mmol/L — AB (ref 3.5–5.1)
Sodium: 138 mmol/L (ref 135–145)
Total Bilirubin: 0.3 mg/dL (ref 0.3–1.2)
Total Protein: 6.9 g/dL (ref 6.5–8.1)

## 2015-11-10 MED ORDER — POTASSIUM CHLORIDE CRYS ER 20 MEQ PO TBCR
20.0000 meq | EXTENDED_RELEASE_TABLET | Freq: Two times a day (BID) | ORAL | Status: DC
Start: 2015-11-10 — End: 2016-10-31

## 2015-11-10 MED ORDER — ZOLEDRONIC ACID 4 MG/5ML IV CONC
3.0000 mg | Freq: Once | INTRAVENOUS | Status: AC
  Administered 2015-11-10: 3 mg via INTRAVENOUS
  Filled 2015-11-10: qty 3.75

## 2015-11-10 MED ORDER — SODIUM CHLORIDE 0.9 % IV SOLN
Freq: Once | INTRAVENOUS | Status: AC
  Administered 2015-11-10: 16:00:00 via INTRAVENOUS

## 2015-11-10 NOTE — Progress Notes (Signed)
[place] [address] [place] [address]  Multiple myeloma not having achieved remission ([place]) - Plan: Protein electrophoresis, urine, CBC with Differential, Comprehensive metabolic panel, Lactate dehydrogenase, Sedimentation rate, C-reactive protein, Kappa/lambda light chains, Beta 2 microglobuline, serum, IgG, IgA, IgM, Immunofixation electrophoresis, Protein electrophoresis, serum, Uric acid  Hypokalemia - Plan: potassium chloride SA (K-DUR,KLOR-CON) 20 MEQ tablet  CURRENT THERAPY: RD and 1 mg of Coumadin daily and Zometa every 8 weeks  INTERVAL HISTORY: [name] 81 y.o. female returns for followup of IgG multiple myeloma presenting with severe anemia.   BMBX on 02/08/2015 with plasma cell neoplasm at 58% plasma cells.    Multiple myeloma ([place])   02/08/2015 Bone Marrow Biopsy The bone marrow exhibits a marked increase in plasma cells (58% by aspirate), consistent with plasma cell myeloma.   03/05/2015 -  Chemotherapy Revlimid 10 mg days 1-21 every 28 days and 40 mg of Dexamethasone weekly.   03/25/2015 Imaging Bone survey- Generalized osteopenia with slightly heterogeneous lucency in the shafts of the humeri and femora. No discrete, sizable lytic lesions identified.    I personally reviewed and went over laboratory results with the patient.  The results are noted within this dictation.   Compliance with Coumadin is confirmed.  On her last encounter, the patient was given Zometa in the setting of labs demonstrating acute renal failure.  She was advised to report to the ED after being discharged from the clinic.  In the ED, repeat labs demonstrated normal renal function.  As a result, lab error occurred.  Her account should have been credited after discussion with Buyer, retail.  The patient is doing well.  She notes a right upper eyelid stye.  She notes that she has had them in the past.  She otherwise denies any complaints.  The patient's  daughter bring a urine test result that demonstrated moderate amount of calcium oxylate and uric crystals in her urine.   They are concerned about this.  I have printed some information regarding these crystals.  Treatment is simply supportive and some dietary modifications can be utilized.  However, given her dementia, it may not be reasonable to limit foods she eats.  I have encouraged increased H2O and decrease soda and tea intake.   Past Medical History  Diagnosis Date  . Chronic leg pain   . Poor historian   . Psychosis   . Schizoaffective disorder   . Hypertension   . High cholesterol   . Dementia   . Thrombocytopenia ([place]) 11/10/2014  . Normocytic anemia 11/10/2014  . Elevated total protein 11/10/2014  . Multiple myeloma ([place]) 02/17/2015  . Arthritis   . Osteopenia 03/31/2015    On Bone scan on 03/25/2015    has HEMORRHOIDS, INTERNAL; GERD; OTHER DYSPHAGIA; CERVICAL MUSCLE STRAIN; COLONIC POLYPS, ADENOMATOUS, HX OF; Dementia without behavioral disturbance; Altered mental status; Essential hypertension; Routine gynecological examination; Cervicitis; Vaginal bleeding; Schizoaffective disorder, unspecified type ([place]); Vagina bleeding; Anemia; Normocytic anemia; Thrombocytopenia ([place]); Elevated total protein; Constipation; Multiple myeloma ([place]); Osteopenia; Dementia; CKD (chronic kidney disease) stage 3, GFR 30-59 ml/min; Hyperglycemia; and Generalized weakness on her problem list.     has No Known Allergies.  Current Outpatient Prescriptions on File Prior to Visit  Medication Sig Dispense Refill  . acetaminophen (TYLENOL) 500 MG tablet Take 500 mg by mouth 3 (three) times daily as needed for mild pain.     Marland Kitchen amLODipine (NORVASC) 5 MG tablet Take 1 tablet (  5 mg total) by mouth daily.    . Calcium Carb-Cholecalciferol (OYSTER SHELL CALCIUM + D) 500-200 MG-UNIT TABS Take 1 capsule by mouth 2 (two) times daily.    . clonazePAM (KLONOPIN) 0.25 MG disintegrating tablet Take 0.25 mg by mouth  at bedtime.    Marland Kitchen dexamethasone (DECADRON) 4 MG tablet Take 10 tablets (40 mg total) by mouth once a week.    . fentaNYL (DURAGESIC - DOSED MCG/HR) 12 MCG/HR Place 1 patch (12.5 mcg total) onto the skin every 3 (three) days.    . ferrous sulfate 325 (65 FE) MG tablet Take 1 tablet (325 mg total) by mouth 3 (three) times daily with meals.    . fluticasone (FLONASE) 50 MCG/ACT nasal spray Place 2 sprays into both nostrils daily.    Marland Kitchen HYDROcodone-acetaminophen (NORCO/VICODIN) 5-325 MG per tablet Take 1 tablet by mouth at bedtime as needed (pain). 20 tablet 0  . lenalidomide (REVLIMID) 10 MG capsule Take 1 capsule (10 mg total) by mouth daily. 21 capsule 0  . loratadine (CLARITIN) 10 MG tablet Take 10 mg by mouth daily.    . Melatonin 3 MG CAPS Take 1 capsule by mouth daily.    . Multiple Vitamins-Minerals (MULTIVITAMIN WITH MINERALS) tablet Take 1 tablet by mouth daily.    . polyethylene glycol (MIRALAX / GLYCOLAX) packet Take 17 g by mouth daily.    Marland Kitchen senna (SENOKOT) 8.6 MG TABS tablet Take 1 tablet by mouth 2 (two) times daily.    Marland Kitchen warfarin (COUMADIN) 1 MG tablet Take 1 tablet (1 mg total) by mouth daily. 30 tablet 3  . Wheat Dextrin (BENEFIBER DRINK MIX) PACK Take by mouth. Take 2 teaspoons by mouth everyday.    . magnesium hydroxide (MILK OF MAGNESIA) 400 MG/5ML suspension Take 30 mLs by mouth daily as needed (bowel mobility). Reported on 11/10/2015    . ondansetron (ZOFRAN) 8 MG tablet Take 1 tablet (8 mg total) by mouth every 8 (eight) hours as needed for nausea or vomiting. (Patient not taking: Reported on 11/10/2015)     No current facility-administered medications on file prior to visit.    Past Surgical History  Procedure Laterality Date  . Tubal ligation    . Foot surgery    . Colonoscopy  2010    RMR: 1. Normal rectum 2. Sigmoid and cecal polyp, status post snare resection described above. Remainder of the colonic mucosa appeared normal.     Denies any headaches, dizziness,  double vision, fevers, chills, night sweats, nausea, vomiting, diarrhea, constipation, chest pain, heart palpitations, shortness of breath, blood in stool, black tarry stool, urinary pain, urinary burning, urinary frequency, hematuria.   PHYSICAL EXAMINATION  ECOG PERFORMANCE STATUS: 1 - Symptomatic but completely ambulatory  Filed Vitals:   11/10/15 1452  BP: 140/72  Pulse: 66  Temp: 98.9 F (37.2 C)  Resp: 18    GENERAL:alert, no distress, well nourished, well developed, comfortable, cooperative, smiling, pleasantly confused/demented, and accompanied by daughters. SKIN: skin color, texture, turgor are normal, no rashes or significant lesions HEAD: Normocephalic, No masses, lesions, tenderness or abnormalities EYES: normal, PERRLA, EOMI, Conjunctiva are pink and non-injected, right upper eyelid stye. EARS: External ears normal OROPHARYNX:lips, buccal mucosa, and tongue normal and mucous membranes are moist  NECK: supple, no adenopathy, thyroid normal size, non-tender, without nodularity, no stridor, non-tender, trachea midline LYMPH:  no palpable lymphadenopathy BREAST:not examined LUNGS: clear to auscultation  HEART: regular rate & rhythm ABDOMEN:abdomen soft, non-tender and normal bowel sounds BACK: Back symmetric, no  curvature., No CVA tenderness EXTREMITIES:less then 2 second capillary refill, no joint deformities, effusion, or inflammation, no skin discoloration, no cyanosis  NEURO: alert & oriented x 3 with fluent speech, no focal motor/sensory deficits, gait normal    LABORATORY DATA: CBC    Component Value Date/Time   WBC 4.7 11/10/2015 1325   RBC 3.28* 11/10/2015 1325   RBC 1.98* 04/14/2015 1816   HGB 11.4* 11/10/2015 1325   HCT 31.9* 11/10/2015 1325   PLT 151 11/10/2015 1325   MCV 97.3 11/10/2015 1325   MCH 34.8* 11/10/2015 1325   MCHC 35.7 11/10/2015 1325   RDW 13.6 11/10/2015 1325   LYMPHSABS 0.9 11/10/2015 1325   MONOABS 0.6 11/10/2015 1325   EOSABS 0.2  11/10/2015 1325   BASOSABS 0.1 11/10/2015 1325      Chemistry      Component Value Date/Time   NA 138 11/10/2015 1325   K 3.1* 11/10/2015 1325   CL 106 11/10/2015 1325   CO2 29 11/10/2015 1325   BUN 14 11/10/2015 1325   CREATININE 0.83 11/10/2015 1325      Component Value Date/Time   CALCIUM 8.9 11/10/2015 1325   ALKPHOS 48 11/10/2015 1325   AST 22 11/10/2015 1325   ALT 24 11/10/2015 1325   BILITOT 0.3 11/10/2015 1325     Lab Results  Component Value Date   PROT 6.9 11/10/2015   ALBUMINELP 3.1 05/26/2015   A1GS 0.3 05/26/2015   A2GS 0.6 05/26/2015   BETS 0.7 05/26/2015   BETA2SER 1.0* 01/20/2015   GAMS 2.5* 05/26/2015   MSPIKE 2.5* 05/26/2015   SPEI Comment 05/26/2015   SPECOM Comment 05/26/2015   IGGSERUM 1869* 08/18/2015   IGA <50* 08/18/2015   IGMSERUM 11* 08/18/2015   IMMELINT (NOTE) 01/20/2015   KPAFRELGTCHN 42.73* 08/18/2015   LAMBDASER 9.92 08/18/2015   KAPLAMBRATIO 4.31* 08/18/2015    PENDING LABS:   RADIOGRAPHIC STUDIES:  No results found.   PATHOLOGY:    ASSESSMENT AND PLAN:  Multiple myeloma IgG multiple myeloma presenting with severe anemia.   BMBX on 02/08/2015 with plasma cell neoplasm at 58% plasma cells.  Currently on Revlimid/Dex and 1 mg of Coumadin daily.  Additionally, she is on Zometa every 2 months.  Supportive therapy plan reviewed.  She is scheduled for Zometa today and we will see what her calcium is today prior to approving Zometa treatment today.  Compliance with Ca++ and Vit D encouraged.  Calcium is TID dosing.  Labs today: CBC diff, CMET, LDH, ESR, CRP, MM panel, B2M.  Labs in 8 weeks: CBC diff, CMET, LDH, ESR, CRP, MM panel, B2M  I have added a uric acid level to her labs as well given her uric crystals in her urine.  I have printed information regarding calcium oxylate and uric crystals.  Mainstay of treatment/management is dietary but given the patient's age and dementia, limiting foods that she enjoys would be  questionable.  Most importantly, encouraging increased H2O is recommended.  I have recommended warm compresses to her right upper eyelid stye.  If open, I recommended Neosporin or similar to the area.  Return in 8 weeks for follow-up, labs, and Zometa (labs willing).   THERAPY PLAN:  Continue with treatment as outlined.    All questions were answered. The patient knows to call the clinic with any problems, questions or concerns. We can certainly see the patient much sooner if necessary.  Patient and plan discussed with Dr. Ancil Linsey and she is in agreement with the aforementioned.  This note is electronically signed by: Doy Mince 11/10/2015 4:43 PM

## 2015-11-10 NOTE — Progress Notes (Signed)
Tolerated well

## 2015-11-10 NOTE — Assessment & Plan Note (Addendum)
IgG multiple myeloma presenting with severe anemia.   BMBX on 02/08/2015 with plasma cell neoplasm at 58% plasma cells.  Currently on Revlimid/Dex and 1 mg of Coumadin daily.  Additionally, she is on Zometa every 2 months.  Supportive therapy plan reviewed.  She is scheduled for Zometa today and we will see what her calcium is today prior to approving Zometa treatment today.  Compliance with Ca++ and Vit D encouraged.  Calcium is TID dosing.  Labs today: CBC diff, CMET, LDH, ESR, CRP, MM panel, B2M.  Labs in 8 weeks: CBC diff, CMET, LDH, ESR, CRP, MM panel, B2M  I have added a uric acid level to her labs as well given her uric crystals in her urine.  I have printed information regarding calcium oxylate and uric crystals.  Mainstay of treatment/management is dietary but given the patient's age and dementia, limiting foods that she enjoys would be questionable.  Most importantly, encouraging increased H2O is recommended.  I have recommended warm compresses to her right upper eyelid stye.  If open, I recommended Neosporin or similar to the area.  Hypokalemia is noted.  Rx printed for Kdur 40 mEq daily.  This may help decrease the calcium oxylate crystals in urine.  If uric acid is elevated, we could consider adding allopurinol to her medication regimen.  Return in 8 weeks for follow-up, labs, and Zometa (labs willing).

## 2015-11-10 NOTE — Patient Instructions (Signed)
[place] at [facility] Discharge Instructions  RECOMMENDATIONS MADE BY THE CONSULTANT AND ANY TEST RESULTS WILL BE SENT TO YOUR REFERRING PHYSICIAN.  Exam and discussion by Robynn Pane, PA-C Take Kdur (potassium) take as directed Increase your oral intake of water Information about Calcium Oxylate Crystals in urine provided Call with concerns or issues  Follow-up Labs and office visit in 2 months Zometa in 2 months.  Thank you for choosing [place] at [facility] to provide your oncology and hematology care.  To afford each patient quality time with our provider, please arrive at least 15 minutes before your scheduled appointment time.    You need to re-schedule your appointment should you arrive 10 or more minutes late.  We strive to give you quality time with our providers, and arriving late affects you and other patients whose appointments are after yours.  Also, if you no show three or more times for appointments you may be dismissed from the clinic at the providers discretion.     Again, thank you for choosing [facility].  Our hope is that these requests will decrease the amount of time that you wait before being seen by our physicians.       _____________________________________________________________  Should you have questions after your visit to [facility], please contact our office at [phone] between the hours of 8:30 a.m. and 4:30 p.m.  Voicemails left after 4:30 p.m. will not be returned until the following business day.  For prescription refill requests, have your pharmacy contact our office.

## 2015-11-11 LAB — IMMUNOFIXATION ELECTROPHORESIS
IGA: 76 mg/dL (ref 64–422)
IGG (IMMUNOGLOBIN G), SERUM: 1600 mg/dL (ref 700–1600)
IGM, SERUM: 12 mg/dL — AB (ref 26–217)
Total Protein ELP: 6.4 g/dL (ref 6.0–8.5)

## 2015-11-11 LAB — PROTEIN ELECTROPHORESIS, SERUM
A/G Ratio: 1 (ref 0.7–1.7)
ALPHA-1-GLOBULIN: 0.2 g/dL (ref 0.0–0.4)
ALPHA-2-GLOBULIN: 0.7 g/dL (ref 0.4–1.0)
Albumin ELP: 3.3 g/dL (ref 2.9–4.4)
Beta Globulin: 0.8 g/dL (ref 0.7–1.3)
GAMMA GLOBULIN: 1.5 g/dL (ref 0.4–1.8)
GLOBULIN, TOTAL: 3.2 g/dL (ref 2.2–3.9)
M-SPIKE, %: 1.3 g/dL — AB
TOTAL PROTEIN ELP: 6.5 g/dL (ref 6.0–8.5)

## 2015-11-11 LAB — KAPPA/LAMBDA LIGHT CHAINS
KAPPA, LAMDA LIGHT CHAIN RATIO: 3.53 — AB (ref 0.26–1.65)
Kappa free light chain: 43.74 mg/L — ABNORMAL HIGH (ref 3.30–19.40)
LAMDA FREE LIGHT CHAINS: 12.39 mg/L (ref 5.71–26.30)

## 2015-11-11 LAB — IGG, IGA, IGM
IGA: 76 mg/dL (ref 64–422)
IgG (Immunoglobin G), Serum: 1566 mg/dL (ref 700–1600)
IgM, Serum: 12 mg/dL — ABNORMAL LOW (ref 26–217)

## 2015-11-11 LAB — BETA 2 MICROGLOBULIN, SERUM: Beta-2 Microglobulin: 3 mg/L — ABNORMAL HIGH (ref 0.6–2.4)

## 2015-11-17 ENCOUNTER — Other Ambulatory Visit (HOSPITAL_COMMUNITY): Payer: Self-pay | Admitting: Oncology

## 2015-11-17 DIAGNOSIS — C9000 Multiple myeloma not having achieved remission: Secondary | ICD-10-CM

## 2015-11-17 MED ORDER — LENALIDOMIDE 10 MG PO CAPS: 10.0000 mg | ORAL_CAPSULE | Freq: Every day | ORAL | Status: DC

## 2015-12-08 ENCOUNTER — Other Ambulatory Visit (HOSPITAL_COMMUNITY): Payer: Self-pay

## 2015-12-08 ENCOUNTER — Ambulatory Visit (HOSPITAL_COMMUNITY): Payer: Self-pay | Admitting: Hematology & Oncology

## 2015-12-08 ENCOUNTER — Encounter (HOSPITAL_COMMUNITY): Payer: Medicare Other

## 2015-12-21 ENCOUNTER — Other Ambulatory Visit (HOSPITAL_COMMUNITY): Payer: Self-pay | Admitting: Oncology

## 2015-12-21 DIAGNOSIS — C9000 Multiple myeloma not having achieved remission: Secondary | ICD-10-CM

## 2015-12-21 MED ORDER — LENALIDOMIDE 10 MG PO CAPS: 10.0000 mg | ORAL_CAPSULE | Freq: Every day | ORAL | Status: DC

## 2016-01-05 ENCOUNTER — Encounter (HOSPITAL_COMMUNITY): Payer: Medicare Other | Attending: Family Medicine | Admitting: Hematology & Oncology

## 2016-01-05 ENCOUNTER — Encounter (HOSPITAL_COMMUNITY): Payer: Medicare Other

## 2016-01-05 ENCOUNTER — Encounter (HOSPITAL_COMMUNITY): Payer: Self-pay | Admitting: Hematology & Oncology

## 2016-01-05 VITALS — BP 145/67 | HR 78 | Temp 99.2°F | Resp 18 | Wt 120.0 lb

## 2016-01-05 DIAGNOSIS — K5900 Constipation, unspecified: Secondary | ICD-10-CM | POA: Diagnosis not present

## 2016-01-05 DIAGNOSIS — D649 Anemia, unspecified: Secondary | ICD-10-CM | POA: Insufficient documentation

## 2016-01-05 DIAGNOSIS — C9000 Multiple myeloma not having achieved remission: Secondary | ICD-10-CM

## 2016-01-05 DIAGNOSIS — E876 Hypokalemia: Secondary | ICD-10-CM | POA: Diagnosis not present

## 2016-01-05 DIAGNOSIS — M8580 Other specified disorders of bone density and structure, unspecified site: Secondary | ICD-10-CM

## 2016-01-05 LAB — CBC WITH DIFFERENTIAL/PLATELET
BASOS PCT: 1 %
Basophils Absolute: 0 10*3/uL (ref 0.0–0.1)
EOS ABS: 0.3 10*3/uL (ref 0.0–0.7)
EOS PCT: 5 %
HEMATOCRIT: 36.2 % (ref 36.0–46.0)
Hemoglobin: 12.7 g/dL (ref 12.0–15.0)
Lymphocytes Relative: 29 %
Lymphs Abs: 1.5 10*3/uL (ref 0.7–4.0)
MCH: 34 pg (ref 26.0–34.0)
MCHC: 35.1 g/dL (ref 30.0–36.0)
MCV: 97.1 fL (ref 78.0–100.0)
MONO ABS: 0.7 10*3/uL (ref 0.1–1.0)
Monocytes Relative: 12 %
Neutro Abs: 2.9 10*3/uL (ref 1.7–7.7)
Neutrophils Relative %: 53 %
PLATELETS: 135 10*3/uL — AB (ref 150–400)
RBC: 3.73 MIL/uL — ABNORMAL LOW (ref 3.87–5.11)
RDW: 13.7 % (ref 11.5–15.5)
WBC: 5.4 10*3/uL (ref 4.0–10.5)

## 2016-01-05 LAB — COMPREHENSIVE METABOLIC PANEL
ALBUMIN: 3.8 g/dL (ref 3.5–5.0)
ALT: 21 U/L (ref 14–54)
ANION GAP: 7 (ref 5–15)
AST: 17 U/L (ref 15–41)
Alkaline Phosphatase: 49 U/L (ref 38–126)
BILIRUBIN TOTAL: 0.4 mg/dL (ref 0.3–1.2)
BUN: 18 mg/dL (ref 6–20)
CO2: 28 mmol/L (ref 22–32)
Calcium: 8.8 mg/dL — ABNORMAL LOW (ref 8.9–10.3)
Chloride: 105 mmol/L (ref 101–111)
Creatinine, Ser: 1.02 mg/dL — ABNORMAL HIGH (ref 0.44–1.00)
GFR calc Af Amer: 57 mL/min — ABNORMAL LOW (ref >60)
GFR calc non Af Amer: 49 mL/min — ABNORMAL LOW (ref >60)
GLUCOSE: 93 mg/dL (ref 65–99)
POTASSIUM: 3.4 mmol/L — AB (ref 3.5–5.1)
SODIUM: 140 mmol/L (ref 135–145)
TOTAL PROTEIN: 7.2 g/dL (ref 6.5–8.1)

## 2016-01-05 LAB — SEDIMENTATION RATE: Sed Rate: 30 mm/hr — ABNORMAL HIGH (ref 0–22)

## 2016-01-05 LAB — URIC ACID: URIC ACID, SERUM: 3.7 mg/dL (ref 2.3–6.6)

## 2016-01-05 LAB — LACTATE DEHYDROGENASE: LDH: 98 U/L (ref 98–192)

## 2016-01-05 LAB — C-REACTIVE PROTEIN: CRP: 0.5 mg/dL (ref <1.0)

## 2016-01-05 NOTE — Progress Notes (Signed)
No Zometa today r/t calcium level below normal limits.

## 2016-01-05 NOTE — Patient Instructions (Addendum)
[place] at [facility] Discharge Instructions  RECOMMENDATIONS MADE BY THE CONSULTANT AND ANY TEST RESULTS WILL BE SENT TO YOUR REFERRING PHYSICIAN.     Exam and discussion by Dr Whitney Muse today Potassium was a little low, make sure pt is taking her potassium. Calcium is a little low, so we are not giving her Zometa for her bones today Zometa every 2 months with labs prior  Return to see the doctor in 2 months with labs Please call the clinic if you have any questions or concerns     Thank you for choosing [place] at [facility] to provide your oncology and hematology care.  To afford each patient quality time with our provider, please arrive at least 15 minutes before your scheduled appointment time.   Beginning January 23rd 2017 lab work for the [facility] will be done in the  Main lab at [facility] on 1st floor. If you have a lab appointment with the [place] [facility] please come in thru the  Main Entrance and check in at the main information desk  You need to re-schedule your appointment should you arrive 10 or more minutes late.  We strive to give you quality time with our providers, and arriving late affects you and other patients whose appointments are after yours.  Also, if you no show three or more times for appointments you may be dismissed from the clinic at the providers discretion.     Again, thank you for choosing [facility].  Our hope is that these requests will decrease the amount of time that you wait before being seen by our physicians.       _____________________________________________________________  Should you have questions after your visit to [facility], please contact our office at [phone] between the hours of 8:30 a.m. and 4:30 p.m.  Voicemails left after 4:30 p.m. will not be returned until the following business day.  For prescription refill requests, have your pharmacy  contact our office.

## 2016-01-05 NOTE — Progress Notes (Signed)
[place] [address] [name] [address]    DIAGNOSIS:  Multiple myeloma presenting with severe anemia IgG 10,600 mg/dl with total protein 13.4 g/dl 8.68 g/dl monoclonal protein IgG kappa Beta 2 microglobulin at 8.1 mg/L BMBX on 02/08/2015 with plasma cell neoplasm at 58% plasma cells Bone survey on 03/25/2015 with generalized osteopenia with slightly heterogeneous lucency in the shafts of the humeri and femora. No discrete sizable lytic lesions identified advanced osteoarthritis   CURRENT THERAPY: Revlimid/1 mg coumadin/dexamethasone   INTERVAL HISTORY: [name] 81 y.o. female returns for additional follow-up of her myeloma.  She has really done quite well on her therapy. Her blood work continues to improve. Her appetite is described as excellent. She continues to have problems with constipation. Her daughter is uncertain as to whether or not she gets her constipation/bowel regimen at the personal care home on a regular basis. She is to be on routine Benefiber and milk of magnesia as needed.   [name] is accompanied by her daughter and [name] today. I personally reviewed and went over laboratory studies at length with the patient and her family. Family is never sure if she is receiving her correct medications at her assisted-living facility.  Her family reports that her weight is up and down. Her [name] notes she is constantly up moving around. [name] states that the patient often remarks how the assisted living facility cannot cook. She eats very well when at home.   The patient's nose is runnyy, reporting it is just sinuses. She denies any other complaints or concerns. Weight is overall stable.   MEDICAL HISTORY: Past Medical History  Diagnosis Date  . Chronic leg pain   . Poor historian   . Psychosis   . Schizoaffective disorder   . Hypertension   . High cholesterol   . Dementia   . Thrombocytopenia ([place])  11/10/2014  . Normocytic anemia 11/10/2014  . Elevated total protein 11/10/2014  . Multiple myeloma ([place]) 02/17/2015  . Arthritis   . Osteopenia 03/31/2015    On Bone scan on 03/25/2015    has HEMORRHOIDS, INTERNAL; GERD; OTHER DYSPHAGIA; CERVICAL MUSCLE STRAIN; COLONIC POLYPS, ADENOMATOUS, HX OF; Dementia without behavioral disturbance; Altered mental status; Essential hypertension; Routine gynecological examination; Cervicitis; Vaginal bleeding; Schizoaffective disorder, unspecified type ([place]); Vagina bleeding; Anemia; Normocytic anemia; Thrombocytopenia ([place]); Elevated total protein; Constipation; Multiple myeloma ([place]); Osteopenia; Dementia; CKD (chronic kidney disease) stage 3, GFR 30-59 ml/min; Hyperglycemia; and Generalized weakness on her problem list.     has No Known Allergies.     Current Outpatient Prescriptions on File Prior to Visit  Medication Sig Dispense Refill  . acetaminophen (TYLENOL) 500 MG tablet Take 500 mg by mouth 3 (three) times daily as needed for mild pain.     Marland Kitchen amLODipine (NORVASC) 5 MG tablet Take 1 tablet (5 mg total) by mouth daily.    . Calcium Carb-Cholecalciferol (OYSTER SHELL CALCIUM + D) 500-200 MG-UNIT TABS Take 1 capsule by mouth 2 (two) times daily.    . cephALEXin (KEFLEX) 250 MG capsule Take by mouth 2 (two) times daily.    . clonazePAM (KLONOPIN) 0.25 MG disintegrating tablet Take 0.25 mg by mouth at bedtime.    Marland Kitchen dexamethasone (DECADRON) 4 MG tablet Take 10 tablets (40 mg total) by mouth once a week.    . fentaNYL (DURAGESIC - DOSED MCG/HR) 12 MCG/HR Place 1 patch (12.5 mcg total) onto the skin every 3 (three) days.    Marland Kitchen  ferrous sulfate 325 (65 FE) MG tablet Take 1 tablet (325 mg total) by mouth 3 (three) times daily with meals.    . fluticasone (FLONASE) 50 MCG/ACT nasal spray Place 2 sprays into both nostrils daily.    Marland Kitchen HYDROcodone-acetaminophen (NORCO/VICODIN) 5-325 MG per tablet Take 1 tablet by mouth at bedtime as needed (pain). 20 tablet 0  .  lenalidomide (REVLIMID) 10 MG capsule Take 1 capsule (10 mg total) by mouth daily. 21 capsule 0  . loratadine (CLARITIN) 10 MG tablet Take 10 mg by mouth daily.    . magnesium hydroxide (MILK OF MAGNESIA) 400 MG/5ML suspension Take 30 mLs by mouth daily as needed (bowel mobility). Reported on 11/10/2015    . Melatonin 3 MG CAPS Take 1 capsule by mouth daily.    . Multiple Vitamins-Minerals (MULTIVITAMIN WITH MINERALS) tablet Take 1 tablet by mouth daily.    . ondansetron (ZOFRAN) 8 MG tablet Take 1 tablet (8 mg total) by mouth every 8 (eight) hours as needed for nausea or vomiting.    . polyethylene glycol (MIRALAX / GLYCOLAX) packet Take 17 g by mouth daily.    . potassium chloride SA (K-DUR,KLOR-CON) 20 MEQ tablet Take 1 tablet (20 mEq total) by mouth 2 (two) times daily. 60 tablet 2  . senna (SENOKOT) 8.6 MG TABS tablet Take 1 tablet by mouth 2 (two) times daily.    Marland Kitchen warfarin (COUMADIN) 1 MG tablet Take 1 tablet (1 mg total) by mouth daily. 30 tablet 3  . Wheat Dextrin (BENEFIBER DRINK MIX) PACK Take by mouth. Take 2 teaspoons by mouth everyday.     No current facility-administered medications on file prior to visit.     SURGICAL HISTORY: Past Surgical History  Procedure Laterality Date  . Tubal ligation    . Foot surgery    . Colonoscopy  2010    RMR: 1. Normal rectum 2. Sigmoid and cecal polyp, status post snare resection described above. Remainder of the colonic mucosa appeared normal.     SOCIAL HISTORY: Social History   Social History  . Marital Status: Widowed    Spouse Name: N/A  . Number of Children: N/A  . Years of Education: N/A   Occupational History  . Not on file.   Social History Main Topics  . Smoking status: Never Smoker   . Smokeless tobacco: Not on file  . Alcohol Use: No  . Drug Use: No  . Sexual Activity: Not Currently    Birth Control/ Protection: Post-menopausal   Other Topics Concern  . Not on file   Social History Narrative    FAMILY  HISTORY: Family History  Problem Relation Age of Onset  . Diabetes Mother     Review of Systems  Constitutional: Positive for weight loss. HENT: Positive for runny nose.   Eyes: Negative.   Respiratory:   Negative. Cardiovascular: Negative.   Gastrointestinal: Negative.  Genitourinary: Negative.  Musculoskeletal: Negative.   Skin: Negative.   Neurological: Negative.   Endo/Heme/Allergies: Negative.   Psychiatric/Behavioral: Positive for memory loss.    14 point review of systems was performed and is negative except as detailed under history of present illness and above   PHYSICAL EXAMINATION  ECOG PERFORMANCE STATUS: 1 - Symptomatic but completely ambulatory  Filed Vitals:   01/05/16 1136  BP: 145/67  Pulse: 78  Temp: 99.2 F (37.3 C)  Resp: 18   Vitals with BMI 01/05/2016 11/10/2015 11/10/2015 09/15/2015 09/15/2015  Weight 120 lbs  126 lbs 10 oz  122  lbs   Vitals with BMI 08/18/2015 07/21/2015 07/21/2015 06/23/2015  Weight 121 lbs 14 oz  123 lbs 13 oz 114 lbs 14 oz   Physical Exam  Constitutional: She is oriented to person, well-developed, well-nourished, and in no distress. Well groomed. Gets onto the exam table today with no assistance. HENT:  Head: Normocephalic and atraumatic.  Nose: Nose normal.  Mouth/Throat: Oropharynx is clear and moist. No oropharyngeal exudate.  Eyes: Conjunctivae and EOM are normal. Pupils are equal, round, and reactive to light. Right eye exhibits no discharge. Left eye exhibits no discharge. No scleral icterus.  Neck: Normal range of motion. Neck supple. No tracheal deviation present. No thyromegaly present.  Cardiovascular: Normal rate, regular rhythm and normal heart sounds.  Exam reveals no gallop and no friction rub.   No murmur heard. Pulmonary/Chest: Effort normal and breath sounds normal. She has no wheezes. She has no rales.  Abdominal: Soft. Bowel sounds are normal. She exhibits no distension and no mass. There is no rebound and no  guarding.  Musculoskeletal: Normal range of motion. She exhibits no edema. Feet are examined bilaterally and without any bruising or skin abnormality. Lymphadenopathy:    She has no cervical adenopathy.  Neurological: She is alert and oriented to person. She has normal reflexes. No cranial nerve deficit. Gait normal although slow Coordination normal.  Skin: Skin is warm and dry. No rash noted. Psychiatric: Mood normal.  Nursing note and vitals reviewed.   LABORATORY DATA: I reviewed the results below. CBC    Component Value Date/Time   WBC 5.4 01/05/2016 1045   RBC 3.73* 01/05/2016 1045   RBC 1.98* 04/14/2015 1816   HGB 12.7 01/05/2016 1045   HCT 36.2 01/05/2016 1045   PLT 135* 01/05/2016 1045   MCV 97.1 01/05/2016 1045   MCH 34.0 01/05/2016 1045   MCHC 35.1 01/05/2016 1045   RDW 13.7 01/05/2016 1045   LYMPHSABS 1.5 01/05/2016 1045   MONOABS 0.7 01/05/2016 1045   EOSABS 0.3 01/05/2016 1045   BASOSABS 0.0 01/05/2016 1045   CMP     Component Value Date/Time   NA 140 01/05/2016 1045   K 3.4* 01/05/2016 1045   CL 105 01/05/2016 1045   CO2 28 01/05/2016 1045   GLUCOSE 93 01/05/2016 1045   BUN 18 01/05/2016 1045   CREATININE 1.02* 01/05/2016 1045   CALCIUM 8.8* 01/05/2016 1045   PROT 7.2 01/05/2016 1045   ALBUMIN 3.8 01/05/2016 1045   AST 17 01/05/2016 1045   ALT 21 01/05/2016 1045   ALKPHOS 49 01/05/2016 1045   BILITOT 0.4 01/05/2016 1045   GFRNONAA 49* 01/05/2016 1045   GFRAA 57* 01/05/2016 1045    BMBX on 02/08/2015 58% plasma cells, kappa restricted BONE MARROW ASPIRATE: Paucispicular, but cellular. Erythroid precursors: Overall reduced. No significant dysplasia. Granulocytic precursors: Overall reduced. No significant dysplasia. No increase in blasts. Megakaryocytes: Present and morphologically unremarkable. Lymphocytes/plasma cells: There is a marked increase in plasma cells (58%) with atypical forms including large forms and prominent nucleoli. Lymphocytes  are not increased. TOUCH PREPARATIONS: Similar to aspirate smears. CLOT and BIOPSY: The core biopsy is small, but hyper cellular for age (80%). There is a marked increase in plasma cells, including large clusters. There is admixed trilineage hematopoiesis. There are no atypical lymphoid aggregates. The clot section does not contain marrow tissue. IRON STAIN: Iron stains are performed on a bone marrow aspirate smear and section of clot. The controls stained appropriately. There are no particles on the aspirate or clot section  for evaluation. Storage Iron: N/A. Ringed Sideroblasts: N/A. ADDITIONAL DATA / TESTING: Cytogenetics was ordered, including FISH. Per records the patient has a M-spike with IgG kappa (8.68 g/dL).  RADIOLOGY: I have personally reviewed the radiological images as listed and agreed with the findings in the report.  CLINICAL DATA: Multiple myeloma.  EXAM: METASTATIC BONE SURVEY  COMPARISON: Chest radiograph 11/09/2014. Head CT 06/01/2012. CT abdomen and pelvis 05/27/2012. Bilateral knee radiographs and pelvic radiograph 02/25/2012. Lumbar spine radiographs 10/14/2010.  FINDINGS: There is diffuse, generalized osteopenia. Small areas of slightly increased, rounded lucency are present in the shafts of multiple long bones, specifically the humeri and femora, without sizable discrete lytic lesions identified.  Mild-to-moderate enlargement of the cardiac silhouette is unchanged. Calcification and tortuosity are noted of the thoracic aorta. Pulmonary vascular congestion on the prior chest radiograph has resolved. Left basilar opacity has also resolved. No evidence of acute airspace consolidation, edema, pleural effusion, or pneumothorax.  Multilevel cervical disc degeneration is present, worst at C3-4. Mild-to-moderate thoracic dextroscoliosis is present. Multilevel thoracic and lumbar spondylosis are noted. Minimal vertebral body height loss in the mid  thoracic spine appears chronic and may be degenerative. Lower lumbar facet arthrosis is present. Grade 1 anterolisthesis of L4 on L5 is stable to slightly increased from 2011 lumbar spine radiographs.  There is mild enlargement of the diploic space of the skull which is similar to the prior head CT without discrete lytic lesions identified.  Degenerative changes are noted involving the right greater than left glenohumeral joints. Moderate to severe osteoarthrosis is present involving the medial compartments of the left greater than right knees. Severe right moderate left hip osteoarthrosis are present.  IMPRESSION: 1. Generalized osteopenia with slightly heterogeneous lucency in the shafts of the humeri and femora. No discrete, sizable lytic lesions identified. 2. No acute fracture. 3. Advanced osteoarthrosis involving the shoulders, hips, and knees.   Electronically Signed  By: Logan Bores  On: 03/25/2015 14:13  ASSESSMENT and THERAPY PLAN:  Multiple myeloma, IgG kappa H/O Profound anemia Dementia  She is on revlimid/dex and 1 mg coumadin. This regimen was chosen for tolerance and convenience as the patient depends upon her working daughter for transportation. She is doing well in regards to tolerance. Total protein is finally declining, myeloma labs are improving. Anemia is markedly improved.She is overall doing quite well and we will continue with the treatment plan as outlined.  Last M spike was 1.3 gm/dl down from 8.68 gm/dl, it continues to improve.    Osteopenia/lytic bone disease  She was started on Zometa 3 mg on 03/31/2015 with no problems with tolerance. This will be continued bi-monthly. Risks and benefits of this medication were discussed in detail with the patient's daughter prior to initiation  She will need to increase her calcium to tid dosing. We will continue to monitor her calcium levels.  Constipation  This is a chronic issue. I have advised  them to start her on Senokot-S twice daily and MiraLAX powder in 8 ounces of fluid daily. They can adjust as needed. If her constipation continues to be a major issue we could consider GI referral.  Her daughter is not sure if the patient gets her miralax as prescribed at her residence home.  Hypokalemia  She is on Kdur 20 meq po bid.  Family is not sure she takes this routinely . They will follow-up. I have advised them to ensure she remains on her potassium as prescribed.   She will return in 2 months with  repeat PE and labs.  All questions were answered. The patient knows to call the clinic with any problems, questions or concerns. We can certainly see the patient much sooner if necessary.   This document serves as a record of services personally performed by Ancil Linsey, MD. It was created on her behalf by Arlyce Harman, a trained medical scribe. The creation of this record is based on the scribe's personal observations and the provider's statements to them. This document has been checked and approved by the attending provider.  I have reviewed the above documentation for accuracy and completeness, and I agree with the above.  This note was electronically signed  Kelby Fam. Penland MD

## 2016-01-06 LAB — PROTEIN ELECTROPHORESIS, SERUM
A/G RATIO SPE: 1.2 (ref 0.7–1.7)
ALPHA-2-GLOBULIN: 0.7 g/dL (ref 0.4–1.0)
Albumin ELP: 3.5 g/dL (ref 2.9–4.4)
Alpha-1-Globulin: 0.2 g/dL (ref 0.0–0.4)
Beta Globulin: 0.8 g/dL (ref 0.7–1.3)
GLOBULIN, TOTAL: 3 g/dL (ref 2.2–3.9)
Gamma Globulin: 1.4 g/dL (ref 0.4–1.8)
M-SPIKE, %: 1.2 g/dL — AB
TOTAL PROTEIN ELP: 6.5 g/dL (ref 6.0–8.5)

## 2016-01-06 LAB — KAPPA/LAMBDA LIGHT CHAINS
KAPPA FREE LGHT CHN: 36.18 mg/L — AB (ref 3.30–19.40)
Kappa, lambda light chain ratio: 2.18 — ABNORMAL HIGH (ref 0.26–1.65)
LAMDA FREE LIGHT CHAINS: 16.57 mg/L (ref 5.71–26.30)

## 2016-01-06 LAB — BETA 2 MICROGLOBULIN, SERUM: Beta-2 Microglobulin: 3 mg/L — ABNORMAL HIGH (ref 0.6–2.4)

## 2016-01-06 LAB — IGG, IGA, IGM
IGA: 105 mg/dL (ref 64–422)
IGG (IMMUNOGLOBIN G), SERUM: 1728 mg/dL — AB (ref 700–1600)
IgM, Serum: 27 mg/dL (ref 26–217)

## 2016-01-10 LAB — IMMUNOFIXATION ELECTROPHORESIS
IgA: 97 mg/dL (ref 64–422)
IgG (Immunoglobin G), Serum: 1729 mg/dL — ABNORMAL HIGH (ref 700–1600)
IgM, Serum: 20 mg/dL — ABNORMAL LOW (ref 26–217)
Total Protein ELP: 7.1 g/dL (ref 6.0–8.5)

## 2016-01-20 ENCOUNTER — Encounter (HOSPITAL_COMMUNITY): Payer: Self-pay | Admitting: *Deleted

## 2016-01-20 ENCOUNTER — Observation Stay (HOSPITAL_COMMUNITY)
Admission: EM | Admit: 2016-01-20 | Discharge: 2016-01-21 | Disposition: A | Discharge status: Home / Self Care | Payer: Medicare Other | Attending: Internal Medicine | Admitting: Internal Medicine

## 2016-01-20 ENCOUNTER — Emergency Department (HOSPITAL_COMMUNITY): Payer: Medicare Other

## 2016-01-20 DIAGNOSIS — R509 Fever, unspecified: Principal | ICD-10-CM | POA: Diagnosis present

## 2016-01-20 DIAGNOSIS — D696 Thrombocytopenia, unspecified: Secondary | ICD-10-CM

## 2016-01-20 DIAGNOSIS — N183 Chronic kidney disease, stage 3 unspecified: Secondary | ICD-10-CM | POA: Diagnosis present

## 2016-01-20 DIAGNOSIS — F0390 Unspecified dementia without behavioral disturbance: Secondary | ICD-10-CM

## 2016-01-20 DIAGNOSIS — J101 Influenza due to other identified influenza virus with other respiratory manifestations: Secondary | ICD-10-CM

## 2016-01-20 DIAGNOSIS — E860 Dehydration: Secondary | ICD-10-CM | POA: Diagnosis not present

## 2016-01-20 DIAGNOSIS — C9000 Multiple myeloma not having achieved remission: Secondary | ICD-10-CM | POA: Diagnosis present

## 2016-01-20 DIAGNOSIS — Z79899 Other long term (current) drug therapy: Secondary | ICD-10-CM | POA: Insufficient documentation

## 2016-01-20 DIAGNOSIS — C9002 Multiple myeloma in relapse: Secondary | ICD-10-CM

## 2016-01-20 DIAGNOSIS — I10 Essential (primary) hypertension: Secondary | ICD-10-CM | POA: Diagnosis not present

## 2016-01-20 LAB — URINALYSIS, ROUTINE W REFLEX MICROSCOPIC
Bilirubin Urine: NEGATIVE
GLUCOSE, UA: NEGATIVE mg/dL
Ketones, ur: NEGATIVE mg/dL
LEUKOCYTES UA: NEGATIVE
Nitrite: NEGATIVE
PH: 7 (ref 5.0–8.0)
Specific Gravity, Urine: 1.01 (ref 1.005–1.030)

## 2016-01-20 LAB — CBC WITH DIFFERENTIAL/PLATELET
BASOS PCT: 3 %
Basophils Absolute: 0.1 10*3/uL (ref 0.0–0.1)
EOS ABS: 0.1 10*3/uL (ref 0.0–0.7)
EOS PCT: 3 %
HCT: 36.7 % (ref 36.0–46.0)
HEMOGLOBIN: 12.8 g/dL (ref 12.0–15.0)
Lymphocytes Relative: 13 %
Lymphs Abs: 0.5 10*3/uL — ABNORMAL LOW (ref 0.7–4.0)
MCH: 34.3 pg — AB (ref 26.0–34.0)
MCHC: 34.9 g/dL (ref 30.0–36.0)
MCV: 98.4 fL (ref 78.0–100.0)
Monocytes Absolute: 0.8 10*3/uL (ref 0.1–1.0)
Monocytes Relative: 23 %
NEUTROS PCT: 58 %
Neutro Abs: 2.1 10*3/uL (ref 1.7–7.7)
PLATELETS: 109 10*3/uL — AB (ref 150–400)
RBC: 3.73 MIL/uL — AB (ref 3.87–5.11)
RDW: 14.6 % (ref 11.5–15.5)
WBC: 3.5 10*3/uL — AB (ref 4.0–10.5)

## 2016-01-20 LAB — BASIC METABOLIC PANEL
Anion gap: 6 (ref 5–15)
BUN: 18 mg/dL (ref 6–20)
CALCIUM: 8.6 mg/dL — AB (ref 8.9–10.3)
CO2: 27 mmol/L (ref 22–32)
CREATININE: 0.93 mg/dL (ref 0.44–1.00)
Chloride: 105 mmol/L (ref 101–111)
GFR, EST NON AFRICAN AMERICAN: 55 mL/min — AB (ref >60)
Glucose, Bld: 111 mg/dL — ABNORMAL HIGH (ref 65–99)
Potassium: 3.6 mmol/L (ref 3.5–5.1)
SODIUM: 138 mmol/L (ref 135–145)

## 2016-01-20 LAB — URINE MICROSCOPIC-ADD ON
Squamous Epithelial / LPF: NONE SEEN
WBC, UA: NONE SEEN WBC/hpf (ref 0–5)

## 2016-01-20 LAB — INFLUENZA PANEL BY PCR (TYPE A & B)
H1N1FLUPCR: NOT DETECTED
INFLAPCR: POSITIVE — AB
Influenza B By PCR: NEGATIVE

## 2016-01-20 LAB — LACTIC ACID, PLASMA
LACTIC ACID, VENOUS: 1.3 mmol/L (ref 0.5–2.0)
LACTIC ACID, VENOUS: 0.7 mmol/L (ref 0.5–2.0)

## 2016-01-20 LAB — MRSA PCR SCREENING: MRSA by PCR: POSITIVE — AB

## 2016-01-20 LAB — TROPONIN I

## 2016-01-20 MED ORDER — OSELTAMIVIR PHOSPHATE 30 MG PO CAPS
30.0000 mg | ORAL_CAPSULE | Freq: Two times a day (BID) | ORAL | Status: DC
  Administered 2016-01-20 – 2016-01-21 (×2): 30 mg via ORAL
  Filled 2016-01-20 (×8): qty 1

## 2016-01-20 MED ORDER — ACETAMINOPHEN 650 MG RE SUPP
650.0000 mg | Freq: Once | RECTAL | Status: AC
  Administered 2016-01-20: 650 mg via RECTAL
  Filled 2016-01-20: qty 1

## 2016-01-20 MED ORDER — ACETAMINOPHEN 325 MG PO TABS: 650.0000 mg | ORAL_TABLET | Freq: Four times a day (QID) | ORAL | Status: DC | PRN

## 2016-01-20 MED ORDER — ONDANSETRON HCL 4 MG PO TABS: 4.0000 mg | ORAL_TABLET | Freq: Four times a day (QID) | ORAL | Status: DC | PRN

## 2016-01-20 MED ORDER — POLYETHYLENE GLYCOL 3350 17 G PO PACK
17.0000 g | PACK | Freq: Every day | ORAL | Status: DC
  Administered 2016-01-20: 17 g via ORAL
  Filled 2016-01-20: qty 1

## 2016-01-20 MED ORDER — CLONAZEPAM 0.5 MG PO TABS
0.2500 mg | ORAL_TABLET | Freq: Every day | ORAL | Status: DC
  Administered 2016-01-20: 0.25 mg via ORAL
  Filled 2016-01-20: qty 1

## 2016-01-20 MED ORDER — FERROUS SULFATE 325 (65 FE) MG PO TABS
325.0000 mg | ORAL_TABLET | Freq: Three times a day (TID) | ORAL | Status: DC
  Administered 2016-01-21 (×2): 325 mg via ORAL
  Filled 2016-01-20 (×2): qty 1

## 2016-01-20 MED ORDER — SENNOSIDES-DOCUSATE SODIUM 8.6-50 MG PO TABS: 1.0000 | ORAL_TABLET | Freq: Every evening | ORAL | Status: DC | PRN

## 2016-01-20 MED ORDER — POTASSIUM CHLORIDE CRYS ER 20 MEQ PO TBCR
20.0000 meq | EXTENDED_RELEASE_TABLET | Freq: Two times a day (BID) | ORAL | Status: DC
  Administered 2016-01-20 – 2016-01-21 (×2): 20 meq via ORAL
  Filled 2016-01-20 (×2): qty 1

## 2016-01-20 MED ORDER — WARFARIN SODIUM 1 MG PO TABS
1.0000 mg | ORAL_TABLET | Freq: Every day | ORAL | Status: AC
  Administered 2016-01-20: 1 mg via ORAL
  Filled 2016-01-20: qty 1

## 2016-01-20 MED ORDER — ACETAMINOPHEN 325 MG PO TABS: 650.0000 mg | ORAL_TABLET | Freq: Once | ORAL | Status: DC

## 2016-01-20 MED ORDER — SODIUM CHLORIDE 0.9 % IV SOLN
INTRAVENOUS | Status: DC
  Administered 2016-01-20: 12:00:00 via INTRAVENOUS

## 2016-01-20 MED ORDER — LORATADINE 10 MG PO TABS
10.0000 mg | ORAL_TABLET | Freq: Every day | ORAL | Status: DC
  Administered 2016-01-20 – 2016-01-21 (×2): 10 mg via ORAL
  Filled 2016-01-20 (×2): qty 1

## 2016-01-20 MED ORDER — OSELTAMIVIR PHOSPHATE 30 MG PO CAPS
30.0000 mg | ORAL_CAPSULE | Freq: Two times a day (BID) | ORAL | Status: DC
Start: 2016-01-20 — End: 2016-01-20
  Administered 2016-01-20: 30 mg via ORAL
  Filled 2016-01-20 (×3): qty 1

## 2016-01-20 MED ORDER — GUAIFENESIN ER 600 MG PO TB12
600.0000 mg | ORAL_TABLET | Freq: Two times a day (BID) | ORAL | Status: DC
  Administered 2016-01-20 – 2016-01-21 (×2): 600 mg via ORAL
  Filled 2016-01-20 (×2): qty 1

## 2016-01-20 MED ORDER — ONDANSETRON HCL 4 MG/2ML IJ SOLN: 4.0000 mg | Freq: Four times a day (QID) | INTRAMUSCULAR | Status: DC | PRN

## 2016-01-20 MED ORDER — ACETAMINOPHEN 650 MG RE SUPP: 650.0000 mg | Freq: Four times a day (QID) | RECTAL | Status: DC | PRN

## 2016-01-20 MED ORDER — AMLODIPINE BESYLATE 5 MG PO TABS
2.5000 mg | ORAL_TABLET | Freq: Every day | ORAL | Status: DC
Start: 2016-01-20 — End: 2016-01-21
  Administered 2016-01-20 – 2016-01-21 (×2): 2.5 mg via ORAL
  Filled 2016-01-20 (×2): qty 1

## 2016-01-20 MED ORDER — ADULT MULTIVITAMIN W/MINERALS CH
1.0000 | ORAL_TABLET | Freq: Every day | ORAL | Status: DC
  Administered 2016-01-21: 1 via ORAL
  Filled 2016-01-20: qty 1

## 2016-01-20 MED ORDER — SODIUM CHLORIDE 0.9 % IV SOLN
INTRAVENOUS | Status: DC
  Administered 2016-01-20: 19:00:00 via INTRAVENOUS

## 2016-01-20 MED ORDER — FENTANYL 12 MCG/HR TD PT72
12.5000 ug | MEDICATED_PATCH | TRANSDERMAL | Status: DC
  Administered 2016-01-21: 12.5 ug via TRANSDERMAL
  Filled 2016-01-20: qty 1

## 2016-01-20 MED ORDER — SODIUM CHLORIDE 0.9 % IV SOLN
INTRAVENOUS | Status: AC
  Administered 2016-01-20: 19:00:00 via INTRAVENOUS

## 2016-01-20 MED ORDER — FLUTICASONE PROPIONATE 50 MCG/ACT NA SUSP
2.0000 | Freq: Every day | NASAL | Status: DC
  Administered 2016-01-21: 2 via NASAL
  Filled 2016-01-20: qty 16

## 2016-01-20 MED ORDER — DEXAMETHASONE 4 MG PO TABS: 40.0000 mg | ORAL_TABLET | ORAL | Status: DC

## 2016-01-20 MED ORDER — WARFARIN - PHYSICIAN DOSING INPATIENT: Freq: Every day | Status: DC

## 2016-01-20 MED ORDER — LENALIDOMIDE 10 MG PO CAPS: 10.0000 mg | ORAL_CAPSULE | Freq: Every day | ORAL | Status: DC

## 2016-01-20 NOTE — ED Notes (Signed)
Pt assisted to [facility] with 2 assist. Pt had medium size BM and voided

## 2016-01-20 NOTE — H&P (Signed)
[facility]          History and Physical    PCP:   [name], DO   EDP: Francine Graven, D.O.  Chief Complaint:  Fever  HPI: Patient is an 82 year old woman with severe dementia who resides at a skilled nursing facility who was sent to the hospital today with complaints of fever. Patient is unable to provide any history given her dementia. Grandson at bedside can only tell me that she was sent from the skilled nursing for fever and nothing else. Workup in the ED shows normal vital signs however a temperature of 101.5, essentially unremarkable labs with the exception of leukopenia and thrombocytopenia which is chronic, chest x-ray shows no evidence of pneumonia or edema. We have been asked to admit her for further evaluation and management.  Allergies:   Allergies  Allergen Reactions  . Pollen Extract     Teary eyes, runny nose.      Past Medical History  Diagnosis Date  . Chronic leg pain   . Poor historian   . Psychosis   . Schizoaffective disorder   . Hypertension   . High cholesterol   . Dementia   . Thrombocytopenia ([place]) 11/10/2014  . Normocytic anemia 11/10/2014  . Elevated total protein 11/10/2014  . Multiple myeloma ([place]) 02/17/2015  . Arthritis   . Osteopenia 03/31/2015    On Bone scan on 03/25/2015    Past Surgical History  Procedure Laterality Date  . Tubal ligation    . Foot surgery    . Colonoscopy  2010    RMR: 1. Normal rectum 2. Sigmoid and cecal polyp, status post snare resection described above. Remainder of the colonic mucosa appeared normal.     Prior to Admission medications   Medication Sig Start Date End Date Taking? Authorizing Provider  acetaminophen (TYLENOL) 325 MG tablet Take 650 mg by mouth every 4 (four) hours as needed for fever.   Yes Historical Provider, MD  amLODipine (NORVASC) 2.5 MG tablet Take 2.5 mg by mouth daily.   Yes Historical Provider, MD  calcium carbonate (TUMS - DOSED IN MG ELEMENTAL CALCIUM)  500 MG chewable tablet Chew 2 tablets by mouth daily.   Yes Historical Provider, MD  calcium citrate-vitamin D (CITRACAL+D) 315-200 MG-UNIT tablet Take 1 tablet by mouth 2 (two) times daily.   Yes Historical Provider, MD  cholecalciferol (VITAMIN D) 1000 units tablet Take 500 Units by mouth daily.   Yes Historical Provider, MD  loperamide (IMODIUM) 2 MG capsule Take 2 mg by mouth as needed for diarrhea or loose stools.   Yes Historical Provider, MD  acetaminophen (TYLENOL) 500 MG tablet Take 500 mg by mouth 3 (three) times daily as needed for mild pain.     Historical Provider, MD  cephALEXin (KEFLEX) 250 MG capsule Take by mouth 2 (two) times daily.    Historical Provider, MD  clonazePAM (KLONOPIN) 0.25 MG disintegrating tablet Take 0.25 mg by mouth at bedtime.    Historical Provider, MD  dexamethasone (DECADRON) 4 MG tablet Take 10 tablets (40 mg total) by mouth once a week. Patient taking differently: Take 40 mg by mouth once a week. Receives every Thursday. 04/15/15   Radene Gunning, NP  fentaNYL (DURAGESIC - DOSED MCG/HR) 12 MCG/HR Place 1 patch (12.5 mcg total) onto the skin every 3 (three) days. 04/15/15   Radene Gunning, NP  ferrous sulfate 325 (65 FE) MG  tablet Take 1 tablet (325 mg total) by mouth 3 (three) times daily with meals. 07/24/14   Barton Dubois, MD  fluticasone [facility]) 50 MCG/ACT nasal spray Place 2 sprays into both nostrils daily.    Historical Provider, MD  HYDROcodone-acetaminophen (NORCO/VICODIN) 5-325 MG per tablet Take 1 tablet by mouth at bedtime as needed (pain). Patient taking differently: Take 1 tablet by mouth every 12 (twelve) hours as needed (pain).  04/15/15   Radene Gunning, NP  lenalidomide (REVLIMID) 10 MG capsule Take 1 capsule (10 mg total) by mouth daily. 12/21/15   Baird Cancer, PA-C  loratadine (CLARITIN) 10 MG tablet Take 10 mg by mouth daily.    Historical Provider, MD  magnesium hydroxide (MILK OF MAGNESIA) 400 MG/5ML suspension Take 30 mLs by mouth daily as  needed (bowel mobility). Reported on 11/10/2015    Historical Provider, MD  Melatonin 3 MG CAPS Take 1 capsule by mouth daily.    Historical Provider, MD  Multiple Vitamins-Minerals (MULTIVITAMIN WITH MINERALS) tablet Take 1 tablet by mouth daily.    Historical Provider, MD  ondansetron (ZOFRAN) 8 MG tablet Take 1 tablet (8 mg total) by mouth every 8 (eight) hours as needed for nausea or vomiting. 04/15/15   Radene Gunning, NP  polyethylene glycol (MIRALAX / GLYCOLAX) packet Take 17 g by mouth at bedtime.     Historical Provider, MD  potassium chloride SA (K-DUR,KLOR-CON) 20 MEQ tablet Take 1 tablet (20 mEq total) by mouth 2 (two) times daily. 11/10/15   Baird Cancer, PA-C  senna (SENOKOT) 8.6 MG TABS tablet Take 1 tablet by mouth 2 (two) times daily.    Historical Provider, MD  warfarin (COUMADIN) 1 MG tablet Take 1 tablet (1 mg total) by mouth daily. 02/17/15   Baird Cancer, PA-C  Wheat Dextrin (BENEFIBER DRINK MIX) PACK Take by mouth. Take 2 teaspoons by mouth everyday.    Historical Provider, MD    Social History:  reports that she has never smoked. She does not have any smokeless tobacco history on file. She reports that she does not drink alcohol or use illicit drugs.  Family History  Problem Relation Age of Onset  . Diabetes Mother     Review of Systems:  Unable to obtain given current mental state  Physical Exam: Blood pressure 125/54, pulse 86, temperature 100.1 F (37.8 C), temperature source Oral, resp. rate 20, height '5\' 2"'$  (1.575 m), weight 52.708 kg (116 lb 3.2 oz), SpO2 100 %. General: Lying in bed, restless, speaks nonsensically, and able to answer questions HEENT: Normocephalic, atraumatic, pupils equal round and reactive to light, dry mucous membranes Neck: Supple, no JVD, no lymphadenopathy, no bruits, no goiter excellent cardiovascular: Regular rate and rhythm, no murmurs, rubs or gallops Lungs: Clear to auscultation bilaterally Abdomen: Soft, nontender,  nondistended, positive bowel sounds excellent extremities: No clubbing, cyanosis or edema, positive pulses Neurologic: Moves all 4 spontaneously  Labs on Admission:  Results for orders placed or performed during the hospital encounter of 01/20/16 (from the past 48 hour(s))  Urinalysis, Routine w reflex microscopic (not at [facility])     Status: Abnormal   Collection Time: 01/20/16 10:15 AM  Result Value Ref Range   Color, Urine YELLOW YELLOW   APPearance CLEAR CLEAR   Specific Gravity, Urine 1.010 1.005 - 1.030   pH 7.0 5.0 - 8.0   Glucose, UA NEGATIVE NEGATIVE mg/dL   Hgb urine dipstick TRACE (A) NEGATIVE   Bilirubin Urine NEGATIVE NEGATIVE   Ketones,  ur NEGATIVE NEGATIVE mg/dL   Protein, ur TRACE (A) NEGATIVE mg/dL   Nitrite NEGATIVE NEGATIVE   Leukocytes, UA NEGATIVE NEGATIVE  Urine microscopic-add on     Status: Abnormal   Collection Time: 01/20/16 10:15 AM  Result Value Ref Range   Squamous Epithelial / LPF NONE SEEN NONE SEEN   WBC, UA NONE SEEN 0 - 5 WBC/hpf   RBC / HPF 0-5 0 - 5 RBC/hpf   Bacteria, UA FEW (A) NONE SEEN  Lactic acid, plasma     Status: None   Collection Time: 01/20/16 11:18 AM  Result Value Ref Range   Lactic Acid, Venous 1.3 0.5 - 2.0 mmol/L  Basic metabolic panel     Status: Abnormal   Collection Time: 01/20/16 11:18 AM  Result Value Ref Range   Sodium 138 135 - 145 mmol/L   Potassium 3.6 3.5 - 5.1 mmol/L   Chloride 105 101 - 111 mmol/L   CO2 27 22 - 32 mmol/L   Glucose, Bld 111 (H) 65 - 99 mg/dL   BUN 18 6 - 20 mg/dL   Creatinine, Ser 0.93 0.44 - 1.00 mg/dL   Calcium 8.6 (L) 8.9 - 10.3 mg/dL   GFR calc non Af Amer 55 (L) >60 mL/min   GFR calc Af Amer >60 >60 mL/min    Comment: (NOTE) The eGFR has been calculated using the CKD EPI equation. This calculation has not been validated in all clinical situations. eGFR's persistently <60 mL/min signify possible Chronic Kidney Disease.    Anion gap 6 5 - 15  CBC with Differential     Status: Abnormal    Collection Time: 01/20/16 11:18 AM  Result Value Ref Range   WBC 3.5 (L) 4.0 - 10.5 K/uL   RBC 3.73 (L) 3.87 - 5.11 MIL/uL   Hemoglobin 12.8 12.0 - 15.0 g/dL   HCT 36.7 36.0 - 46.0 %   MCV 98.4 78.0 - 100.0 fL   MCH 34.3 (H) 26.0 - 34.0 pg   MCHC 34.9 30.0 - 36.0 g/dL   RDW 14.6 11.5 - 15.5 %   Platelets 109 (L) 150 - 400 K/uL   Neutrophils Relative % 58 %   Neutro Abs 2.1 1.7 - 7.7 K/uL   Lymphocytes Relative 13 %   Lymphs Abs 0.5 (L) 0.7 - 4.0 K/uL   Monocytes Relative 23 %   Monocytes Absolute 0.8 0.1 - 1.0 K/uL   Eosinophils Relative 3 %   Eosinophils Absolute 0.1 0.0 - 0.7 K/uL   Basophils Relative 3 %   Basophils Absolute 0.1 0.0 - 0.1 K/uL  Troponin I     Status: None   Collection Time: 01/20/16 11:18 AM  Result Value Ref Range   Troponin I <0.03 <0.031 ng/mL    Comment:        NO INDICATION OF MYOCARDIAL INJURY.   Influenza panel by PCR (type A & B, H1N1)     Status: Abnormal   Collection Time: 01/20/16 12:50 PM  Result Value Ref Range   Influenza A By PCR POSITIVE (A) NEGATIVE   Influenza B By PCR NEGATIVE NEGATIVE   H1N1 flu by pcr NOT DETECTED NOT DETECTED    Comment:        The Xpert Flu assay (FDA approved for nasal aspirates or washes and nasopharyngeal swab specimens), is intended as an aid in the diagnosis of influenza and should not be used as a sole basis for treatment.   Lactic acid, plasma  Status: None   Collection Time: 01/20/16  1:23 PM  Result Value Ref Range   Lactic Acid, Venous 0.7 0.5 - 2.0 mmol/L    Radiological Exams on Admission: Dg Chest Portable 1 View  01/20/2016  CLINICAL DATA:  Fever.  History of hypertension and dementia. EXAM: PORTABLE CHEST 1 VIEW COMPARISON:  04/15/2015 and 04/14/2015. FINDINGS: 1041 hours. The heart size and mediastinal contours are stable. There is mild brachiocephalic tortuosity. There are lower lung volumes with mildly increased atelectasis at the left lung base. No edema, confluent airspace opacity or  pleural effusion is seen. There are advanced glenohumeral degenerative changes, asymmetric to the right. IMPRESSION: Mildly increased left basilar atelectasis attributed to suboptimal inspiration. No evidence of pneumonia. Electronically Signed   By: Richardean Sale M.D.   On: 01/20/2016 10:59    Assessment/Plan Principal Problem:   Influenza A Active Problems:   Dementia without behavioral disturbance   Essential hypertension   Thrombocytopenia (HCC)   Multiple myeloma (HCC)   CKD (chronic kidney disease) stage 3, GFR 30-59 ml/min   Fever   Dehydration    Influenza A -This is the reason behind her fever. -Start Tamiflu and treat otherwise symptomatically, discharged back to SNF 24 hours after last fever.  Multiple myeloma -Is treated at the cancer center by Dr. Whitney Muse on a regimen of Revlimid and Coumadin 1 mg daily. -Has chronic thrombocytopenia that appears to be at baseline, will avoid heparin products.  Dementia -Pretty severe and at baseline.  Stage III chronic kidney disease - at baseline  Dehydration  -give IV fluids.  DVT prophylaxis -SCDs given thrombocytopenia  CODE STATUS -Presumed full code as patient and grandson and able to provide any further details  Time Spent on Admission: 75 minutes  HERNANDEZ ACOSTA,Charolette Bultman [facility] Pager: [phone] 01/20/2016, 5:18 PM

## 2016-01-20 NOTE — Progress Notes (Signed)
Pharmacy Antibiotic Note  [name] is a 82 y.o. female admitted on 01/20/2016 with influenza.  Pharmacy has been consulted for TAMIFLU dosing.  Plan: Tamiflu 30mg  PO BID x 5 days  Estimated Creatinine Clearance: 34.6 mL/min (by C-G formula based on Cr of 0.93).  Weight: 120 lb (54.432 kg)  Temp (24hrs), Avg:100.9 F (38.3 C), Min:100.4 F (38 C), Max:101.5 F (38.6 C)   Recent Labs Lab 01/20/16 1118  WBC 3.5*  CREATININE 0.93  LATICACIDVEN 1.3    Estimated Creatinine Clearance: 34.6 mL/min (by C-G formula based on Cr of 0.93).    Allergies  Allergen Reactions  . Pollen Extract     Teary eyes, runny nose.   Hart Robinsons A 01/20/2016 1:41 PM

## 2016-01-20 NOTE — ED Provider Notes (Signed)
CSN: [id number]     Arrival date & time 01/20/16  1007 History   First MD Initiated Contact with Patient 01/20/16 1114     Chief Complaint  Patient presents with  . Fever      Patient is a 82 y.o. female presenting with fever. The history is provided by the EMS personnel, the nursing home and a relative. The history is limited by the condition of the patient (Hx dementia).  Fever Pt was seen at 1120. Per EMS and NH report: NH called EMS for "fever." Pt's family at bedside states they were told the same. Unknown onset. Pt has significant hx of dementia and is acting per her baseline per family.   Past Medical History  Diagnosis Date  . Chronic leg pain   . Poor historian   . Psychosis   . Schizoaffective disorder   . Hypertension   . High cholesterol   . Dementia   . Thrombocytopenia ([place]) 11/10/2014  . Normocytic anemia 11/10/2014  . Elevated total protein 11/10/2014  . Multiple myeloma ([place]) 02/17/2015  . Arthritis   . Osteopenia 03/31/2015    On Bone scan on 03/25/2015   Past Surgical History  Procedure Laterality Date  . Tubal ligation    . Foot surgery    . Colonoscopy  2010    RMR: 1. Normal rectum 2. Sigmoid and cecal polyp, status post snare resection described above. Remainder of the colonic mucosa appeared normal.    Family History  Problem Relation Age of Onset  . Diabetes Mother    Social History  Substance Use Topics  . Smoking status: Never Smoker   . Smokeless tobacco: None  . Alcohol Use: No    Review of Systems  Unable to perform ROS: Dementia  Constitutional: Positive for fever.      Allergies  Pollen extract  Home Medications   Prior to Admission medications   Medication Sig Start Date End Date Taking? Authorizing Provider  acetaminophen (TYLENOL) 500 MG tablet Take 500 mg by mouth 3 (three) times daily as needed for mild pain.     Historical Provider, MD  amLODipine (NORVASC) 5 MG tablet Take 1 tablet (5 mg total) by mouth daily. 07/24/14    Barton Dubois, MD  Calcium Carb-Cholecalciferol (OYSTER SHELL CALCIUM + D) 500-200 MG-UNIT TABS Take 1 capsule by mouth 2 (two) times daily.    Historical Provider, MD  cephALEXin (KEFLEX) 250 MG capsule Take by mouth 2 (two) times daily.    Historical Provider, MD  clonazePAM (KLONOPIN) 0.25 MG disintegrating tablet Take 0.25 mg by mouth at bedtime.    Historical Provider, MD  dexamethasone (DECADRON) 4 MG tablet Take 10 tablets (40 mg total) by mouth once a week. 04/15/15   Radene Gunning, NP  fentaNYL (DURAGESIC - DOSED MCG/HR) 12 MCG/HR Place 1 patch (12.5 mcg total) onto the skin every 3 (three) days. 04/15/15   Radene Gunning, NP  ferrous sulfate 325 (65 FE) MG tablet Take 1 tablet (325 mg total) by mouth 3 (three) times daily with meals. 07/24/14   Barton Dubois, MD  fluticasone [facility]) 50 MCG/ACT nasal spray Place 2 sprays into both nostrils daily.    Historical Provider, MD  HYDROcodone-acetaminophen (NORCO/VICODIN) 5-325 MG per tablet Take 1 tablet by mouth at bedtime as needed (pain). 04/15/15   Radene Gunning, NP  lenalidomide (REVLIMID) 10 MG capsule Take 1 capsule (10 mg total) by mouth daily. 12/21/15   Baird Cancer, PA-C  loratadine (CLARITIN)  10 MG tablet Take 10 mg by mouth daily.    Historical Provider, MD  magnesium hydroxide (MILK OF MAGNESIA) 400 MG/5ML suspension Take 30 mLs by mouth daily as needed (bowel mobility). Reported on 11/10/2015    Historical Provider, MD  Melatonin 3 MG CAPS Take 1 capsule by mouth daily.    Historical Provider, MD  Multiple Vitamins-Minerals (MULTIVITAMIN WITH MINERALS) tablet Take 1 tablet by mouth daily.    Historical Provider, MD  ondansetron (ZOFRAN) 8 MG tablet Take 1 tablet (8 mg total) by mouth every 8 (eight) hours as needed for nausea or vomiting. 04/15/15   Radene Gunning, NP  polyethylene glycol (MIRALAX / GLYCOLAX) packet Take 17 g by mouth daily.    Historical Provider, MD  potassium chloride SA (K-DUR,KLOR-CON) 20 MEQ tablet Take 1 tablet (20  mEq total) by mouth 2 (two) times daily. 11/10/15   Baird Cancer, PA-C  senna (SENOKOT) 8.6 MG TABS tablet Take 1 tablet by mouth 2 (two) times daily.    Historical Provider, MD  warfarin (COUMADIN) 1 MG tablet Take 1 tablet (1 mg total) by mouth daily. 02/17/15   Baird Cancer, PA-C  Wheat Dextrin (BENEFIBER DRINK MIX) PACK Take by mouth. Take 2 teaspoons by mouth everyday.    Historical Provider, MD   BP 143/96 mmHg  Pulse 96  Temp(Src) 101.5 F (38.6 C) (Oral)  Resp 21  Wt 120 lb (54.432 kg)  SpO2 99%   11:52 Orthostatic Vital Signs VP  Orthostatic Lying  - BP- Lying: 137/63 mmHg ; Pulse- Lying: 87  Orthostatic Sitting - BP- Sitting: 127/70 mmHg ; Pulse- Sitting: 88     Patient Vitals for the past 24 hrs:  BP Temp Temp src Pulse Resp SpO2 Weight  01/20/16 1247 - 101.5 F (38.6 C) Oral - - - -  01/20/16 1245 143/96 mmHg - - - 21 - -  01/20/16 1230 139/71 mmHg - - - 13 - -  01/20/16 1215 135/64 mmHg - - - (!) 29 - -  01/20/16 1200 141/70 mmHg - - - 15 - -  01/20/16 1145 131/66 mmHg - - - 25 - -  01/20/16 1130 152/70 mmHg - - 96 14 99 % -  01/20/16 1036 - 100.7 F (38.2 C) Rectal - - - -  01/20/16 1030 127/90 mmHg - - - - - -  01/20/16 1004 143/68 mmHg 100.4 F (38 C) Oral 95 16 100 % 120 lb (54.432 kg)     Physical Exam 1125: Physical examination:  Nursing notes reviewed; Vital signs and O2 SAT reviewed; +febrile. ;; Constitutional: Well developed, Well nourished, In no acute distress; Head:  Normocephalic, atraumatic; Eyes: EOMI, PERRL, No scleral icterus; ENMT: Mouth and pharynx normal, Mucous membranes cracked and dry; Neck: Supple, Full range of motion, No lymphadenopathy; Cardiovascular: Regular rate and rhythm, No gallop; Respiratory: Breath sounds coarse & equal bilaterally, No wheezes. Normal respiratory effort/excursion; Chest: Nontender, Movement normal; Abdomen: Soft, Nontender, Nondistended, Normal bowel sounds; Genitourinary: No CVA tenderness; Extremities:  Pulses normal, No tenderness, No edema, No calf edema or asymmetry.; Neuro: Awake, alert, confused per hx dementia. No facial droop. Speech minimal but clear. Moves all extremities spontaneously..; Skin: Color normal, Warm, Dry.   ED Course  Procedures (including critical care time) Labs Review   Imaging Review  I have personally reviewed and evaluated these images and lab results as part of my medical decision-making.   EKG Interpretation   Date/Time:  Thursday January 20 2016 10:41:07  EST Ventricular Rate:  87 PR Interval:  124 QRS Duration: 80 QT Interval:  357 QTC Calculation: 429 R Axis:   39 Text Interpretation:  Sinus rhythm Consider left ventricular hypertrophy  Nonspecific T abnormalities, lateral leads When compared with ECG of  11/09/2014 Nonspecific T wave abnormality is now Present Confirmed by  [facility]  MD, Nunzio Cory [phone]) on 01/20/2016 11:37:06 AM      MDM  MDM Reviewed: previous chart, nursing note and vitals Reviewed previous: labs and ECG Interpretation: labs, ECG and x-ray     Results for orders placed or performed during the hospital encounter of 01/20/16  Urinalysis, Routine w reflex microscopic (not at [facility])  Result Value Ref Range   Color, Urine YELLOW YELLOW   APPearance CLEAR CLEAR   Specific Gravity, Urine 1.010 1.005 - 1.030   pH 7.0 5.0 - 8.0   Glucose, UA NEGATIVE NEGATIVE mg/dL   Hgb urine dipstick TRACE (A) NEGATIVE   Bilirubin Urine NEGATIVE NEGATIVE   Ketones, ur NEGATIVE NEGATIVE mg/dL   Protein, ur TRACE (A) NEGATIVE mg/dL   Nitrite NEGATIVE NEGATIVE   Leukocytes, UA NEGATIVE NEGATIVE  Urine microscopic-add on  Result Value Ref Range   Squamous Epithelial / LPF NONE SEEN NONE SEEN   WBC, UA NONE SEEN 0 - 5 WBC/hpf   RBC / HPF 0-5 0 - 5 RBC/hpf   Bacteria, UA FEW (A) NONE SEEN  Lactic acid, plasma  Result Value Ref Range   Lactic Acid, Venous 1.3 0.5 - 2.0 mmol/L  Basic metabolic panel  Result Value Ref Range   Sodium 138  135 - 145 mmol/L   Potassium 3.6 3.5 - 5.1 mmol/L   Chloride 105 101 - 111 mmol/L   CO2 27 22 - 32 mmol/L   Glucose, Bld 111 (H) 65 - 99 mg/dL   BUN 18 6 - 20 mg/dL   Creatinine, Ser 0.93 0.44 - 1.00 mg/dL   Calcium 8.6 (L) 8.9 - 10.3 mg/dL   GFR calc non Af Amer 55 (L) >60 mL/min   GFR calc Af Amer >60 >60 mL/min   Anion gap 6 5 - 15  CBC with Differential  Result Value Ref Range   WBC 3.5 (L) 4.0 - 10.5 K/uL   RBC 3.73 (L) 3.87 - 5.11 MIL/uL   Hemoglobin 12.8 12.0 - 15.0 g/dL   HCT 36.7 36.0 - 46.0 %   MCV 98.4 78.0 - 100.0 fL   MCH 34.3 (H) 26.0 - 34.0 pg   MCHC 34.9 30.0 - 36.0 g/dL   RDW 14.6 11.5 - 15.5 %   Platelets 109 (L) 150 - 400 K/uL   Neutrophils Relative % 58 %   Neutro Abs 2.1 1.7 - 7.7 K/uL   Lymphocytes Relative 13 %   Lymphs Abs 0.5 (L) 0.7 - 4.0 K/uL   Monocytes Relative 23 %   Monocytes Absolute 0.8 0.1 - 1.0 K/uL   Eosinophils Relative 3 %   Eosinophils Absolute 0.1 0.0 - 0.7 K/uL   Basophils Relative 3 %   Basophils Absolute 0.1 0.0 - 0.1 K/uL  Troponin I  Result Value Ref Range   Troponin I <0.03 <0.031 ng/mL   Dg Chest Portable 1 View 01/20/2016  CLINICAL DATA:  Fever.  History of hypertension and dementia. EXAM: PORTABLE CHEST 1 VIEW COMPARISON:  04/15/2015 and 04/14/2015. FINDINGS: 1041 hours. The heart size and mediastinal contours are stable. There is mild brachiocephalic tortuosity. There are lower lung volumes with mildly increased atelectasis at the left  lung base. No edema, confluent airspace opacity or pleural effusion is seen. There are advanced glenohumeral degenerative changes, asymmetric to the right. IMPRESSION: Mildly increased left basilar atelectasis attributed to suboptimal inspiration. No evidence of pneumonia. Electronically Signed   By: Richardean Sale M.D.   On: 01/20/2016 10:59    1310:  Thrombocytopenia per baseline. APAP given for fever, BC and UC pending. Pt unable to stand for orthostatic VS due to generalized weakness. Appears  clinically dehydrated; judicious IVF started. Influenza panel pending. T/C to [facility] Dr. Jerilee Hoh, case discussed, including:  HPI, pertinent PM/SHx, VS/PE, dx testing, ED course and treatment:  Agreeable to admit, requests to write temporary orders, obtain observation medical bed to team APAdmits.   Francine Graven, DO 01/24/16 1610

## 2016-01-20 NOTE — ED Notes (Signed)
Patient unable to stand.  Very drowsy.

## 2016-01-20 NOTE — ED Notes (Signed)
Pt comes in from [facility]. EMS was called out for fever. Pt has hx of dementia

## 2016-01-21 ENCOUNTER — Other Ambulatory Visit (HOSPITAL_COMMUNITY): Payer: Self-pay | Admitting: Oncology

## 2016-01-21 ENCOUNTER — Telehealth (HOSPITAL_COMMUNITY): Payer: Self-pay | Admitting: *Deleted

## 2016-01-21 DIAGNOSIS — F0390 Unspecified dementia without behavioral disturbance: Secondary | ICD-10-CM | POA: Diagnosis not present

## 2016-01-21 DIAGNOSIS — N183 Chronic kidney disease, stage 3 (moderate): Secondary | ICD-10-CM | POA: Diagnosis not present

## 2016-01-21 DIAGNOSIS — I10 Essential (primary) hypertension: Secondary | ICD-10-CM | POA: Diagnosis not present

## 2016-01-21 DIAGNOSIS — J101 Influenza due to other identified influenza virus with other respiratory manifestations: Secondary | ICD-10-CM | POA: Diagnosis not present

## 2016-01-21 DIAGNOSIS — R509 Fever, unspecified: Secondary | ICD-10-CM | POA: Diagnosis not present

## 2016-01-21 LAB — BASIC METABOLIC PANEL
ANION GAP: 6 (ref 5–15)
BUN: 14 mg/dL (ref 6–20)
CO2: 25 mmol/L (ref 22–32)
Calcium: 7.9 mg/dL — ABNORMAL LOW (ref 8.9–10.3)
Chloride: 108 mmol/L (ref 101–111)
Creatinine, Ser: 0.72 mg/dL (ref 0.44–1.00)
GFR calc Af Amer: >60 mL/min (ref >60)
GLUCOSE: 98 mg/dL (ref 65–99)
Potassium: 3.9 mmol/L (ref 3.5–5.1)
Sodium: 139 mmol/L (ref 135–145)

## 2016-01-21 LAB — CBC
HCT: 32.1 % — ABNORMAL LOW (ref 36.0–46.0)
Hemoglobin: 11.2 g/dL — ABNORMAL LOW (ref 12.0–15.0)
MCH: 34 pg (ref 26.0–34.0)
MCHC: 34.9 g/dL (ref 30.0–36.0)
MCV: 97.6 fL (ref 78.0–100.0)
PLATELETS: 108 10*3/uL — AB (ref 150–400)
RBC: 3.29 MIL/uL — ABNORMAL LOW (ref 3.87–5.11)
RDW: 14.4 % (ref 11.5–15.5)
WBC: 1.6 10*3/uL — AB (ref 4.0–10.5)

## 2016-01-21 LAB — PROTIME-INR
INR: 1.29 (ref 0.00–1.49)
PROTHROMBIN TIME: 16.2 s — AB (ref 11.6–15.2)

## 2016-01-21 MED ORDER — CHLORHEXIDINE GLUCONATE CLOTH 2 % EX PADS
6.0000 | MEDICATED_PAD | Freq: Every day | CUTANEOUS | Status: DC
  Administered 2016-01-21: 6 via TOPICAL

## 2016-01-21 MED ORDER — OSELTAMIVIR PHOSPHATE 30 MG PO CAPS: 30.0000 mg | ORAL_CAPSULE | Freq: Two times a day (BID) | ORAL | Status: DC

## 2016-01-21 NOTE — Discharge Summary (Signed)
Physician Discharge Summary  [name] AQT:[id number] DOB: 24-Aug-1930 DOA: 01/20/2016  PCP: [name], DO  Admit date: 01/20/2016 Discharge date: 01/21/2016  Time spent: 45 minutes  Recommendations for Outpatient Follow-up:  -We be discharged back to SNF today. -Has 3 days of Tamiflu remaining.   Discharge Diagnoses:  Principal Problem:   Influenza A Active Problems:   Dementia without behavioral disturbance   Essential hypertension   Thrombocytopenia (HCC)   Multiple myeloma (HCC)   CKD (chronic kidney disease) stage 3, GFR 30-59 ml/min   Fever   Dehydration   Discharge Condition: Stable and improved  Filed Weights   01/20/16 1004 01/20/16 1532  Weight: 54.432 kg (120 lb) 52.708 kg (116 lb 3.2 oz)    History of present illness:  Patient is an 82 year old woman with severe dementia who resides at a skilled nursing facility who was sent to the hospital today with complaints of fever. Patient is unable to provide any history given her dementia. Grandson at bedside can only tell me that she was sent from the skilled nursing for fever and nothing else. Workup in the ED shows normal vital signs however a temperature of 101.5, essentially unremarkable labs with the exception of leukopenia and thrombocytopenia which is chronic, chest x-ray shows no evidence of pneumonia or edema. We have been asked to admit her for further evaluation and management.   Hospital Course:   Influenza A -3 days of Tamiflu remaining on discharge, symptomatic management with Tylenol and cough suppressants as indicated.  Multiple myeloma next line-treated at the cancer center by Dr. Whitney Muse on a regimen of Revlimid and Coumadin. -Chronic thrombocytopenia that is at baseline.  Dementia -Pretty severe and at baseline.  Stage III chronic kidney disease at baseline.  Procedures:  None   Consultations:  None  Discharge Instructions  Discharge Instructions    Diet - low sodium heart  healthy    Complete by:  As directed      Increase activity slowly    Complete by:  As directed             Medication List    STOP taking these medications        ondansetron 8 MG tablet  Commonly known as:  ZOFRAN      TAKE these medications        acetaminophen 325 MG tablet  Commonly known as:  TYLENOL  Take 650 mg by mouth every 4 (four) hours as needed for fever.     amLODipine 2.5 MG tablet  Commonly known as:  NORVASC  Take 2.5 mg by mouth daily.     BENEFIBER DRINK MIX Pack  Take by mouth. Take 2 teaspoons by mouth everyday.     calcium carbonate 500 MG chewable tablet  Commonly known as:  TUMS - dosed in mg elemental calcium  Chew 2 tablets by mouth daily.     calcium citrate-vitamin D 315-200 MG-UNIT tablet  Commonly known as:  CITRACAL+D  Take 1 tablet by mouth 2 (two) times daily.     cholecalciferol 1000 units tablet  Commonly known as:  VITAMIN D  Take 500 Units by mouth daily.     clonazePAM 0.25 MG disintegrating tablet  Commonly known as:  KLONOPIN  Take 0.25 mg by mouth at bedtime.     dexamethasone 4 MG tablet  Commonly known as:  DECADRON  Take 10 tablets (40 mg total) by mouth once a week.     fentaNYL 12 MCG/HR  Commonly known as:  [place] - dosed mcg/hr  Place 1 patch (12.5 mcg total) onto the skin every 3 (three) days.     ferrous sulfate 325 (65 FE) MG tablet  Take 1 tablet (325 mg total) by mouth 3 (three) times daily with meals.     fluticasone 50 MCG/ACT nasal spray  Commonly known as:  FLONASE  Place 2 sprays into both nostrils daily.     HYDROcodone-acetaminophen 5-325 MG tablet  Commonly known as:  NORCO/VICODIN  Take 1 tablet by mouth at bedtime as needed (pain).     lenalidomide 10 MG capsule  Commonly known as:  REVLIMID  Take 1 capsule (10 mg total) by mouth daily.     loperamide 2 MG capsule  Commonly known as:  IMODIUM  Take 2 mg by mouth as needed for diarrhea or loose stools.     loratadine 10 MG tablet    Commonly known as:  CLARITIN  Take 10 mg by mouth daily.     Melatonin 3 MG Caps  Take 3 mg by mouth at bedtime.     multivitamin with minerals tablet  Take 1 tablet by mouth daily.     oseltamivir 30 MG capsule  Commonly known as:  TAMIFLU  Take 1 capsule (30 mg total) by mouth 2 (two) times daily.     polyethylene glycol packet  Commonly known as:  MIRALAX / GLYCOLAX  Take 17 g by mouth at bedtime.     potassium chloride SA 20 MEQ tablet  Commonly known as:  K-DUR,KLOR-CON  Take 1 tablet (20 mEq total) by mouth 2 (two) times daily.     senna 8.6 MG Tabs tablet  Commonly known as:  SENOKOT  Take 1 tablet by mouth 2 (two) times daily.     warfarin 1 MG tablet  Commonly known as:  COUMADIN  Take 1 tablet (1 mg total) by mouth daily.       Allergies  Allergen Reactions  . Pollen Extract     Teary eyes, runny nose.      The results of significant diagnostics from this hospitalization (including imaging, microbiology, ancillary and laboratory) are listed below for reference.    Significant Diagnostic Studies: Dg Chest Portable 1 View  01/20/2016  CLINICAL DATA:  Fever.  History of hypertension and dementia. EXAM: PORTABLE CHEST 1 VIEW COMPARISON:  04/15/2015 and 04/14/2015. FINDINGS: 1041 hours. The heart size and mediastinal contours are stable. There is mild brachiocephalic tortuosity. There are lower lung volumes with mildly increased atelectasis at the left lung base. No edema, confluent airspace opacity or pleural effusion is seen. There are advanced glenohumeral degenerative changes, asymmetric to the right. IMPRESSION: Mildly increased left basilar atelectasis attributed to suboptimal inspiration. No evidence of pneumonia. Electronically Signed   By: Richardean Sale M.D.   On: 01/20/2016 10:59    Microbiology: Recent Results (from the past 240 hour(s))  Urine culture     Status: None (Preliminary result)   Collection Time: 01/20/16 10:17 AM  Result Value Ref  Range Status   Specimen Description URINE, CATHETERIZED  Final   Special Requests NONE  Final   Culture   Final    NO GROWTH < 12 HOURS Performed at [facility]    Report Status PENDING  Incomplete  MRSA PCR Screening     Status: Abnormal   Collection Time: 01/20/16  3:45 PM  Result Value Ref Range Status   MRSA by PCR POSITIVE (A) NEGATIVE Final  Comment: RESULT CALLED TO, READ BACK BY AND VERIFIED WITH: CULLEN,M AT 2053 ON 01/20/2016 BY ISLEY,B        The GeneXpert MRSA Assay (FDA approved for NASAL specimens only), is one component of a comprehensive MRSA colonization surveillance program. It is not intended to diagnose MRSA infection nor to guide or monitor treatment for MRSA infections.      Labs: Basic Metabolic Panel:  Recent Labs Lab 01/20/16 1118 01/21/16 0701  NA 138 139  K 3.6 3.9  CL 105 108  CO2 27 25  GLUCOSE 111* 98  BUN 18 14  CREATININE 0.93 0.72  CALCIUM 8.6* 7.9*   Liver Function Tests: No results for input(s): AST, ALT, ALKPHOS, BILITOT, PROT, ALBUMIN in the last 168 hours. No results for input(s): LIPASE, AMYLASE in the last 168 hours. No results for input(s): AMMONIA in the last 168 hours. CBC:  Recent Labs Lab 01/20/16 1118 01/21/16 0701  WBC 3.5* 1.6*  NEUTROABS 2.1  --   HGB 12.8 11.2*  HCT 36.7 32.1*  MCV 98.4 97.6  PLT 109* 108*   Cardiac Enzymes:  Recent Labs Lab 01/20/16 1118  TROPONINI <0.03   BNP: BNP (last 3 results) No results for input(s): BNP in the last 8760 hours.  ProBNP (last 3 results) No results for input(s): PROBNP in the last 8760 hours.  CBG: No results for input(s): GLUCAP in the last 168 hours.     SignedLelon Frohlich  [facility] Pager: [phone] 01/21/2016, 12:48 PM

## 2016-01-21 NOTE — Progress Notes (Signed)
Pt's IV catheter removed and intact. Pt's IV site clean dry and intact. Report called and given to [facility] ( [place]). All questions were answered and no further questions at this time. Pt in stable condition at time of discharge and in no acute distress. Pt transported by [facility].

## 2016-01-21 NOTE — Clinical Social Work Note (Signed)
CSW was notified that patient is discharging back to [facility]. CSW met with patient and daughter Altha Harm at bedside. Patient and family want patient to return to the facility. Per MD patient ready to DC back to [facility]. RN, patient/family, and facility notified of patient's DC. RN given number for report. DC packet on patient's chart. Ambulance transport requested for patient. CSW signing off at this time.

## 2016-01-21 NOTE — Care Management Obs Status (Signed)
[facility] NOTIFICATION   Patient Details  Name: [name] MRN: [id number] Date of Birth: Jan 10, 1930   Medicare Observation Status Notification Given:  Yes    Alvie Heidelberg, RN 01/21/2016, 8:22 AM

## 2016-01-22 LAB — URINE CULTURE: CULTURE: NO GROWTH

## 2016-01-25 ENCOUNTER — Other Ambulatory Visit (HOSPITAL_COMMUNITY): Payer: Self-pay | Admitting: Oncology

## 2016-01-25 DIAGNOSIS — C9000 Multiple myeloma not having achieved remission: Secondary | ICD-10-CM

## 2016-01-25 LAB — CULTURE, BLOOD (ROUTINE X 2)
Culture: NO GROWTH
Culture: NO GROWTH

## 2016-01-25 MED ORDER — LENALIDOMIDE 10 MG PO CAPS: 10.0000 mg | ORAL_CAPSULE | Freq: Every day | ORAL | Status: DC

## 2016-01-27 ENCOUNTER — Telehealth (HOSPITAL_COMMUNITY): Payer: Self-pay | Admitting: *Deleted

## 2016-01-27 NOTE — Telephone Encounter (Signed)
Printed on 3/14  Doy Mince 01/27/2016 2:40 PM

## 2016-01-31 ENCOUNTER — Other Ambulatory Visit (HOSPITAL_COMMUNITY): Payer: Self-pay | Admitting: Oncology

## 2016-01-31 DIAGNOSIS — C9000 Multiple myeloma not having achieved remission: Secondary | ICD-10-CM

## 2016-01-31 MED ORDER — LENALIDOMIDE 10 MG PO CAPS: 10.0000 mg | ORAL_CAPSULE | Freq: Every day | ORAL | Status: DC

## 2016-02-02 ENCOUNTER — Other Ambulatory Visit (HOSPITAL_COMMUNITY): Payer: Self-pay

## 2016-02-02 ENCOUNTER — Ambulatory Visit (HOSPITAL_COMMUNITY): Payer: Self-pay

## 2016-02-22 ENCOUNTER — Other Ambulatory Visit (HOSPITAL_COMMUNITY): Payer: Self-pay | Admitting: Oncology

## 2016-02-22 DIAGNOSIS — C9000 Multiple myeloma not having achieved remission: Secondary | ICD-10-CM

## 2016-02-22 MED ORDER — LENALIDOMIDE 10 MG PO CAPS: 10.0000 mg | ORAL_CAPSULE | Freq: Every day | ORAL | Status: DC

## 2016-02-29 NOTE — Assessment & Plan Note (Addendum)
IgG multiple myeloma presenting with severe anemia.   BMBX on 02/08/2015 with plasma cell neoplasm at 58% plasma cells.  Currently on Revlimid/Dex and 1 mg of Coumadin daily.  Additionally, she is on Zometa every 2 months.  Supportive therapy plan reviewed.  She is scheduled for Zometa today but her labs show a minimal hypocalcemia. We will therefore hold Zometa today and try again next time. She is encouraged compliance with calcium and vitamin D. She is on calcium 3 times daily dosing.  Labs today: CBC diff, CMET, LDH, ESR, CRP, SPEP+IFE, B2M.  Labs in 8 weeks: CBC diff, CMET, LDH, ESR, CRP, SPEP+IFE, B2M  Continued response to therapy thus far with declining M-spike, down to 1.2 most recently compared to 8.68 with improvement in QOL and labs.  RD therapy was chosen due to its tolerance.  She is on 1 mg of Coumadin daily (not in need of INR monitoring) due to increased risk of VTE in the setting of Revlimid therapy.  In the hospital, she had a positive MRSA nasal swab. Patient's probably going to be chronically colonized given her placement in a nursing facility. If one tablet, she could apply Neosporin or similar product to be a Q-tip to each nare bilaterally daily. I think this will be futile.  She does have bilateral varus formation of both knees. I suspect this may be participating in her knee pain. I suspect arthritic changes as a result. The granddaughter asks if they can apply Aspercreme to the affected joint and of course, that will be just fine. Other options include BenGay, IcyHot, etc.  Return in 8 weeks for follow-up, labs, and Zometa (labs willing).

## 2016-02-29 NOTE — Progress Notes (Signed)
[place] [address] [place] [address]  Multiple myeloma in relapse [facility]) - Plan: CBC with Differential, Comprehensive metabolic panel, Lactate dehydrogenase, Sedimentation rate, Kappa/lambda light chains, Beta 2 microglobuline, serum, IgG, IgA, IgM, Immunofixation electrophoresis, Protein electrophoresis, serum, C-reactive protein  CURRENT THERAPY: RD and 1 mg of Coumadin daily and Zometa every 8 weeks  INTERVAL HISTORY: [name] 82 y.o. female returns for followup of IgG multiple myeloma presenting with severe anemia.   BMBX on 02/08/2015 with plasma cell neoplasm at 58% plasma cells.    Multiple myeloma ([place])   02/08/2015 Bone Marrow Biopsy The bone marrow exhibits a marked increase in plasma cells (58% by aspirate), consistent with plasma cell myeloma.   03/05/2015 -  Chemotherapy Revlimid 10 mg days 1-21 every 28 days and 40 mg of Dexamethasone weekly.   03/25/2015 Imaging Bone survey- Generalized osteopenia with slightly heterogeneous lucency in the shafts of the humeri and femora. No discrete, sizable lytic lesions identified.    I personally reviewed and went over laboratory results with the patient.  The results are noted within this dictation.  Labs will be updated today.  Patient was admitted to the hospital from 3/9- 01/21/2016 for influenza A.  She is recovering nicely from this. Of note, she lost weight in the hospital and her weight is now up approximately 2 pounds since discharge in March 2017. Overall, her weights down approximately 2 pounds compared to her weight and 2 months ago when she was approximately 120 pounds.  Family members today continue to report that the patient needs a very well with them. They're not sure how well she eats when she is at the nursing facility as the patient reports "they do not know how to cook."  The daughter is concerned about a positive MRSA test while in the hospital. I reviewed the patient's chart,  and she was diagnosed with MRSA via nasal swab. There educated regarding this. It would be not unreasonable to apply Neosporin or something similar to each nare bilaterally with a Q-tip, however, she is likely chronically colonized due to her placement in a nursing facility. I suspect these interventions will be futile and the family is agreeable.  Patient's granddaughter reports that [name] complained of right knee pain. Today during examination, I cannot ascertain any obvious abnormalities. See physical exam below.   Past Medical History  Diagnosis Date  . Chronic leg pain   . Poor historian   . Psychosis   . Schizoaffective disorder   . Hypertension   . High cholesterol   . Dementia   . Thrombocytopenia ([place]) 11/10/2014  . Normocytic anemia 11/10/2014  . Elevated total protein 11/10/2014  . Multiple myeloma ([place]) 02/17/2015  . Arthritis   . Osteopenia 03/31/2015    On Bone scan on 03/25/2015    has HEMORRHOIDS, INTERNAL; GERD; OTHER DYSPHAGIA; CERVICAL MUSCLE STRAIN; COLONIC POLYPS, ADENOMATOUS, HX OF; Dementia without behavioral disturbance; Altered mental status; Essential hypertension; Routine gynecological examination; Cervicitis; Vaginal bleeding; Schizoaffective disorder, unspecified type ([place]); Vagina bleeding; Anemia; Normocytic anemia; Thrombocytopenia ([place]); Elevated total protein; Constipation; Multiple myeloma ([place]); Osteopenia; Dementia; CKD (chronic kidney disease) stage 3, GFR 30-59 ml/min; Hyperglycemia; Generalized weakness; Fever; Influenza A; and Dehydration on her problem list.     is allergic to pollen extract.  Current Outpatient Prescriptions on File Prior to Visit  Medication Sig Dispense Refill  . acetaminophen (TYLENOL) 325 MG tablet Take 650 mg by mouth every 4 (four)  hours as needed for fever.    Marland Kitchen amLODipine (NORVASC) 2.5 MG tablet Take 2.5 mg by mouth daily.    . calcium carbonate (TUMS - DOSED IN MG ELEMENTAL CALCIUM) 500 MG chewable tablet Chew 2 tablets  by mouth daily.    . calcium citrate-vitamin D (CITRACAL+D) 315-200 MG-UNIT tablet Take 1 tablet by mouth 2 (two) times daily.    . cholecalciferol (VITAMIN D) 1000 units tablet Take 500 Units by mouth daily.    . clonazePAM (KLONOPIN) 0.25 MG disintegrating tablet Take 0.25 mg by mouth at bedtime.    Marland Kitchen dexamethasone (DECADRON) 4 MG tablet Take 10 tablets (40 mg total) by mouth once a week. (Patient taking differently: Take 40 mg by mouth once a week. Receives every Thursday.)    . fentaNYL (DURAGESIC - DOSED MCG/HR) 12 MCG/HR Place 1 patch (12.5 mcg total) onto the skin every 3 (three) days. (Patient taking differently: Place 12 mcg onto the skin every 3 (three) days. )    . ferrous sulfate 325 (65 FE) MG tablet Take 1 tablet (325 mg total) by mouth 3 (three) times daily with meals. (Patient taking differently: Take 325 mg by mouth 4 (four) times daily. )    . fluticasone (FLONASE) 50 MCG/ACT nasal spray Place 2 sprays into both nostrils daily.    Marland Kitchen HYDROcodone-acetaminophen (NORCO/VICODIN) 5-325 MG per tablet Take 1 tablet by mouth at bedtime as needed (pain). (Patient taking differently: Take 1 tablet by mouth every 12 (twelve) hours as needed (pain). ) 20 tablet 0  . lenalidomide (REVLIMID) 10 MG capsule Take 1 capsule (10 mg total) by mouth daily. 21 capsule 0  . loperamide (IMODIUM) 2 MG capsule Take 2 mg by mouth as needed for diarrhea or loose stools.    Marland Kitchen loratadine (CLARITIN) 10 MG tablet Take 10 mg by mouth daily.    . Melatonin 3 MG CAPS Take 3 mg by mouth at bedtime.     . Multiple Vitamins-Minerals (MULTIVITAMIN WITH MINERALS) tablet Take 1 tablet by mouth daily.    Marland Kitchen oseltamivir (TAMIFLU) 30 MG capsule Take 1 capsule (30 mg total) by mouth 2 (two) times daily. 6 capsule 0  . polyethylene glycol (MIRALAX / GLYCOLAX) packet Take 17 g by mouth at bedtime.     . potassium chloride SA (K-DUR,KLOR-CON) 20 MEQ tablet Take 1 tablet (20 mEq total) by mouth 2 (two) times daily. 60 tablet 2  .  senna (SENOKOT) 8.6 MG TABS tablet Take 1 tablet by mouth 2 (two) times daily.    Marland Kitchen warfarin (COUMADIN) 1 MG tablet Take 1 tablet (1 mg total) by mouth daily. 30 tablet 3  . Wheat Dextrin (BENEFIBER DRINK MIX) PACK Take by mouth. Take 2 teaspoons by mouth everyday.     No current facility-administered medications on file prior to visit.    Past Surgical History  Procedure Laterality Date  . Tubal ligation    . Foot surgery    . Colonoscopy  2010    RMR: 1. Normal rectum 2. Sigmoid and cecal polyp, status post snare resection described above. Remainder of the colonic mucosa appeared normal.     Denies any headaches, dizziness, double vision, fevers, chills, night sweats, nausea, vomiting, diarrhea, constipation, chest pain, heart palpitations, shortness of breath, blood in stool, black tarry stool, urinary pain, urinary burning, urinary frequency, hematuria.   PHYSICAL EXAMINATION  ECOG PERFORMANCE STATUS: 1 - Symptomatic but completely ambulatory  Filed Vitals:   03/01/16 1117  Pulse: 76  Temp: 98.8  F (37.1 C)  Resp: 18    GENERAL:alert, no distress, well nourished, well developed, comfortable, cooperative, smiling, pleasantly confused/demented, and accompanied by daughters. SKIN: skin color, texture, turgor are normal, no rashes or significant lesions HEAD: Normocephalic, No masses, lesions, tenderness or abnormalities EYES: normal, PERRLA, EOMI, Conjunctiva are pink and non-injected. EARS: External ears normal OROPHARYNX:lips, buccal mucosa, and tongue normal and mucous membranes are moist  NECK: supple, no adenopathy, thyroid normal size, non-tender, without nodularity, no stridor, non-tender, trachea midline LYMPH:  no palpable lymphadenopathy BREAST:not examined LUNGS: clear to auscultation bilaterally without wheezes, rales, or rhonchi. HEART: regular rate & rhythm without murmur, rub, or gallop. Normal S1 and S2. ABDOMEN:abdomen soft, non-tender and normal bowel  sounds BACK: Back symmetric, no curvature., No CVA tenderness EXTREMITIES:less then 2 second capillary refill, effusion, or inflammation, no skin discoloration, no cyanosis, varus deformation bilaterally of knees NEURO: alert & oriented x 3 with fluent speech, no focal motor/sensory deficits, gait normal    LABORATORY DATA: CBC    Component Value Date/Time   WBC 4.3 03/01/2016 1010   RBC 3.46* 03/01/2016 1010   RBC 1.98* 04/14/2015 1816   HGB 12.1 03/01/2016 1010   HCT 34.3* 03/01/2016 1010   PLT 172 03/01/2016 1010   MCV 99.1 03/01/2016 1010   MCH 35.0* 03/01/2016 1010   MCHC 35.3 03/01/2016 1010   RDW 14.2 03/01/2016 1010   LYMPHSABS 1.2 03/01/2016 1010   MONOABS 0.7 03/01/2016 1010   EOSABS 0.1 03/01/2016 1010   BASOSABS 0.1 03/01/2016 1010      Chemistry      Component Value Date/Time   NA 138 03/01/2016 1010   K 3.6 03/01/2016 1010   CL 103 03/01/2016 1010   CO2 30 03/01/2016 1010   BUN 17 03/01/2016 1010   CREATININE 0.76 03/01/2016 1010      Component Value Date/Time   CALCIUM 8.8* 03/01/2016 1010   ALKPHOS 49 03/01/2016 1010   AST 19 03/01/2016 1010   ALT 17 03/01/2016 1010   BILITOT 0.4 03/01/2016 1010     Lab Results  Component Value Date   PROT 7.1 03/01/2016   ALBUMINELP 3.5 01/05/2016   A1GS 0.2 01/05/2016   A2GS 0.7 01/05/2016   BETS 0.8 01/05/2016   BETA2SER 1.0* 01/20/2015   GAMS 1.4 01/05/2016   MSPIKE 1.2* 01/05/2016   SPEI Comment 01/05/2016   SPECOM Comment 01/05/2016   IGGSERUM 1728* 01/05/2016   IGGSERUM 1729* 01/05/2016   IGA 105 01/05/2016   IGA 97 01/05/2016   IGMSERUM 27 01/05/2016   IGMSERUM 20* 01/05/2016   IMMELINT (NOTE) 01/20/2015   KPAFRELGTCHN 36.18* 01/05/2016   LAMBDASER 16.57 01/05/2016   KAPLAMBRATIO 2.18* 01/05/2016    PENDING LABS:   RADIOGRAPHIC STUDIES:  No results found.   PATHOLOGY:    ASSESSMENT AND PLAN:  Multiple myeloma (HCC) IgG multiple myeloma presenting with severe anemia.   BMBX on  02/08/2015 with plasma cell neoplasm at 58% plasma cells.  Currently on Revlimid/Dex and 1 mg of Coumadin daily.  Additionally, she is on Zometa every 2 months.  Supportive therapy plan reviewed.  She is scheduled for Zometa today but her labs show a minimal hypocalcemia. We will therefore hold Zometa today and try again next time. She is encouraged compliance with calcium and vitamin D. She is on calcium 3 times daily dosing.  Labs today: CBC diff, CMET, LDH, ESR, CRP, SPEP+IFE, B2M.  Labs in 8 weeks: CBC diff, CMET, LDH, ESR, CRP, SPEP+IFE, B2M  Continued response  to therapy thus far with declining M-spike, down to 1.2 most recently compared to 8.68 with improvement in QOL and labs.  RD therapy was chosen due to its tolerance.  She is on 1 mg of Coumadin daily (not in need of INR monitoring) due to increased risk of VTE in the setting of Revlimid therapy.  In the hospital, she had a positive MRSA nasal swab. Patient's probably going to be chronically colonized given her placement in a nursing facility. If one tablet, she could apply Neosporin or similar product to be a Q-tip to each nare bilaterally daily. I think this will be futile.  She does have bilateral varus formation of both knees. I suspect this may be participating in her knee pain. I suspect arthritic changes as a result. The granddaughter asks if they can apply Aspercreme to the affected joint and of course, that will be just fine. Other options include BenGay, IcyHot, etc.  Return in 8 weeks for follow-up, labs, and Zometa (labs willing).   THERAPY PLAN:  Continue with treatment as outlined.    All questions were answered. The patient knows to call the clinic with any problems, questions or concerns. We can certainly see the patient much sooner if necessary.  Patient and plan discussed with Dr. Ancil Linsey and she is in agreement with the aforementioned.   This note is electronically signed by: Doy Mince 03/01/2016 5:28 PM

## 2016-03-01 ENCOUNTER — Encounter (HOSPITAL_COMMUNITY): Payer: Medicare Other

## 2016-03-01 ENCOUNTER — Encounter (HOSPITAL_COMMUNITY): Payer: Medicare Other | Attending: Family Medicine | Admitting: Oncology

## 2016-03-01 VITALS — HR 76 | Temp 98.8°F | Resp 18 | Wt 118.0 lb

## 2016-03-01 DIAGNOSIS — D649 Anemia, unspecified: Secondary | ICD-10-CM | POA: Insufficient documentation

## 2016-03-01 DIAGNOSIS — C9000 Multiple myeloma not having achieved remission: Secondary | ICD-10-CM

## 2016-03-01 DIAGNOSIS — D630 Anemia in neoplastic disease: Secondary | ICD-10-CM | POA: Diagnosis not present

## 2016-03-01 DIAGNOSIS — C9002 Multiple myeloma in relapse: Secondary | ICD-10-CM

## 2016-03-01 LAB — CBC WITH DIFFERENTIAL/PLATELET
Basophils Absolute: 0.1 10*3/uL (ref 0.0–0.1)
Basophils Relative: 2 %
EOS ABS: 0.1 10*3/uL (ref 0.0–0.7)
Eosinophils Relative: 2 %
HCT: 34.3 % — ABNORMAL LOW (ref 36.0–46.0)
HEMOGLOBIN: 12.1 g/dL (ref 12.0–15.0)
LYMPHS ABS: 1.2 10*3/uL (ref 0.7–4.0)
LYMPHS PCT: 28 %
MCH: 35 pg — AB (ref 26.0–34.0)
MCHC: 35.3 g/dL (ref 30.0–36.0)
MCV: 99.1 fL (ref 78.0–100.0)
MONOS PCT: 16 %
Monocytes Absolute: 0.7 10*3/uL (ref 0.1–1.0)
NEUTROS PCT: 52 %
Neutro Abs: 2.3 10*3/uL (ref 1.7–7.7)
Platelets: 172 10*3/uL (ref 150–400)
RBC: 3.46 MIL/uL — ABNORMAL LOW (ref 3.87–5.11)
RDW: 14.2 % (ref 11.5–15.5)
WBC: 4.3 10*3/uL (ref 4.0–10.5)

## 2016-03-01 LAB — COMPREHENSIVE METABOLIC PANEL
ALK PHOS: 49 U/L (ref 38–126)
ALT: 17 U/L (ref 14–54)
ANION GAP: 5 (ref 5–15)
AST: 19 U/L (ref 15–41)
Albumin: 3.6 g/dL (ref 3.5–5.0)
BILIRUBIN TOTAL: 0.4 mg/dL (ref 0.3–1.2)
BUN: 17 mg/dL (ref 6–20)
CALCIUM: 8.8 mg/dL — AB (ref 8.9–10.3)
CO2: 30 mmol/L (ref 22–32)
CREATININE: 0.76 mg/dL (ref 0.44–1.00)
Chloride: 103 mmol/L (ref 101–111)
Glucose, Bld: 88 mg/dL (ref 65–99)
Potassium: 3.6 mmol/L (ref 3.5–5.1)
SODIUM: 138 mmol/L (ref 135–145)
TOTAL PROTEIN: 7.1 g/dL (ref 6.5–8.1)

## 2016-03-01 LAB — LACTATE DEHYDROGENASE: LDH: 96 U/L — AB (ref 98–192)

## 2016-03-01 LAB — C-REACTIVE PROTEIN: CRP: 0.6 mg/dL (ref <1.0)

## 2016-03-01 LAB — SEDIMENTATION RATE: SED RATE: 20 mm/h (ref 0–22)

## 2016-03-01 NOTE — Patient Instructions (Signed)
[place] at [facility] Discharge Instructions  RECOMMENDATIONS MADE BY THE CONSULTANT AND ANY TEST RESULTS WILL BE SENT TO YOUR REFERRING PHYSICIAN.  No zometa today Continue as prescribed Continue coumadin Return in 2 months     Thank you for choosing [place] at [facility] to provide your oncology and hematology care.  To afford each patient quality time with our provider, please arrive at least 15 minutes before your scheduled appointment time.   Beginning January 23rd 2017 lab work for the [facility] will be done in the  Main lab at [facility] on 1st floor. If you have a lab appointment with the [place] please come in thru the  Main Entrance and check in at the main information desk  You need to re-schedule your appointment should you arrive 10 or more minutes late.  We strive to give you quality time with our providers, and arriving late affects you and other patients whose appointments are after yours.  Also, if you no show three or more times for appointments you may be dismissed from the clinic at the providers discretion.     Again, thank you for choosing [facility].  Our hope is that these requests will decrease the amount of time that you wait before being seen by our physicians.       _____________________________________________________________  Should you have questions after your visit to [facility], please contact our office at [phone] between the hours of 8:30 a.m. and 4:30 p.m.  Voicemails left after 4:30 p.m. will not be returned until the following business day.  For prescription refill requests, have your pharmacy contact our office.         Resources For Cancer Patients and their Caregivers ? American Cancer Society: Can assist with transportation, wigs, general needs, runs Look Good Feel Better.        979-163-1430 ? Cancer Care: Provides financial assistance,  online support groups, medication/co-pay assistance.  1-800-813-HOPE (515) 125-5165) ? [place] [facility] [place] cancer patients and their families through emotional , educational and financial support.  (443)154-4666 ? [place] DSS Where to apply for food stamps, Medicaid and utility assistance. (940)008-8105 ? RCATS: Transportation to medical appointments. 701-773-2787 ? Social Security Administration: May apply for disability if have a Stage IV cancer. [phone] [phone] ? [employer], Disability and Transit Services: Assists with nutrition, care and transit needs. [phone]

## 2016-03-01 NOTE — Progress Notes (Signed)
Calcium 8.8. No zometa infusion today per T.Robertson,RN

## 2016-03-02 LAB — PROTEIN ELECTROPHORESIS, SERUM
A/G Ratio: 1 (ref 0.7–1.7)
Albumin ELP: 3.4 g/dL (ref 2.9–4.4)
Alpha-1-Globulin: 0.3 g/dL (ref 0.0–0.4)
Alpha-2-Globulin: 0.7 g/dL (ref 0.4–1.0)
Beta Globulin: 0.9 g/dL (ref 0.7–1.3)
GAMMA GLOBULIN: 1.5 g/dL (ref 0.4–1.8)
Globulin, Total: 3.4 g/dL (ref 2.2–3.9)
M-SPIKE, %: 1.3 g/dL — AB
TOTAL PROTEIN ELP: 6.8 g/dL (ref 6.0–8.5)

## 2016-03-02 LAB — KAPPA/LAMBDA LIGHT CHAINS
KAPPA FREE LGHT CHN: 72.21 mg/L — AB (ref 3.30–19.40)
Kappa, lambda light chain ratio: 2.17 — ABNORMAL HIGH (ref 0.26–1.65)
LAMDA FREE LIGHT CHAINS: 33.29 mg/L — AB (ref 5.71–26.30)

## 2016-03-02 LAB — BETA 2 MICROGLOBULIN, SERUM: Beta-2 Microglobulin: 2.6 mg/L — ABNORMAL HIGH (ref 0.6–2.4)

## 2016-03-02 LAB — IGG, IGA, IGM
IGG (IMMUNOGLOBIN G), SERUM: 1654 mg/dL — AB (ref 700–1600)
IgA: 136 mg/dL (ref 64–422)
IgM, Serum: 21 mg/dL — ABNORMAL LOW (ref 26–217)

## 2016-03-03 LAB — IMMUNOFIXATION ELECTROPHORESIS
IGA: 141 mg/dL (ref 64–422)
IGG (IMMUNOGLOBIN G), SERUM: 1649 mg/dL — AB (ref 700–1600)
IgM, Serum: 21 mg/dL — ABNORMAL LOW (ref 26–217)
Total Protein ELP: 6.8 g/dL (ref 6.0–8.5)

## 2016-03-21 ENCOUNTER — Other Ambulatory Visit (HOSPITAL_COMMUNITY): Payer: Self-pay | Admitting: Oncology

## 2016-03-21 DIAGNOSIS — C9000 Multiple myeloma not having achieved remission: Secondary | ICD-10-CM

## 2016-03-21 MED ORDER — LENALIDOMIDE 10 MG PO CAPS: 10.0000 mg | ORAL_CAPSULE | Freq: Every day | ORAL | Status: DC

## 2016-04-14 ENCOUNTER — Other Ambulatory Visit (HOSPITAL_COMMUNITY): Payer: Self-pay | Admitting: Oncology

## 2016-04-14 DIAGNOSIS — C9000 Multiple myeloma not having achieved remission: Secondary | ICD-10-CM

## 2016-04-14 MED ORDER — LENALIDOMIDE 10 MG PO CAPS: 10.0000 mg | ORAL_CAPSULE | Freq: Every day | ORAL | Status: DC

## 2016-05-09 ENCOUNTER — Encounter (HOSPITAL_COMMUNITY): Payer: Medicare Other | Attending: Family Medicine | Admitting: Hematology & Oncology

## 2016-05-09 ENCOUNTER — Encounter (HOSPITAL_COMMUNITY): Payer: Medicare Other

## 2016-05-09 ENCOUNTER — Ambulatory Visit (HOSPITAL_COMMUNITY): Payer: Self-pay

## 2016-05-09 ENCOUNTER — Encounter (HOSPITAL_BASED_OUTPATIENT_CLINIC_OR_DEPARTMENT_OTHER): Payer: Medicare Other

## 2016-05-09 ENCOUNTER — Encounter (HOSPITAL_COMMUNITY): Payer: Self-pay | Admitting: Hematology & Oncology

## 2016-05-09 VITALS — BP 127/59 | HR 75 | Temp 99.9°F | Resp 20 | Wt 116.0 lb

## 2016-05-09 VITALS — BP 137/54 | HR 65 | Temp 97.8°F | Resp 20

## 2016-05-09 DIAGNOSIS — D649 Anemia, unspecified: Secondary | ICD-10-CM | POA: Insufficient documentation

## 2016-05-09 DIAGNOSIS — M8580 Other specified disorders of bone density and structure, unspecified site: Secondary | ICD-10-CM

## 2016-05-09 DIAGNOSIS — C9002 Multiple myeloma in relapse: Secondary | ICD-10-CM

## 2016-05-09 DIAGNOSIS — C9000 Multiple myeloma not having achieved remission: Secondary | ICD-10-CM

## 2016-05-09 DIAGNOSIS — F0390 Unspecified dementia without behavioral disturbance: Secondary | ICD-10-CM

## 2016-05-09 LAB — COMPREHENSIVE METABOLIC PANEL
ALK PHOS: 48 U/L (ref 38–126)
ALT: 14 U/L (ref 14–54)
AST: 16 U/L (ref 15–41)
Albumin: 3.8 g/dL (ref 3.5–5.0)
Anion gap: 8 (ref 5–15)
BILIRUBIN TOTAL: 0.6 mg/dL (ref 0.3–1.2)
BUN: 19 mg/dL (ref 6–20)
CALCIUM: 9 mg/dL (ref 8.9–10.3)
CO2: 27 mmol/L (ref 22–32)
CREATININE: 0.83 mg/dL (ref 0.44–1.00)
Chloride: 106 mmol/L (ref 101–111)
GFR calc non Af Amer: >60 mL/min (ref >60)
GLUCOSE: 124 mg/dL — AB (ref 65–99)
Potassium: 3.5 mmol/L (ref 3.5–5.1)
SODIUM: 141 mmol/L (ref 135–145)
TOTAL PROTEIN: 6.7 g/dL (ref 6.5–8.1)

## 2016-05-09 LAB — CBC WITH DIFFERENTIAL/PLATELET
Basophils Absolute: 0.1 10*3/uL (ref 0.0–0.1)
Basophils Relative: 2 %
EOS ABS: 0.2 10*3/uL (ref 0.0–0.7)
EOS PCT: 5 %
HCT: 34.3 % — ABNORMAL LOW (ref 36.0–46.0)
Hemoglobin: 11.8 g/dL — ABNORMAL LOW (ref 12.0–15.0)
LYMPHS ABS: 0.9 10*3/uL (ref 0.7–4.0)
LYMPHS PCT: 24 %
MCH: 33.7 pg (ref 26.0–34.0)
MCHC: 34.4 g/dL (ref 30.0–36.0)
MCV: 98 fL (ref 78.0–100.0)
MONO ABS: 0.3 10*3/uL (ref 0.1–1.0)
Monocytes Relative: 7 %
Neutro Abs: 2.4 10*3/uL (ref 1.7–7.7)
Neutrophils Relative %: 62 %
PLATELETS: 156 10*3/uL (ref 150–400)
RBC: 3.5 MIL/uL — ABNORMAL LOW (ref 3.87–5.11)
RDW: 13.7 % (ref 11.5–15.5)
WBC: 3.9 10*3/uL — AB (ref 4.0–10.5)

## 2016-05-09 LAB — SEDIMENTATION RATE: SED RATE: 15 mm/h (ref 0–22)

## 2016-05-09 LAB — C-REACTIVE PROTEIN: CRP: 0.5 mg/dL (ref <1.0)

## 2016-05-09 LAB — URIC ACID: Uric Acid, Serum: 3.5 mg/dL (ref 2.3–6.6)

## 2016-05-09 LAB — LACTATE DEHYDROGENASE: LDH: 94 U/L — ABNORMAL LOW (ref 98–192)

## 2016-05-09 MED ORDER — LORATADINE 10 MG PO TABS: 10.0000 mg | ORAL_TABLET | Freq: Every day | ORAL | Status: DC

## 2016-05-09 MED ORDER — ZOLEDRONIC ACID 4 MG/5ML IV CONC
4.0000 mg | Freq: Once | INTRAVENOUS | Status: AC
  Administered 2016-05-09: 4 mg via INTRAVENOUS
  Filled 2016-05-09: qty 5

## 2016-05-09 MED ORDER — SODIUM CHLORIDE 0.9 % IV SOLN
Freq: Once | INTRAVENOUS | Status: AC
  Administered 2016-05-09: 11:00:00 via INTRAVENOUS

## 2016-05-09 NOTE — Progress Notes (Signed)
Zometa given today per orders Patient tolerated well.

## 2016-05-09 NOTE — Patient Instructions (Addendum)
[place] at [facility] Discharge Instructions  RECOMMENDATIONS MADE BY THE CONSULTANT AND ANY TEST RESULTS WILL BE SENT TO YOUR REFERRING PHYSICIAN.  Return to clinic in 2 months with labs  Continue Zometa  Thank you for choosing [place] at [facility] to provide your oncology and hematology care.  To afford each patient quality time with our provider, please arrive at least 15 minutes before your scheduled appointment time.   Beginning January 23rd 2017 lab work for the [facility] will be done in the  Main lab at [facility] on 1st floor. If you have a lab appointment with the [facility] please come in thru the  Main Entrance and check in at the main information desk  You need to re-schedule your appointment should you arrive 10 or more minutes late.  We strive to give you quality time with our providers, and arriving late affects you and other patients whose appointments are after yours.  Also, if you no show three or more times for appointments you may be dismissed from the clinic at the providers discretion.     Again, thank you for choosing [facility].  Our hope is that these requests will decrease the amount of time that you wait before being seen by our physicians.       _____________________________________________________________  Should you have questions after your visit to [facility], please contact our office at [phone] between the hours of 8:30 a.m. and 4:30 p.m.  Voicemails left after 4:30 p.m. will not be returned until the following business day.  For prescription refill requests, have your pharmacy contact our office.         Resources For Cancer Patients and their Caregivers ? American Cancer Society: Can assist with transportation, wigs, general needs, runs Look Good Feel Better.        ([phone] ? Cancer Care: Provides financial assistance, online support groups,  medication/co-pay assistance.  1-800-813-HOPE 613-563-4081) ? [place] [facility]ists [place] cancer patients and their families through emotional , educational and financial support.  (747) 041-2557 ? [place] DSS Where to apply for food stamps, Medicaid and utility assistance. 8568719003 ? RCATS: Transportation to medical appointments. 712 597 2062 ? Social Security Administration: May apply for disability if have a Stage IV cancer. 231-805-0205 928-068-1459 ? [employer], Disability and Transit Services: Assists with nutrition, care and transit needs. [place] Support Programs: @10RELATIVEDAYS @ > Cancer Support Group  2nd Tuesday of the month 1pm-2pm, Journey Room  > Creative Journey  3rd Tuesday of the month 1130am-1pm, Journey Room  > Look Good Feel Better  1st Wednesday of the month 10am-12 noon, Journey Room (Call [place] to register [phone])

## 2016-05-09 NOTE — Patient Instructions (Signed)
[place] at [facility] Discharge Instructions  RECOMMENDATIONS MADE BY THE CONSULTANT AND ANY TEST RESULTS WILL BE SENT TO YOUR REFERRING PHYSICIAN.  [place] at [facility] Discharge Instructions  RECOMMENDATIONS MADE BY THE CONSULTANT AND ANY TEST RESULTS WILL BE SENT TO YOUR REFERRING PHYSICIAN.  Zometa given today. Follow up as scheduled.  Thank you for choosing [place] at [facility] to provide your oncology and hematology care.  To afford each patient quality time with our provider, please arrive at least 15 minutes before your scheduled appointment time.   Beginning January 23rd 2017 lab work for the [facility] will be done in the  Main lab at [facility] on 1st floor. If you have a lab appointment with the [place] please come in thru the  Main Entrance and check in at the main information desk  You need to re-schedule your appointment should you arrive 10 or more minutes late.  We strive to give you quality time with our providers, and arriving late affects you and other patients whose appointments are after yours.  Also, if you no show three or more times for appointments you may be dismissed from the clinic at the providers discretion.     Again, thank you for choosing [facility].  Our hope is that these requests will decrease the amount of time that you wait before being seen by our physicians.       _____________________________________________________________  Should you have questions after your visit to [facility], please contact our office at [phone] between the hours of 8:30 a.m. and 4:30 p.m.  Voicemails left after 4:30 p.m. will not be returned until the following business day.  For prescription refill requests, have your pharmacy contact our office.         Resources For Cancer Patients and their Caregivers ? American Cancer Society: Can assist  with transportation, wigs, general needs, runs Look Good Feel Better.        [phone] ? Cancer Care: Provides financial assistance, online support groups, medication/co-pay assistance.  1-800-813-HOPE 516-773-5419) ? [place] [facility]ists [place] cancer patients and their families through emotional , educational and financial support.  936-167-9121 ? [place] DSS Where to apply for food stamps, Medicaid and utility assistance. 8543591794 ? RCATS: Transportation to medical appointments. (651) [phone] ? Social Security Administration: May apply for disability if have a Stage IV cancer. 873-413-1604 469-674-9928 ? [employer], Disability and Transit Services: Assists with nutrition, care and transit needs. [place] Support Programs: @10RELATIVEDAYS @ > Cancer Support Group  2nd Tuesday of the month 1pm-2pm, Journey Room  > Creative Journey  3rd Tuesday of the month 1130am-1pm, Journey Room  > Look Good Feel Better  1st Wednesday of the month 10am-12 noon, Journey Room (Call [place] to register [phone])    Thank you for choosing [place] at [facility] to provide your oncology and hematology care.  To afford each patient quality time with our provider, please arrive at least 15 minutes before your scheduled appointment time.   Beginning January 23rd 2017 lab work for the [facility] will be done in the  Main lab at [facility] on 1st floor. If you have a lab appointment with the [place] please come in thru the  Main Entrance and check in at the main information desk  You need to  re-schedule your appointment should you arrive 10 or more minutes late.  We strive to give you quality time with our providers, and arriving late affects you and other patients whose appointments are after yours.  Also, if you no show three or more times for appointments you may be dismissed from the  clinic at the providers discretion.     Again, thank you for choosing [facility].  Our hope is that these requests will decrease the amount of time that you wait before being seen by our physicians.       _____________________________________________________________  Should you have questions after your visit to [facility], please contact our office at [phone] between the hours of 8:30 a.m. and 4:30 p.m.  Voicemails left after 4:30 p.m. will not be returned until the following business day.  For prescription refill requests, have your pharmacy contact our office.         Resources For Cancer Patients and their Caregivers ? American Cancer Society: Can assist with transportation, wigs, general needs, runs Look Good Feel Better.        ([phone] ? Cancer Care: Provides financial assistance, online support groups, medication/co-pay assistance.  1-800-813-HOPE 779-825-3085) ? [place] [facility]ists [place] cancer patients and their families through emotional , educational and financial support.  ([phone] ? [place] DSS Where to apply for food stamps, Medicaid and utility assistance. 3394096342 ? RCATS: Transportation to medical appointments. (662) 778-6520 ? Social Security Administration: May apply for disability if have a Stage IV cancer. [phone] ? [employer], Disability and Transit Services: Assists with nutrition, care and transit needs. [place] Support Programs: @10RELATIVEDAYS @ > Cancer Support Group  2nd Tuesday of the month 1pm-2pm, Journey Room  > Creative Journey  3rd Tuesday of the month 1130am-1pm, Journey Room  > Look Good Feel Better  1st Wednesday of the month 10am-12 noon, Journey Room (Call [place] to register [phone])

## 2016-05-09 NOTE — Progress Notes (Signed)
Janet Caprice, DO [address] [place] [address]    DIAGNOSIS:  Multiple myeloma presenting with severe anemia IgG 10,600 mg/dl with total protein 13.4 g/dl 8.68 g/dl monoclonal protein IgG kappa Beta 2 microglobulin at 8.1 mg/L BMBX on 02/08/2015 with plasma cell neoplasm at 58% plasma cells Bone survey on 03/25/2015 with generalized osteopenia with slightly heterogeneous lucency in the shafts of the humeri and femora. No discrete sizable lytic lesions identified advanced osteoarthritis   CURRENT THERAPY: Revlimid/1 mg coumadin/dexamethasone   INTERVAL HISTORY: [name] 82 y.o. female returns for additional follow-up of her myeloma.  She has really done quite well on her therapy.  [name] returns to the [facility] today accompanied by her daughter and granddaughter-in-law.  At the start of the appointment, they confirm that she is eating well when she's accompanied by her family members, but they remark that they aren't sure if she is eating when she isn't with them and at her assisted living. Her daughter notes that the patient is up all night walking when she stays at her house. She also notes that she walks consistently during the day.   When asked about her recent birthday and how she feels these days, [name] says she feels good, she feels all right. The patient has few complaints.   Vitals - 1 value per visit 05/09/2016 03/01/2016 01/21/2016 01/20/2016 01/05/2016  Weight (lb) 116 118  116.2 120   Vitals - 1 value per visit 11/10/2015 11/10/2015 09/15/2015 08/18/2015  Weight (lb)  126.6 122 121.9   Vitals - 1 value per visit 07/21/2015 07/21/2015 06/23/2015  Weight (lb) 123.8  114.9    MEDICAL HISTORY: Past Medical History  Diagnosis Date  . Chronic leg pain   . Poor historian   . Psychosis   . Schizoaffective disorder   . Hypertension   . High cholesterol   . Dementia   . Thrombocytopenia ([place]) 11/10/2014  . Normocytic anemia 11/10/2014    . Elevated total protein 11/10/2014  . Multiple myeloma ([place]) 02/17/2015  . Arthritis   . Osteopenia 03/31/2015    On Bone scan on 03/25/2015    has HEMORRHOIDS, INTERNAL; GERD; OTHER DYSPHAGIA; CERVICAL MUSCLE STRAIN; COLONIC POLYPS, ADENOMATOUS, HX OF; Dementia without behavioral disturbance; Altered mental status; Essential hypertension; Routine gynecological examination; Cervicitis; Vaginal bleeding; Schizoaffective disorder, unspecified type ([place]); Vagina bleeding; Anemia; Normocytic anemia; Thrombocytopenia ([place]); Elevated total protein; Constipation; Multiple myeloma ([place]); Osteopenia; Dementia; CKD (chronic kidney disease) stage 3, GFR 30-59 ml/min; Hyperglycemia; Generalized weakness; Fever; Influenza A; and Dehydration on her problem list.     is allergic to pollen extract.     Current Outpatient Prescriptions on File Prior to Visit  Medication Sig Dispense Refill  . acetaminophen (TYLENOL) 325 MG tablet Take 650 mg by mouth every 4 (four) hours as needed for fever.    Marland Kitchen amLODipine (NORVASC) 2.5 MG tablet Take 2.5 mg by mouth daily.    . calcium carbonate (TUMS - DOSED IN MG ELEMENTAL CALCIUM) 500 MG chewable tablet Chew 2 tablets by mouth daily.    . calcium citrate-vitamin D (CITRACAL+D) 315-200 MG-UNIT tablet Take 1 tablet by mouth 2 (two) times daily.    . cholecalciferol (VITAMIN D) 1000 units tablet Take 500 Units by mouth daily.    . clonazePAM (KLONOPIN) 0.25 MG disintegrating tablet Take 0.25 mg by mouth at bedtime.    Marland Kitchen dexamethasone (DECADRON) 4 MG tablet Take 10 tablets (40 mg total) by mouth once  a week. (Patient taking differently: Take 40 mg by mouth once a week. Receives every Thursday.)    . fentaNYL (DURAGESIC - DOSED MCG/HR) 12 MCG/HR Place 1 patch (12.5 mcg total) onto the skin every 3 (three) days. (Patient taking differently: Place 12 mcg onto the skin every 3 (three) days. )    . ferrous sulfate 325 (65 FE) MG tablet Take 1 tablet (325 mg total) by mouth 3  (three) times daily with meals. (Patient taking differently: Take 325 mg by mouth 4 (four) times daily. )    . fluticasone (FLONASE) 50 MCG/ACT nasal spray Place 2 sprays into both nostrils daily.    Marland Kitchen HYDROcodone-acetaminophen (NORCO/VICODIN) 5-325 MG per tablet Take 1 tablet by mouth at bedtime as needed (pain). (Patient taking differently: Take 1 tablet by mouth every 12 (twelve) hours as needed (pain). ) 20 tablet 0  . loperamide (IMODIUM) 2 MG capsule Take 2 mg by mouth as needed for diarrhea or loose stools.    Marland Kitchen loratadine (CLARITIN) 10 MG tablet Take 10 mg by mouth daily.    . Melatonin 3 MG CAPS Take 3 mg by mouth at bedtime.     . Multiple Vitamins-Minerals (MULTIVITAMIN WITH MINERALS) tablet Take 1 tablet by mouth daily.    Marland Kitchen oseltamivir (TAMIFLU) 30 MG capsule Take 1 capsule (30 mg total) by mouth 2 (two) times daily. 6 capsule 0  . polyethylene glycol (MIRALAX / GLYCOLAX) packet Take 17 g by mouth at bedtime.     . potassium chloride SA (K-DUR,KLOR-CON) 20 MEQ tablet Take 1 tablet (20 mEq total) by mouth 2 (two) times daily. 60 tablet 2  . senna (SENOKOT) 8.6 MG TABS tablet Take 1 tablet by mouth 2 (two) times daily.    Marland Kitchen warfarin (COUMADIN) 1 MG tablet Take 1 tablet (1 mg total) by mouth daily. 30 tablet 3  . Wheat Dextrin (BENEFIBER DRINK MIX) PACK Take by mouth. Take 2 teaspoons by mouth everyday.     No current facility-administered medications on file prior to visit.     SURGICAL HISTORY: Past Surgical History  Procedure Laterality Date  . Tubal ligation    . Foot surgery    . Colonoscopy  2010    RMR: 1. Normal rectum 2. Sigmoid and cecal polyp, status post snare resection described above. Remainder of the colonic mucosa appeared normal.     SOCIAL HISTORY: Social History   Social History  . Marital Status: Widowed    Spouse Name: N/A  . Number of Children: N/A  . Years of Education: N/A   Occupational History  . Not on file.   Social History Main Topics  .  Smoking status: Never Smoker   . Smokeless tobacco: Not on file  . Alcohol Use: No  . Drug Use: No  . Sexual Activity: Not Currently    Birth Control/ Protection: Post-menopausal   Other Topics Concern  . Not on file   Social History Narrative    FAMILY HISTORY: Family History  Problem Relation Age of Onset  . Diabetes Mother     Review of Systems  Constitutional: Positive for weight loss. HENT: Positive for runny nose.   Eyes: Negative.   Respiratory:   Negative. Cardiovascular: Negative.   Gastrointestinal: Negative.  Genitourinary: Negative.  Musculoskeletal: Negative.   Skin: Negative.   Neurological: Negative.   Endo/Heme/Allergies: Negative.   Psychiatric/Behavioral: Positive for memory loss.    14 point review of systems was performed and is negative except as detailed under  history of present illness and above    PHYSICAL EXAMINATION  ECOG PERFORMANCE STATUS: 1 - Symptomatic but completely ambulatory  Filed Vitals:   05/09/16 0923  BP: 127/59  Pulse: 75  Temp: 99.9 F (37.7 C)  Resp: 20   Vitals with BMI 01/05/2016 11/10/2015 11/10/2015 09/15/2015 09/15/2015  Weight 120 lbs  126 lbs 10 oz  122 lbs   Vitals with BMI 08/18/2015 07/21/2015 07/21/2015 06/23/2015  Weight 121 lbs 14 oz  123 lbs 13 oz 114 lbs 14 oz   Physical Exam  Constitutional: She is oriented to person, well-developed, well-nourished, and in no distress. Well groomed. Gets onto the exam table today with no assistance. HENT:  Head: Normocephalic and atraumatic.  Nose: Nose normal.  Mouth/Throat: Oropharynx is clear and moist. No oropharyngeal exudate.  Eyes: Conjunctivae and EOM are normal. Pupils are equal, round, and reactive to light. Right eye exhibits no discharge. Left eye exhibits no discharge. No scleral icterus.  Neck: Normal range of motion. Neck supple. No tracheal deviation present. No thyromegaly present.  Cardiovascular: Normal rate, regular rhythm and normal heart sounds.  Exam  reveals no gallop and no friction rub.   No murmur heard. Pulmonary/Chest: Effort normal and breath sounds normal. She has no wheezes. She has no rales.  Abdominal: Soft. Bowel sounds are normal. She exhibits no distension and no mass. There is no rebound and no guarding.  Musculoskeletal: Normal range of motion. She exhibits no edema. Feet are examined bilaterally and without any bruising or skin abnormality. Lymphadenopathy:    She has no cervical adenopathy.  Neurological: She is alert and oriented to person. She has normal reflexes. No cranial nerve deficit. Gait normal although slow Coordination normal.  Skin: Skin is warm and dry. No rash noted. Psychiatric: Mood normal.  Nursing note and vitals reviewed.   LABORATORY DATA: I reviewed the results below. CBC    Component Value Date/Time   WBC 3.9* 05/09/2016 0845   RBC 3.50* 05/09/2016 0845   RBC 1.98* 04/14/2015 1816   HGB 11.8* 05/09/2016 0845   HCT 34.3* 05/09/2016 0845   PLT 156 05/09/2016 0845   MCV 98.0 05/09/2016 0845   MCH 33.7 05/09/2016 0845   MCHC 34.4 05/09/2016 0845   RDW 13.7 05/09/2016 0845   LYMPHSABS 0.9 05/09/2016 0845   MONOABS 0.3 05/09/2016 0845   EOSABS 0.2 05/09/2016 0845   BASOSABS 0.1 05/09/2016 0845   CMP     Component Value Date/Time   NA 141 05/09/2016 0845   K 3.5 05/09/2016 0845   CL 106 05/09/2016 0845   CO2 27 05/09/2016 0845   GLUCOSE 124* 05/09/2016 0845   BUN 19 05/09/2016 0845   CREATININE 0.83 05/09/2016 0845   CALCIUM 9.0 05/09/2016 0845   PROT 6.7 05/09/2016 0845   ALBUMIN 3.8 05/09/2016 0845   AST 16 05/09/2016 0845   ALT 14 05/09/2016 0845   ALKPHOS 48 05/09/2016 0845   BILITOT 0.6 05/09/2016 0845   GFRNONAA >60 05/09/2016 0845   GFRAA >60 05/09/2016 0845    BMBX on 02/08/2015 58% plasma cells, kappa restricted BONE MARROW ASPIRATE: Paucispicular, but cellular. Erythroid precursors: Overall reduced. No significant dysplasia. Granulocytic precursors: Overall  reduced. No significant dysplasia. No increase in blasts. Megakaryocytes: Present and morphologically unremarkable. Lymphocytes/plasma cells: There is a marked increase in plasma cells (58%) with atypical forms including large forms and prominent nucleoli. Lymphocytes are not increased. TOUCH PREPARATIONS: Similar to aspirate smears. CLOT and BIOPSY: The core biopsy is small, but hyper  cellular for age (80%). There is a marked increase in plasma cells, including large clusters. There is admixed trilineage hematopoiesis. There are no atypical lymphoid aggregates. The clot section does not contain marrow tissue. IRON STAIN: Iron stains are performed on a bone marrow aspirate smear and section of clot. The controls stained appropriately. There are no particles on the aspirate or clot section for evaluation. Storage Iron: N/A. Ringed Sideroblasts: N/A. ADDITIONAL DATA / TESTING: Cytogenetics was ordered, including FISH. Per records the patient has a M-spike with IgG kappa (8.68 g/dL).  RADIOLOGY: I have personally reviewed the radiological images as listed and agreed with the findings in the report.  CLINICAL DATA: Multiple myeloma.  EXAM: METASTATIC BONE SURVEY  COMPARISON: Chest radiograph 11/09/2014. Head CT 06/01/2012. CT abdomen and pelvis 05/27/2012. Bilateral knee radiographs and pelvic radiograph 02/25/2012. Lumbar spine radiographs 10/14/2010.  FINDINGS: There is diffuse, generalized osteopenia. Small areas of slightly increased, rounded lucency are present in the shafts of multiple long bones, specifically the humeri and femora, without sizable discrete lytic lesions identified.  Mild-to-moderate enlargement of the cardiac silhouette is unchanged. Calcification and tortuosity are noted of the thoracic aorta. Pulmonary vascular congestion on the prior chest radiograph has resolved. Left basilar opacity has also resolved. No evidence of acute airspace consolidation,  edema, pleural effusion, or pneumothorax.  Multilevel cervical disc degeneration is present, worst at C3-4. Mild-to-moderate thoracic dextroscoliosis is present. Multilevel thoracic and lumbar spondylosis are noted. Minimal vertebral body height loss in the mid thoracic spine appears chronic and may be degenerative. Lower lumbar facet arthrosis is present. Grade 1 anterolisthesis of L4 on L5 is stable to slightly increased from 2011 lumbar spine radiographs.  There is mild enlargement of the diploic space of the skull which is similar to the prior head CT without discrete lytic lesions identified.  Degenerative changes are noted involving the right greater than left glenohumeral joints. Moderate to severe osteoarthrosis is present involving the medial compartments of the left greater than right knees. Severe right moderate left hip osteoarthrosis are present.  IMPRESSION: 1. Generalized osteopenia with slightly heterogeneous lucency in the shafts of the humeri and femora. No discrete, sizable lytic lesions identified. 2. No acute fracture. 3. Advanced osteoarthrosis involving the shoulders, hips, and knees.   Electronically Signed  By: Logan Bores  On: 03/25/2015 14:13  ASSESSMENT and THERAPY PLAN:  Multiple myeloma, IgG kappa H/O Profound anemia Dementia Osteopenia/Lytic bone disease  She is on revlimid/dex and 1 mg coumadin. This regimen was chosen for tolerance and convenience as the patient depends upon her working daughter for transportation, and she has underlying dementia and an element of frailty.  She is doing well in regards to tolerance. Total protein has significantly declined, anemia has markedly improved and therapy has been very well tolerated.  She has stable disease.  Last M spike was 1.0 gm/dl down from 8.68 gm/dl at diagnosis, it is stable. Immunoelecrophoresis shows IgG monoclonal protein with kappa light chain specificity, immunoglobulin levels  have normalized.   In frail older adult patients, the choice of initial therapy should take into account tolerability and convenience. REV/DEX is considered a good option.   When she returns would order repeat [place].    Osteopenia/lytic bone disease  She was started on Zometa 3 mg on 03/31/2015 with no problems with tolerance. This will be continued bi-monthly. Risks and benefits of this medication were discussed in detail with the patient's daughter prior to initiation. Her daughter notes that getting the patient here has  been working out ok for her.   She will continue on her current calcium plus D.  We will continue to monitor her calcium levels.  She will return in 2 months with repeat PE and labs.  Orders Placed This Encounter  Procedures  . Kappa/lambda light chains    Standing Status: Future     Number of Occurrences:      Standing Expiration Date: 05/09/2017  . Immunofixation electrophoresis    Standing Status: Future     Number of Occurrences:      Standing Expiration Date: 05/09/2017  . Protein electrophoresis, serum    Standing Status: Future     Number of Occurrences:      Standing Expiration Date: 05/09/2017  . CBC with Differential    Standing Status: Future     Number of Occurrences:      Standing Expiration Date: 05/09/2017  . Comprehensive metabolic panel    Standing Status: Future     Number of Occurrences:      Standing Expiration Date: 05/09/2017   All questions were answered. The patient knows to call the clinic with any problems, questions or concerns. We can certainly see the patient much sooner if necessary.   This document serves as a record of services personally performed by Ancil Linsey, MD. It was created on her behalf by Toni Amend, a trained medical scribe. The creation of this record is based on the scribe's personal observations and the provider's statements to them. This document has been checked and approved by the attending  provider.  I have reviewed the above documentation for accuracy and completeness, and I agree with the above.  This note was electronically signed  Kelby Fam. Lawson Mahone MD

## 2016-05-10 LAB — IMMUNOFIXATION ELECTROPHORESIS
IGG (IMMUNOGLOBIN G), SERUM: 1384 mg/dL (ref 700–1600)
IgA: 179 mg/dL (ref 64–422)
IgM, Serum: 26 mg/dL (ref 26–217)
TOTAL PROTEIN ELP: 6.4 g/dL (ref 6.0–8.5)

## 2016-05-10 LAB — BETA 2 MICROGLOBULIN, SERUM: BETA 2 MICROGLOBULIN: 2.7 mg/L — AB (ref 0.6–2.4)

## 2016-05-10 LAB — KAPPA/LAMBDA LIGHT CHAINS
KAPPA FREE LGHT CHN: 39 mg/L — AB (ref 3.3–19.4)
Kappa, lambda light chain ratio: 2.23 — ABNORMAL HIGH (ref 0.26–1.65)
Lambda free light chains: 17.5 mg/L (ref 5.7–26.3)

## 2016-05-10 LAB — IGG, IGA, IGM
IGG (IMMUNOGLOBIN G), SERUM: 1373 mg/dL (ref 700–1600)
IGM, SERUM: 28 mg/dL (ref 26–217)
IgA: 188 mg/dL (ref 64–422)

## 2016-05-11 ENCOUNTER — Other Ambulatory Visit (HOSPITAL_COMMUNITY): Payer: Self-pay | Admitting: Oncology

## 2016-05-11 DIAGNOSIS — C9000 Multiple myeloma not having achieved remission: Secondary | ICD-10-CM

## 2016-05-11 MED ORDER — LENALIDOMIDE 10 MG PO CAPS: 10.0000 mg | ORAL_CAPSULE | Freq: Every day | ORAL | Status: DC

## 2016-05-12 LAB — PROTEIN ELECTROPHORESIS, SERUM
A/G RATIO SPE: 1.1 (ref 0.7–1.7)
ALBUMIN ELP: 3.4 g/dL (ref 2.9–4.4)
ALPHA-1-GLOBULIN: 0.2 g/dL (ref 0.0–0.4)
Alpha-2-Globulin: 0.7 g/dL (ref 0.4–1.0)
Beta Globulin: 0.9 g/dL (ref 0.7–1.3)
GAMMA GLOBULIN: 1.3 g/dL (ref 0.4–1.8)
GLOBULIN, TOTAL: 3.1 g/dL (ref 2.2–3.9)
M-SPIKE, %: 1 g/dL — AB
TOTAL PROTEIN ELP: 6.5 g/dL (ref 6.0–8.5)

## 2016-06-08 ENCOUNTER — Other Ambulatory Visit (HOSPITAL_COMMUNITY): Payer: Self-pay | Admitting: Oncology

## 2016-06-08 DIAGNOSIS — C9000 Multiple myeloma not having achieved remission: Secondary | ICD-10-CM

## 2016-06-08 MED ORDER — LENALIDOMIDE 10 MG PO CAPS: 10.0000 mg | ORAL_CAPSULE | Freq: Every day | ORAL | 0 refills | Status: DC

## 2016-07-03 NOTE — Progress Notes (Signed)
Janet Caprice, [name] [address] [place] [address]    DIAGNOSIS:  Multiple myeloma presenting with severe anemia IgG 10,600 mg/dl with total protein 13.4 g/dl 8.68 g/dl monoclonal protein IgG kappa Beta 2 microglobulin at 8.1 mg/L BMBX on 02/08/2015 with plasma cell neoplasm at 58% plasma cells Bone survey on 03/25/2015 with generalized osteopenia with slightly heterogeneous lucency in the shafts of the humeri and femora. No discrete sizable lytic lesions identified advanced osteoarthritis   CURRENT THERAPY: Revlimid/1 mg coumadin/dexamethasone   INTERVAL HISTORY: [name] 82 y.o. female returns for additional follow-up of her myeloma.  She has really done quite well on her therapy.  [name] returns to the [facility] today accompanied by her daughter and granddaughter-in-law. She continues to reside in a personal care home but comes to intermittently stay with her daughter. Weight is up several pounds.   Her daughter notes that the patient is up all night walking or watching TV when she stays at her house. She also notes that she walks consistently during the day.   The patient has few complaints.   MEDICAL HISTORY: Past Medical History:  Diagnosis Date  . Arthritis   . Chronic leg pain   . Dementia   . Elevated total protein 11/10/2014  . High cholesterol   . Hypertension   . Multiple myeloma ([place]) 02/17/2015  . Normocytic anemia 11/10/2014  . Osteopenia 03/31/2015   On Bone scan on 03/25/2015  . Poor historian   . Psychosis   . Schizoaffective disorder   . Thrombocytopenia ([place]) 11/10/2014    has HEMORRHOIDS, INTERNAL; GERD; OTHER DYSPHAGIA; CERVICAL MUSCLE STRAIN; COLONIC POLYPS, ADENOMATOUS, HX OF; Dementia without behavioral disturbance; Altered mental status; Essential hypertension; Routine gynecological examination; Cervicitis; Vaginal bleeding; Schizoaffective disorder, unspecified type ([place]); Vagina bleeding; Anemia; Normocytic  anemia; Thrombocytopenia ([place]); Elevated total protein; Constipation; Multiple myeloma ([place]); Osteopenia; Dementia; CKD (chronic kidney disease) stage 3, GFR 30-59 ml/min; Hyperglycemia; Generalized weakness; Fever; Influenza A; and Dehydration on her problem list.     is allergic to pollen extract.     Current Outpatient Prescriptions on File Prior to Visit  Medication Sig Dispense Refill  . acetaminophen (TYLENOL) 325 MG tablet Take 650 mg by mouth every 4 (four) hours as needed for fever.    Marland Kitchen amLODipine (NORVASC) 2.5 MG tablet Take 2.5 mg by mouth daily.    . calcium carbonate (TUMS - DOSED IN MG ELEMENTAL CALCIUM) 500 MG chewable tablet Chew 2 tablets by mouth daily.    . calcium citrate-vitamin D (CITRACAL+D) 315-200 MG-UNIT tablet Take 1 tablet by mouth 2 (two) times daily.    . cholecalciferol (VITAMIN D) 1000 units tablet Take 500 Units by mouth daily.    . clonazePAM (KLONOPIN) 0.25 MG disintegrating tablet Take 0.25 mg by mouth at bedtime.    Marland Kitchen dexamethasone (DECADRON) 4 MG tablet Take 10 tablets (40 mg total) by mouth once a week. (Patient taking differently: Take 40 mg by mouth once a week. Receives every Thursday.)    . fentaNYL (DURAGESIC - DOSED MCG/HR) 12 MCG/HR Place 1 patch (12.5 mcg total) onto the skin every 3 (three) days. (Patient taking differently: Place 12 mcg onto the skin every 3 (three) days. )    . ferrous sulfate 325 (65 FE) MG tablet Take 1 tablet (325 mg total) by mouth 3 (three) times daily with meals. (Patient taking differently: Take 325 mg by mouth 4 (four) times daily. )    . fluticasone (  FLONASE) 50 MCG/ACT nasal spray Place 2 sprays into both nostrils daily.    Marland Kitchen HYDROcodone-acetaminophen (NORCO/VICODIN) 5-325 MG per tablet Take 1 tablet by mouth at bedtime as needed (pain). (Patient taking differently: Take 1 tablet by mouth every 12 (twelve) hours as needed (pain). ) 20 tablet 0  . loperamide (IMODIUM) 2 MG capsule Take 2 mg by mouth as needed for diarrhea  or loose stools.    Marland Kitchen loratadine (CLARITIN) 10 MG tablet Take 10 mg by mouth daily.    . Melatonin 3 MG CAPS Take 3 mg by mouth at bedtime.     . Multiple Vitamins-Minerals (MULTIVITAMIN WITH MINERALS) tablet Take 1 tablet by mouth daily.    Marland Kitchen oseltamivir (TAMIFLU) 30 MG capsule Take 1 capsule (30 mg total) by mouth 2 (two) times daily. 6 capsule 0  . polyethylene glycol (MIRALAX / GLYCOLAX) packet Take 17 g by mouth at bedtime.     . potassium chloride SA (K-DUR,KLOR-CON) 20 MEQ tablet Take 1 tablet (20 mEq total) by mouth 2 (two) times daily. 60 tablet 2  . senna (SENOKOT) 8.6 MG TABS tablet Take 1 tablet by mouth 2 (two) times daily.    Marland Kitchen warfarin (COUMADIN) 1 MG tablet Take 1 tablet (1 mg total) by mouth daily. 30 tablet 3  . Wheat Dextrin (BENEFIBER DRINK MIX) PACK Take by mouth. Take 2 teaspoons by mouth everyday.     No current facility-administered medications on file prior to visit.      SURGICAL HISTORY: Past Surgical History:  Procedure Laterality Date  . COLONOSCOPY  2010   RMR: 1. Normal rectum 2. Sigmoid and cecal polyp, status post snare resection described above. Remainder of the colonic mucosa appeared normal.   . FOOT SURGERY    . TUBAL LIGATION      SOCIAL HISTORY: Social History   Social History  . Marital status: Widowed    Spouse name: N/A  . Number of children: N/A  . Years of education: N/A   Occupational History  . Not on file.   Social History Main Topics  . Smoking status: Never Smoker  . Smokeless tobacco: Never Used  . Alcohol use No  . Drug use: No  . Sexual activity: Not Currently    Birth control/ protection: Post-menopausal   Other Topics Concern  . Not on file   Social History Narrative  . No narrative on file    FAMILY HISTORY: Family History  Problem Relation Age of Onset  . Diabetes Mother     Review of Systems  Constitutional: Negative for fever, weight loss HENT: Positive for runny nose, allergies.   Eyes: Negative.     Respiratory:   Negative. Cardiovascular: Negative.   Gastrointestinal: Negative.  Genitourinary: Negative.  Musculoskeletal: Negative.   Skin: Negative.   Neurological: Negative.   Endo/Heme/Allergies: Negative.   Psychiatric/Behavioral: Positive for memory loss.    14 point review of systems was performed and is negative except as detailed under history of present illness and above   PHYSICAL EXAMINATION  ECOG PERFORMANCE STATUS: 1 - Symptomatic but completely ambulatory  BP (!) 144/70 (BP Location: Right Arm, Patient Position: Sitting)   Pulse 70   Temp 97.9 F (36.6 C) (Oral)   Resp 16   Wt 120 lb 9.6 oz (54.7 kg)   SpO2 100%   BMI 22.06 kg/m   Physical Exam  Constitutional: She is oriented to person, well-developed, well-nourished, and in no distress. Well groomed. Gets onto the exam table today  with no assistance. HENT:  Head: Normocephalic and atraumatic.  Nose: Nose normal.  Mouth/Throat: Oropharynx is clear and moist. No oropharyngeal exudate.  Eyes: Conjunctivae and EOM are normal. Pupils are equal, round, and reactive to light. Right eye exhibits no discharge. Left eye exhibits no discharge. No scleral icterus.  Neck: Normal range of motion. Neck supple. No tracheal deviation present. No thyromegaly present.  Cardiovascular: Normal rate, regular rhythm and normal heart sounds.  Exam reveals no gallop and no friction rub.   No murmur heard. Pulmonary/Chest: Effort normal and breath sounds normal. She has no wheezes. She has no rales.  Abdominal: Soft. Bowel sounds are normal. She exhibits no distension and no mass. There is no rebound and no guarding.  Musculoskeletal: Normal range of motion. She exhibits no edema. Feet are examined bilaterally and without any bruising or skin abnormality. Lymphadenopathy:    She has no cervical adenopathy.  Neurological: She is alert and oriented to person. She has normal reflexes. No cranial nerve deficit. Gait normal although  slow Coordination normal.  Skin: Skin is warm and dry. No rash noted. Psychiatric: Mood normal.  Nursing note and vitals reviewed.   LABORATORY DATA: I reviewed the results below. CBC    Component Value Date/Time   WBC 2.7 (L) 07/04/2016 0959   RBC 3.81 (L) 07/04/2016 0959   HGB 12.6 07/04/2016 0959   HCT 37.0 07/04/2016 0959   PLT 112 (L) 07/04/2016 0959   MCV 97.1 07/04/2016 0959   MCH 33.1 07/04/2016 0959   MCHC 34.1 07/04/2016 0959   RDW 14.2 07/04/2016 0959   LYMPHSABS 0.7 07/04/2016 0959   MONOABS 0.4 07/04/2016 0959   EOSABS 0.2 07/04/2016 0959   BASOSABS 0.1 07/04/2016 0959   CMP     Component Value Date/Time   NA 140 07/04/2016 0959   K 3.7 07/04/2016 0959   CL 104 07/04/2016 0959   CO2 30 07/04/2016 0959   GLUCOSE 100 (H) 07/04/2016 0959   BUN 23 (H) 07/04/2016 0959   CREATININE 1.01 (H) 07/04/2016 0959   CALCIUM 9.1 07/04/2016 0959   PROT 7.0 07/04/2016 0959   ALBUMIN 3.9 07/04/2016 0959   AST 20 07/04/2016 0959   ALT 19 07/04/2016 0959   ALKPHOS 45 07/04/2016 0959   BILITOT 0.6 07/04/2016 0959   GFRNONAA 50 (L) 07/04/2016 0959   GFRAA 58 (L) 07/04/2016 0959    BMBX on 02/08/2015 58% plasma cells, kappa restricted BONE MARROW ASPIRATE: Paucispicular, but cellular. Erythroid precursors: Overall reduced. No significant dysplasia. Granulocytic precursors: Overall reduced. No significant dysplasia. No increase in blasts. Megakaryocytes: Present and morphologically unremarkable. Lymphocytes/plasma cells: There is a marked increase in plasma cells (58%) with atypical forms including large forms and prominent nucleoli. Lymphocytes are not increased. TOUCH PREPARATIONS: Similar to aspirate smears. CLOT and BIOPSY: The core biopsy is small, but hyper cellular for age (80%). There is a marked increase in plasma cells, including large clusters. There is admixed trilineage hematopoiesis. There are no atypical lymphoid aggregates. The clot section does not  contain marrow tissue. IRON STAIN: Iron stains are performed on a bone marrow aspirate smear and section of clot. The controls stained appropriately. There are no particles on the aspirate or clot section for evaluation. Storage Iron: N/A. Ringed Sideroblasts: N/A. ADDITIONAL DATA / TESTING: Cytogenetics was ordered, including FISH. Per records the patient has a M-spike with IgG kappa (8.68 g/dL).  RADIOLOGY: I have personally reviewed the radiological images as listed and agreed with the findings in the report.  Study Result   CLINICAL DATA:  Fever.  History of hypertension and dementia.  EXAM: PORTABLE CHEST 1 VIEW  COMPARISON:  04/15/2015 and 04/14/2015.  FINDINGS: 1041 hours. The heart size and mediastinal contours are stable. There is mild brachiocephalic tortuosity. There are lower lung volumes with mildly increased atelectasis at the left lung base. No edema, confluent airspace opacity or pleural effusion is seen. There are advanced glenohumeral degenerative changes, asymmetric to the right.  IMPRESSION: Mildly increased left basilar atelectasis attributed to suboptimal inspiration. No evidence of pneumonia.   Electronically Signed   By: Richardean Sale M.D.   On: 01/20/2016 10:59    ASSESSMENT and THERAPY PLAN:  Multiple myeloma, IgG kappa H/O Profound anemia Dementia Osteopenia/Lytic bone disease  She is on revlimid/dex and 1 mg coumadin. This regimen was chosen for tolerance and convenience as the patient depends upon her working daughter for transportation, and she has underlying dementia and an element of frailty.  She is doing well in regards to tolerance. Total protein has significantly declined, anemia has markedly improved and therapy has been very well tolerated.  She has stable disease.  Last M spike was 1.0 gm/dl down from 8.68 gm/dl at diagnosis, it is stable. Immunoelecrophoresis shows IgG monoclonal protein with kappa light chain specificity,  immunoglobulin levels have normalized. Myeloma labs today are pending, will notify her daughter if there is any significant change.   In frail older adult patients, the choice of initial therapy should take into account tolerability and convenience. REV/DEX is considered a good option.   When she returns would order repeat [place].    Osteopenia/lytic bone disease  She was started on Zometa 3 mg on 03/31/2015 with no problems with tolerance. This will be continued bi-monthly. Risks and benefits of this medication were discussed in detail with the patient's daughter prior to initiation. Her daughter notes that getting the patient here has been working out ok for her.   She will continue on her current calcium plus D.  We will continue to monitor her calcium levels.  She will return in 2 months with repeat PE and labs.  Orders Placed This Encounter  Procedures  . CBC with Differential    Standing Status:   Future    Standing Expiration Date:   07/04/2017  . Comprehensive metabolic panel    Standing Status:   Future    Standing Expiration Date:   07/04/2017   All questions were answered. The patient knows to call the clinic with any problems, questions or concerns. We can certainly see the patient much sooner if necessary.   This document serves as a record of services personally performed by Ancil Linsey, MD. It was created on her behalf by Arlyce Harman, a trained medical scribe. The creation of this record is based on the scribe's personal observations and the provider's statements to them. This document has been checked and approved by the attending provider.  I have reviewed the above documentation for accuracy and completeness, and I agree with the above.  This note was electronically signed  Kelby Fam. Penland MD

## 2016-07-04 ENCOUNTER — Other Ambulatory Visit (HOSPITAL_COMMUNITY): Payer: Self-pay | Admitting: Oncology

## 2016-07-04 ENCOUNTER — Encounter (HOSPITAL_COMMUNITY): Payer: Medicare Other | Attending: Family Medicine | Admitting: Hematology & Oncology

## 2016-07-04 ENCOUNTER — Encounter (HOSPITAL_COMMUNITY): Payer: Self-pay | Admitting: Hematology & Oncology

## 2016-07-04 ENCOUNTER — Encounter (HOSPITAL_BASED_OUTPATIENT_CLINIC_OR_DEPARTMENT_OTHER): Payer: Medicare Other

## 2016-07-04 ENCOUNTER — Encounter (HOSPITAL_COMMUNITY): Payer: Medicare Other

## 2016-07-04 VITALS — BP 144/70 | HR 70 | Temp 97.9°F | Resp 16 | Wt 120.6 lb

## 2016-07-04 DIAGNOSIS — D649 Anemia, unspecified: Secondary | ICD-10-CM | POA: Insufficient documentation

## 2016-07-04 DIAGNOSIS — F0390 Unspecified dementia without behavioral disturbance: Secondary | ICD-10-CM

## 2016-07-04 DIAGNOSIS — C9000 Multiple myeloma not having achieved remission: Secondary | ICD-10-CM

## 2016-07-04 DIAGNOSIS — C9001 Multiple myeloma in remission: Secondary | ICD-10-CM

## 2016-07-04 LAB — COMPREHENSIVE METABOLIC PANEL
ALBUMIN: 3.9 g/dL (ref 3.5–5.0)
ALK PHOS: 45 U/L (ref 38–126)
ALT: 19 U/L (ref 14–54)
AST: 20 U/L (ref 15–41)
Anion gap: 6 (ref 5–15)
BILIRUBIN TOTAL: 0.6 mg/dL (ref 0.3–1.2)
BUN: 23 mg/dL — AB (ref 6–20)
CO2: 30 mmol/L (ref 22–32)
CREATININE: 1.01 mg/dL — AB (ref 0.44–1.00)
Calcium: 9.1 mg/dL (ref 8.9–10.3)
Chloride: 104 mmol/L (ref 101–111)
GFR calc Af Amer: 58 mL/min — ABNORMAL LOW (ref >60)
GFR, EST NON AFRICAN AMERICAN: 50 mL/min — AB (ref >60)
GLUCOSE: 100 mg/dL — AB (ref 65–99)
Potassium: 3.7 mmol/L (ref 3.5–5.1)
Sodium: 140 mmol/L (ref 135–145)
TOTAL PROTEIN: 7 g/dL (ref 6.5–8.1)

## 2016-07-04 LAB — CBC WITH DIFFERENTIAL/PLATELET
BASOS ABS: 0.1 10*3/uL (ref 0.0–0.1)
Basophils Relative: 2 %
EOS PCT: 7 %
Eosinophils Absolute: 0.2 10*3/uL (ref 0.0–0.7)
HEMATOCRIT: 37 % (ref 36.0–46.0)
HEMOGLOBIN: 12.6 g/dL (ref 12.0–15.0)
LYMPHS ABS: 0.7 10*3/uL (ref 0.7–4.0)
LYMPHS PCT: 27 %
MCH: 33.1 pg (ref 26.0–34.0)
MCHC: 34.1 g/dL (ref 30.0–36.0)
MCV: 97.1 fL (ref 78.0–100.0)
MONOS PCT: 15 %
Monocytes Absolute: 0.4 10*3/uL (ref 0.1–1.0)
NEUTROS PCT: 49 %
Neutro Abs: 1.3 10*3/uL — ABNORMAL LOW (ref 1.7–7.7)
Platelets: 112 10*3/uL — ABNORMAL LOW (ref 150–400)
RBC: 3.81 MIL/uL — AB (ref 3.87–5.11)
RDW: 14.2 % (ref 11.5–15.5)
WBC: 2.7 10*3/uL — AB (ref 4.0–10.5)

## 2016-07-04 MED ORDER — SODIUM CHLORIDE 0.9 % IV SOLN
Freq: Once | INTRAVENOUS | Status: AC
  Administered 2016-07-04: 12:00:00 via INTRAVENOUS

## 2016-07-04 MED ORDER — LORATADINE 10 MG PO TABS: 10.0000 mg | ORAL_TABLET | Freq: Every day | ORAL | 3 refills | Status: DC

## 2016-07-04 MED ORDER — LENALIDOMIDE 10 MG PO CAPS: 10.0000 mg | ORAL_CAPSULE | Freq: Every day | ORAL | 0 refills | Status: DC

## 2016-07-04 MED ORDER — ZOLEDRONIC ACID 4 MG/5ML IV CONC
3.0000 mg | Freq: Once | INTRAVENOUS | Status: AC
  Administered 2016-07-04: 3 mg via INTRAVENOUS
  Filled 2016-07-04: qty 3.75

## 2016-07-04 NOTE — Progress Notes (Signed)
Zometa infusion given today per orders. Patient tolerated well without problems

## 2016-07-04 NOTE — Patient Instructions (Addendum)
[place] at [facility] Discharge Instructions  RECOMMENDATIONS MADE BY THE CONSULTANT AND ANY TEST RESULTS WILL BE SENT TO YOUR REFERRING PHYSICIAN.  You were seen by Dr. Whitney Muse today. Return to center in  8 weeks with labs and Zometa to see [facility] in 2 weeks. Please call the center with any related concerns.   Thank you for choosing [place] at [facility] to provide your oncology and hematology care.  To afford each patient quality time with our provider, please arrive at least 15 minutes before your scheduled appointment time.   Beginning January 23rd 2017 lab work for the [facility] will be done in the  Main lab at [facility] on 1st floor. If you have a lab appointment with the [facility] please come in thru the  Main Entrance and check in at the main information desk  You need to re-schedule your appointment should you arrive 10 or more minutes late.  We strive to give you quality time with our providers, and arriving late affects you and other patients whose appointments are after yours.  Also, if you no show three or more times for appointments you may be dismissed from the clinic at the providers discretion.     Again, thank you for choosing [facility].  Our hope is that these requests will decrease the amount of time that you wait before being seen by our physicians.       _____________________________________________________________  Should you have questions after your visit to [facility], please contact our office at [phone] between the hours of 8:30 a.m. and 4:30 p.m.  Voicemails left after 4:30 p.m. will not be returned until the following business day.  For prescription refill requests, have your pharmacy contact our office.         Resources For Cancer Patients and their Caregivers ? American Cancer Society: Can assist with transportation, wigs, general needs, runs Look Good  Feel Better.        8083129732 ? Cancer Care: Provides financial assistance, online support groups, medication/co-pay assistance.  1-800-813-HOPE 313 716 4211) ? [place] Assists [place] cancer patients and their families through emotional , educational and financial support.  719 029 0344 ? [place] DSS Where to apply for food stamps, Medicaid and utility assistance. (912)468-4593 ? RCATS: Transportation to medical appointments. (984)345-0214 ? Social Security Administration: May apply for disability if have a Stage IV cancer. 778 288 7107 (828) 700-6766 ? [employer], Disability and Transit Services: Assists with nutrition, care and transit needs. [place] Support Programs: @10RELATIVEDAYS @ > Cancer Support Group  2nd Tuesday of the month 1pm-2pm, Journey Room  > Creative Journey  3rd Tuesday of the month 1130am-1pm, Journey Room  > Look Good Feel Better  1st Wednesday of the month 10am-12 noon, Journey Room (Call [place] to register [phone])

## 2016-07-04 NOTE — Patient Instructions (Signed)
[place] at [facility] Discharge Instructions  RECOMMENDATIONS MADE BY THE CONSULTANT AND ANY TEST RESULTS WILL BE SENT TO YOUR REFERRING PHYSICIAN.  Zometa given today. Call for any concerns or questions  Thank you for choosing [place] at [facility] to provide your oncology and hematology care.  To afford each patient quality time with our provider, please arrive at least 15 minutes before your scheduled appointment time.   Beginning January 23rd 2017 lab work for the [facility] will be done in the  Main lab at [facility] on 1st floor. If you have a lab appointment with the [facility] please come in thru the  Main Entrance and check in at the main information desk  You need to re-schedule your appointment should you arrive 10 or more minutes late.  We strive to give you quality time with our providers, and arriving late affects you and other patients whose appointments are after yours.  Also, if you no show three or more times for appointments you may be dismissed from the clinic at the providers discretion.     Again, thank you for choosing [facility].  Our hope is that these requests will decrease the amount of time that you wait before being seen by our physicians.       _____________________________________________________________  Should you have questions after your visit to [facility], please contact our office at [phone] between the hours of 8:30 a.m. and 4:30 p.m.  Voicemails left after 4:30 p.m. will not be returned until the following business day.  For prescription refill requests, have your pharmacy contact our office.         Resources For Cancer Patients and their Caregivers ? American Cancer Society: Can assist with transportation, wigs, general needs, runs Look Good Feel Better.        (303)238-2229 ? Cancer Care: Provides financial assistance, online support groups,  medication/co-pay assistance.  1-800-813-HOPE (270) 785-4079) ? [place] Assists [place] cancer patients and their families through emotional , educational and financial support.  775 066 9131 ? [place] DSS Where to apply for food stamps, Medicaid and utility assistance. 706-818-8390 ? RCATS: Transportation to medical appointments. 6173781318 ? Social Security Administration: May apply for disability if have a Stage IV cancer. 317-220-2244 813 366 0138 ? [employer], Disability and Transit Services: Assists with nutrition, care and transit needs. [place] Support Programs: @10RELATIVEDAYS @ > Cancer Support Group  2nd Tuesday of the month 1pm-2pm, Journey Room  > Creative Journey  3rd Tuesday of the month 1130am-1pm, Journey Room  > Look Good Feel Better  1st Wednesday of the month 10am-12 noon, Journey Room (Call [place] to register [phone])

## 2016-07-05 LAB — PROTEIN ELECTROPHORESIS, SERUM
A/G Ratio: 1.2 (ref 0.7–1.7)
Albumin ELP: 3.6 g/dL (ref 2.9–4.4)
Alpha-1-Globulin: 0.2 g/dL (ref 0.0–0.4)
Alpha-2-Globulin: 0.7 g/dL (ref 0.4–1.0)
Beta Globulin: 0.9 g/dL (ref 0.7–1.3)
Gamma Globulin: 1.1 g/dL (ref 0.4–1.8)
Globulin, Total: 2.9 g/dL (ref 2.2–3.9)
M-Spike, %: 0.9 g/dL — ABNORMAL HIGH
Total Protein ELP: 6.5 g/dL (ref 6.0–8.5)

## 2016-07-05 LAB — IMMUNOFIXATION ELECTROPHORESIS
IGA: 159 mg/dL (ref 64–422)
IGG (IMMUNOGLOBIN G), SERUM: 1224 mg/dL (ref 700–1600)
IGM, SERUM: 20 mg/dL — AB (ref 26–217)
TOTAL PROTEIN ELP: 6.6 g/dL (ref 6.0–8.5)

## 2016-07-05 LAB — KAPPA/LAMBDA LIGHT CHAINS
Kappa free light chain: 33.3 mg/L — ABNORMAL HIGH (ref 3.3–19.4)
Kappa, lambda light chain ratio: 1.85 — ABNORMAL HIGH (ref 0.26–1.65)
Lambda free light chains: 18 mg/L (ref 5.7–26.3)

## 2016-07-18 ENCOUNTER — Other Ambulatory Visit (HOSPITAL_COMMUNITY): Payer: Self-pay

## 2016-07-25 ENCOUNTER — Other Ambulatory Visit (HOSPITAL_COMMUNITY): Payer: Self-pay

## 2016-07-27 ENCOUNTER — Other Ambulatory Visit (HOSPITAL_COMMUNITY): Payer: Self-pay | Admitting: Pharmacist

## 2016-07-31 ENCOUNTER — Other Ambulatory Visit (HOSPITAL_COMMUNITY): Payer: Self-pay

## 2016-08-01 ENCOUNTER — Other Ambulatory Visit (HOSPITAL_COMMUNITY): Payer: Self-pay | Admitting: Emergency Medicine

## 2016-08-01 DIAGNOSIS — C9000 Multiple myeloma not having achieved remission: Secondary | ICD-10-CM

## 2016-08-01 MED ORDER — LENALIDOMIDE 10 MG PO CAPS: 10.0000 mg | ORAL_CAPSULE | Freq: Every day | ORAL | 0 refills | Status: DC

## 2016-08-03 ENCOUNTER — Other Ambulatory Visit (HOSPITAL_COMMUNITY): Payer: Self-pay

## 2016-08-21 ENCOUNTER — Other Ambulatory Visit (HOSPITAL_COMMUNITY): Payer: Self-pay | Admitting: Emergency Medicine

## 2016-08-21 DIAGNOSIS — C9000 Multiple myeloma not having achieved remission: Secondary | ICD-10-CM

## 2016-08-21 MED ORDER — LENALIDOMIDE 10 MG PO CAPS: 10.0000 mg | ORAL_CAPSULE | Freq: Every day | ORAL | 0 refills | Status: DC

## 2016-08-21 NOTE — Progress Notes (Signed)
revlimid refilled

## 2016-08-29 ENCOUNTER — Ambulatory Visit (HOSPITAL_COMMUNITY): Payer: Self-pay | Admitting: Oncology

## 2016-08-29 ENCOUNTER — Ambulatory Visit (HOSPITAL_COMMUNITY): Payer: Self-pay

## 2016-08-29 ENCOUNTER — Other Ambulatory Visit (HOSPITAL_COMMUNITY): Payer: Self-pay

## 2016-09-05 ENCOUNTER — Emergency Department (HOSPITAL_COMMUNITY)
Admission: EM | Admit: 2016-09-05 | Discharge: 2016-09-05 | Disposition: A | Discharge status: Home / Self Care | Payer: Medicare Other | Attending: Emergency Medicine | Admitting: Emergency Medicine

## 2016-09-05 ENCOUNTER — Encounter (HOSPITAL_COMMUNITY): Payer: Medicare Other | Attending: Oncology | Admitting: Oncology

## 2016-09-05 ENCOUNTER — Encounter (HOSPITAL_COMMUNITY): Payer: Self-pay | Admitting: Oncology

## 2016-09-05 ENCOUNTER — Emergency Department (HOSPITAL_COMMUNITY): Payer: Medicare Other

## 2016-09-05 ENCOUNTER — Encounter (HOSPITAL_COMMUNITY): Payer: Medicare Other

## 2016-09-05 ENCOUNTER — Ambulatory Visit (HOSPITAL_COMMUNITY)
Admission: RE | Admit: 2016-09-05 | Discharge: 2016-09-05 | Disposition: A | Discharge status: Home / Self Care | Payer: Medicare Other | Source: Ambulatory Visit | Attending: Oncology | Admitting: Oncology

## 2016-09-05 ENCOUNTER — Encounter (HOSPITAL_COMMUNITY): Payer: Self-pay | Admitting: *Deleted

## 2016-09-05 VITALS — BP 137/62 | HR 64 | Temp 98.3°F | Resp 18 | Wt 118.7 lb

## 2016-09-05 DIAGNOSIS — Z9889 Other specified postprocedural states: Secondary | ICD-10-CM | POA: Insufficient documentation

## 2016-09-05 DIAGNOSIS — Z833 Family history of diabetes mellitus: Secondary | ICD-10-CM | POA: Insufficient documentation

## 2016-09-05 DIAGNOSIS — Y929 Unspecified place or not applicable: Secondary | ICD-10-CM | POA: Insufficient documentation

## 2016-09-05 DIAGNOSIS — I129 Hypertensive chronic kidney disease with stage 1 through stage 4 chronic kidney disease, or unspecified chronic kidney disease: Secondary | ICD-10-CM | POA: Insufficient documentation

## 2016-09-05 DIAGNOSIS — C9001 Multiple myeloma in remission: Secondary | ICD-10-CM

## 2016-09-05 DIAGNOSIS — M8580 Other specified disorders of bone density and structure, unspecified site: Secondary | ICD-10-CM | POA: Insufficient documentation

## 2016-09-05 DIAGNOSIS — R296 Repeated falls: Secondary | ICD-10-CM | POA: Diagnosis not present

## 2016-09-05 DIAGNOSIS — D649 Anemia, unspecified: Secondary | ICD-10-CM | POA: Insufficient documentation

## 2016-09-05 DIAGNOSIS — C9000 Multiple myeloma not having achieved remission: Secondary | ICD-10-CM

## 2016-09-05 DIAGNOSIS — Y92009 Unspecified place in unspecified non-institutional (private) residence as the place of occurrence of the external cause: Secondary | ICD-10-CM

## 2016-09-05 DIAGNOSIS — S0003XA Contusion of scalp, initial encounter: Secondary | ICD-10-CM | POA: Diagnosis not present

## 2016-09-05 DIAGNOSIS — W1839XA Other fall on same level, initial encounter: Secondary | ICD-10-CM | POA: Insufficient documentation

## 2016-09-05 DIAGNOSIS — Z79899 Other long term (current) drug therapy: Secondary | ICD-10-CM | POA: Insufficient documentation

## 2016-09-05 DIAGNOSIS — W19XXXA Unspecified fall, initial encounter: Secondary | ICD-10-CM

## 2016-09-05 DIAGNOSIS — Y939 Activity, unspecified: Secondary | ICD-10-CM | POA: Diagnosis not present

## 2016-09-05 DIAGNOSIS — I10 Essential (primary) hypertension: Secondary | ICD-10-CM | POA: Diagnosis not present

## 2016-09-05 DIAGNOSIS — Z9221 Personal history of antineoplastic chemotherapy: Secondary | ICD-10-CM | POA: Insufficient documentation

## 2016-09-05 DIAGNOSIS — S0990XA Unspecified injury of head, initial encounter: Secondary | ICD-10-CM | POA: Diagnosis present

## 2016-09-05 DIAGNOSIS — N183 Chronic kidney disease, stage 3 (moderate): Secondary | ICD-10-CM | POA: Insufficient documentation

## 2016-09-05 DIAGNOSIS — M1990 Unspecified osteoarthritis, unspecified site: Secondary | ICD-10-CM | POA: Insufficient documentation

## 2016-09-05 DIAGNOSIS — Y999 Unspecified external cause status: Secondary | ICD-10-CM | POA: Insufficient documentation

## 2016-09-05 DIAGNOSIS — I700 Atherosclerosis of aorta: Secondary | ICD-10-CM | POA: Diagnosis not present

## 2016-09-05 DIAGNOSIS — R634 Abnormal weight loss: Secondary | ICD-10-CM | POA: Insufficient documentation

## 2016-09-05 DIAGNOSIS — F0390 Unspecified dementia without behavioral disturbance: Secondary | ICD-10-CM | POA: Diagnosis not present

## 2016-09-05 DIAGNOSIS — M1611 Unilateral primary osteoarthritis, right hip: Secondary | ICD-10-CM | POA: Insufficient documentation

## 2016-09-05 DIAGNOSIS — E7800 Pure hypercholesterolemia, unspecified: Secondary | ICD-10-CM | POA: Insufficient documentation

## 2016-09-05 DIAGNOSIS — G8929 Other chronic pain: Secondary | ICD-10-CM | POA: Insufficient documentation

## 2016-09-05 LAB — CBC WITH DIFFERENTIAL/PLATELET
BASOS ABS: 0.1 10*3/uL (ref 0.0–0.1)
Basophils Relative: 2 %
Eosinophils Absolute: 0.1 10*3/uL (ref 0.0–0.7)
Eosinophils Relative: 2 %
HCT: 34.5 % — ABNORMAL LOW (ref 36.0–46.0)
HEMOGLOBIN: 12.2 g/dL (ref 12.0–15.0)
LYMPHS ABS: 1 10*3/uL (ref 0.7–4.0)
LYMPHS PCT: 30 %
MCH: 34.2 pg — AB (ref 26.0–34.0)
MCHC: 35.4 g/dL (ref 30.0–36.0)
MCV: 96.6 fL (ref 78.0–100.0)
Monocytes Absolute: 0.3 10*3/uL (ref 0.1–1.0)
Monocytes Relative: 8 %
NEUTROS PCT: 57 %
Neutro Abs: 1.9 10*3/uL (ref 1.7–7.7)
Platelets: 114 10*3/uL — ABNORMAL LOW (ref 150–400)
RBC: 3.57 MIL/uL — AB (ref 3.87–5.11)
RDW: 14 % (ref 11.5–15.5)
WBC: 3.3 10*3/uL — AB (ref 4.0–10.5)

## 2016-09-05 LAB — PROTIME-INR
INR: 1.1
PROTHROMBIN TIME: 14.2 s (ref 11.4–15.2)

## 2016-09-05 LAB — COMPREHENSIVE METABOLIC PANEL
ALK PHOS: 61 U/L (ref 38–126)
ALT: 23 U/L (ref 14–54)
AST: 19 U/L (ref 15–41)
Albumin: 3.7 g/dL (ref 3.5–5.0)
Anion gap: 5 (ref 5–15)
BUN: 19 mg/dL (ref 6–20)
CALCIUM: 8.5 mg/dL — AB (ref 8.9–10.3)
CHLORIDE: 104 mmol/L (ref 101–111)
CO2: 27 mmol/L (ref 22–32)
CREATININE: 0.8 mg/dL (ref 0.44–1.00)
Glucose, Bld: 107 mg/dL — ABNORMAL HIGH (ref 65–99)
Potassium: 3.7 mmol/L (ref 3.5–5.1)
SODIUM: 136 mmol/L (ref 135–145)
Total Bilirubin: 0.6 mg/dL (ref 0.3–1.2)
Total Protein: 6.4 g/dL — ABNORMAL LOW (ref 6.5–8.1)

## 2016-09-05 MED ORDER — ACETAMINOPHEN 325 MG PO TABS
650.0000 mg | ORAL_TABLET | Freq: Once | ORAL | Status: AC
  Administered 2016-09-05: 650 mg via ORAL
  Filled 2016-09-05: qty 2

## 2016-09-05 NOTE — Progress Notes (Signed)
[name] [address] [place] [address]  Multiple myeloma in remission [facility]) - Plan: Kappa/lambda light chains, IgG, IgA, IgM, Beta 2 microglobuline, serum, Immunofixation electrophoresis, Protein electrophoresis, serum, CBC with Differential, Comprehensive metabolic panel, DG Bone Survey Met  CURRENT THERAPY: RD and 1 mg of Coumadin daily and Zometa every 8 weeks  INTERVAL HISTORY: [name] 82 y.o. female returns for followup of IgG multiple myeloma presenting with severe anemia.   BMBX on 02/08/2015 with plasma cell neoplasm at 58% plasma cells.    Multiple myeloma ([place])   02/08/2015 Bone Marrow Biopsy    The bone marrow exhibits a marked increase in plasma cells (58% by aspirate), consistent with plasma cell myeloma.      03/05/2015 -  Chemotherapy    Revlimid 10 mg days 1-21 every 28 days and 40 mg of Dexamethasone weekly.      03/25/2015 Imaging    Bone survey- Generalized osteopenia with slightly heterogeneous lucency in the shafts of the humeri and femora. No discrete, sizable lytic lesions identified.      03/31/2015 -  Chemotherapy    Zometa 3 mg IV every 2 months        She is doing well.  She still is pleasantly confused.  I am concerned that the patient's daughter reports 2 falls since being seen last.  It is thought that [facility] lowers herself to the ground.  No injuries identified.  Her gait has worsened and I think this is secondary to her dementia.  Weight loss is noted.  This too is likely secondary to her dementia.  Review of Systems  Constitutional: Positive for weight loss. Negative for chills and fever.  HENT: Negative.   Eyes: Negative.   Respiratory: Negative.   Cardiovascular: Negative.   Gastrointestinal: Negative.   Genitourinary: Negative.   Musculoskeletal: Negative.   Skin: Negative.   Neurological: Negative.  Negative for weakness.  Endo/Heme/Allergies: Negative.   Psychiatric/Behavioral: Negative.      Past Medical History:  Diagnosis Date  . Arthritis   . Chronic leg pain   . Dementia   . Elevated total protein 11/10/2014  . High cholesterol   . Hypertension   . Multiple myeloma ([place]) 02/17/2015  . Normocytic anemia 11/10/2014  . Osteopenia 03/31/2015   On Bone scan on 03/25/2015  . Poor historian   . Psychosis   . Schizoaffective disorder   . Thrombocytopenia ([place]) 11/10/2014    Past Surgical History:  Procedure Laterality Date  . COLONOSCOPY  2010   RMR: 1. Normal rectum 2. Sigmoid and cecal polyp, status post snare resection described above. Remainder of the colonic mucosa appeared normal.   . FOOT SURGERY    . TUBAL LIGATION      Family History  Problem Relation Age of Onset  . Diabetes Mother     Social History   Social History  . Marital status: Widowed    Spouse name: N/A  . Number of children: N/A  . Years of education: N/A   Social History Main Topics  . Smoking status: Never Smoker  . Smokeless tobacco: Never Used  . Alcohol use No  . Drug use: No  . Sexual activity: Not Currently    Birth control/ protection: Post-menopausal   Other Topics Concern  . None   Social History Narrative  . None     PHYSICAL EXAMINATION  ECOG PERFORMANCE STATUS: 2 - Symptomatic, <50% confined to bed  Vitals:   09/05/16 1347  BP: 137/62  Pulse: 64  Resp: 18  Temp: 98.3 F (36.8 C)    GENERAL:alert, no distress, comfortable, smiling and pleasantly confused, accompanied by daughter and granddaughter. SKIN: skin color, texture, turgor are normal, no rashes or significant lesions HEAD: Normocephalic, No masses, lesions, tenderness or abnormalities EYES: normal, Conjunctiva are pink and non-injected EARS: External ears normal OROPHARYNX:lips, buccal mucosa, and tongue normal and mucous membranes are moist  NECK: supple, trachea midline LYMPH:  no palpable lymphadenopathy BREAST:not examined LUNGS: clear to auscultation  HEART: regular rate &  rhythm ABDOMEN:abdomen soft and normal bowel sounds BACK: Back symmetric, no curvature. EXTREMITIES:less then 2 second capillary refill, no edema, no clubbing, no cyanosis, positive findings:  Varus malformation of bilateral lower legs.  NEURO: no focal motor/sensory deficits, worsening gait, progressive dementia.   LABORATORY DATA: CBC    Component Value Date/Time   WBC 3.3 (L) 09/05/2016 1306   RBC 3.57 (L) 09/05/2016 1306   HGB 12.2 09/05/2016 1306   HCT 34.5 (L) 09/05/2016 1306   PLT 114 (L) 09/05/2016 1306   MCV 96.6 09/05/2016 1306   MCH 34.2 (H) 09/05/2016 1306   MCHC 35.4 09/05/2016 1306   RDW 14.0 09/05/2016 1306   LYMPHSABS 1.0 09/05/2016 1306   MONOABS 0.3 09/05/2016 1306   EOSABS 0.1 09/05/2016 1306   BASOSABS 0.1 09/05/2016 1306      Chemistry      Component Value Date/Time   NA 136 09/05/2016 1306   K 3.7 09/05/2016 1306   CL 104 09/05/2016 1306   CO2 27 09/05/2016 1306   BUN 19 09/05/2016 1306   CREATININE 0.80 09/05/2016 1306      Component Value Date/Time   CALCIUM 8.5 (L) 09/05/2016 1306   ALKPHOS 61 09/05/2016 1306   AST 19 09/05/2016 1306   ALT 23 09/05/2016 1306   BILITOT 0.6 09/05/2016 1306     Lab Results  Component Value Date   PROT 6.4 (L) 09/05/2016   ALBUMINELP 3.6 07/04/2016   A1GS 0.2 07/04/2016   A2GS 0.7 07/04/2016   BETS 0.9 07/04/2016   BETA2SER 1.0 (L) 01/20/2015   GAMS 1.1 07/04/2016   MSPIKE 0.9 (H) 07/04/2016   SPEI Comment 07/04/2016   SPECOM Comment 07/04/2016   IGGSERUM 1,224 07/04/2016   IGA 159 07/04/2016   IGMSERUM 20 (L) 07/04/2016   IMMELINT (NOTE) 01/20/2015   KPAFRELGTCHN 33.3 (H) 07/04/2016   LAMBDASER 18.0 07/04/2016   KAPLAMBRATIO 1.85 (H) 07/04/2016     PENDING LABS:   RADIOGRAPHIC STUDIES:  No results found.   PATHOLOGY:    ASSESSMENT AND PLAN:  Multiple myeloma ([facility]) IgG multiple myeloma presenting with severe anemia.   BMBX on 02/08/2015 with plasma cell neoplasm at 58% plasma cells.   On RD and 1 mg of Coumadin daily and Zometa every 8 weeks.  Labs today: CBC diff, CMET, SPEP+IFE, light chain assay, IgG, IgM, IgA, and B2M.  I personally reviewed and went over laboratory results with the patient.  The results are noted within this dictation.  Hypocalcemia is noted today and therefore, Zometa will be held.  There is concern for Ca++ being administered at her nursing facility.  I do not have a medication flowsheet available from the nursing home to review.  Order is written for 1200 mg of PO Ca++, 1000 units of PO Vit D.  I have also added Tums BID.  This order is to be provided to the nursing facility.  RD therapy was  chosen due to its tolerance.  She is on 1 mg of Coumadin daily (not in need of INR monitoring) due to increased risk of VTE in the setting of Revlimid therapy.  Order placed for skeletal survey.  I am concerned about her progressive dementia which is contributing to recent falls (fortunately, no injuries) and worsening of her gait.  Over the next few months, we may need to address QOL and change our goals of care.  She has tolerated and responded beautifully to treatment thus far, but her dementia has been her biggest limiting condition.  Return in 8 weeks for follow-up, labs, and Zometa (labs willing).   ORDERS PLACED FOR THIS ENCOUNTER: Orders Placed This Encounter  Procedures  . DG Bone Survey Met  . Kappa/lambda light chains  . IgG, IgA, IgM  . Beta 2 microglobuline, serum  . Immunofixation electrophoresis  . Protein electrophoresis, serum  . CBC with Differential  . Comprehensive metabolic panel    MEDICATIONS PRESCRIBED THIS ENCOUNTER: No orders of the defined types were placed in this encounter.   THERAPY PLAN:  Continue with treatment as outlined.    All questions were answered. The patient knows to call the clinic with any problems, questions or concerns. We can certainly see the patient much sooner if necessary.  Patient and plan  discussed with Dr. Ancil Linsey and she is in agreement with the aforementioned.   This note is electronically signed by: Doy Mince 09/05/2016 5:23 PM

## 2016-09-05 NOTE — ED Triage Notes (Addendum)
Pt had witnessed fall today. Pt fell backwards hitting the back of her head due to unsteady gait. Pt has knot to the back of her head. Pt has hx of dementia and unsteady gait. Pt is on Warfarin. Pt is confused and has unsteady gait in triage, family reports this is pt's normal. Pt denies dizziness but reports nausea, no vomiting. Pt is from [facility] [place].  Pt also c/o left hip pain and left arm pain. Pt able to ambulate in triage.

## 2016-09-05 NOTE — ED Triage Notes (Signed)
Dr. Thurnell Garbe notified of pt's fall while on Coumadin. Order given to get CT scan of head and cervical spine along with PT/INR. Orders entered into Epic.   Pt also c/o left arm and left hip pain. Dr. Thurnell Garbe notified. Dr. Thurnell Garbe would like to examine pt before any further orders are entered.

## 2016-09-05 NOTE — Patient Instructions (Addendum)
[name] at [facility] Discharge Instructions  RECOMMENDATIONS MADE BY THE CONSULTANT AND ANY TEST RESULTS WILL BE SENT TO YOUR REFERRING PHYSICIAN.  You saw Kirby Crigler, PA-C, today. Skeletal Survey today or before her next follow up in 2 months. Follow up with labs in 2 months. Zometa in 2 months. You had the flu shot on 10/19 at the [facility]  Thank you for choosing [place] at [facility] to provide your oncology and hematology care.  To afford each patient quality time with our provider, please arrive at least 15 minutes before your scheduled appointment time.   Beginning January 23rd 2017 lab work for the [facility] will be done in the  Main lab at [facility] on 1st floor. If you have a lab appointment with the [facility] please come in thru the  Main Entrance and check in at the main information desk  You need to re-schedule your appointment should you arrive 10 or more minutes late.  We strive to give you quality time with our providers, and arriving late affects you and other patients whose appointments are after yours.  Also, if you no show three or more times for appointments you may be dismissed from the clinic at the providers discretion.     Again, thank you for choosing [facility].  Our hope is that these requests will decrease the amount of time that you wait before being seen by our physicians.       _____________________________________________________________  Should you have questions after your visit to [facility], please contact our office at [phone] between the hours of 8:30 a.m. and 4:30 p.m.  Voicemails left after 4:30 p.m. will not be returned until the following business day.  For prescription refill requests, have your pharmacy contact our office.         Resources For Cancer Patients and their Caregivers ? American Cancer Society: Can assist with transportation,  wigs, general needs, runs Look Good Feel Better.        949-392-4269 ? Cancer Care: Provides financial assistance, online support groups, medication/co-pay assistance.  1-800-813-HOPE 352-124-6887) ? [facility] Assists [place] cancer patients and their families through emotional , educational and financial support.  703-050-6074 ? [place] DSS Where to apply for food stamps, Medicaid and utility assistance. 320-101-3841 ? [employer]: Transportation to medical appointments. 954-754-8522 ? Social Security Administration: May apply for disability if have a Stage IV cancer. 214-099-8733 365-142-1055 ? [employer], Disability and Transit Services: Assists with nutrition, care and transit needs. [employer] Support Programs: @10RELATIVEDAYS @ > Cancer Support Group  2nd Tuesday of the month 1pm-2pm, Journey Room  > Creative Journey  3rd Tuesday of the month 1130am-1pm, Journey Room  > Look Good Feel Better  1st Wednesday of the month 10am-12 noon, Journey Room (Call [facility] to register [phone])

## 2016-09-05 NOTE — Assessment & Plan Note (Addendum)
IgG multiple myeloma presenting with severe anemia.   BMBX on 02/08/2015 with plasma cell neoplasm at 58% plasma cells.  On RD and 1 mg of Coumadin daily and Zometa every 8 weeks.  Labs today: CBC diff, CMET, SPEP+IFE, light chain assay, IgG, IgM, IgA, and B2M.  I personally reviewed and went over laboratory results with the patient.  The results are noted within this dictation.  Hypocalcemia is noted today and therefore, Zometa will be held.  There is concern for Ca++ being administered at her nursing facility.  I do not have a medication flowsheet available from the nursing home to review.  Order is written for 1200 mg of PO Ca++, 1000 units of PO Vit D.  I have also added Tums BID.  This order is to be provided to the nursing facility.  RD therapy was chosen due to its tolerance.  She is on 1 mg of Coumadin daily (not in need of INR monitoring) due to increased risk of VTE in the setting of Revlimid therapy.  Order placed for skeletal survey.  I am concerned about her progressive dementia which is contributing to recent falls (fortunately, no injuries) and worsening of her gait.  Over the next few months, we may need to address QOL and change our goals of care.  She has tolerated and responded beautifully to treatment thus far, but her dementia has been her biggest limiting condition.  Return in 8 weeks for follow-up, labs, and Zometa (labs willing).

## 2016-09-05 NOTE — ED Notes (Signed)
Pt wheeled from ED in wheelchair to the car by RN.

## 2016-09-05 NOTE — Discharge Instructions (Signed)
Take over the counter tylenol, as directed on packaging, as needed for discomfort. Apply moist heat or ice to the area(s) of discomfort, for 15 minutes at a time, several times per day for the next few days.  Do not fall asleep on a heating or ice pack.  Call your regular medical doctor tomorrow to schedule a follow up appointment in the next 2 days.  Return to the Emergency Department immediately if worsening. ° °

## 2016-09-05 NOTE — Progress Notes (Signed)
Labs reviewed with Kirby Crigler PA-C. No xometa today per protocol.

## 2016-09-05 NOTE — ED Provider Notes (Signed)
[facility] DEPT Provider Note   CSN: [id number] Arrival date & time: 09/05/16  1728     History   Chief Complaint Chief Complaint  Patient presents with  . Fall    HPI [name] is a 82 y.o. female.  The history is provided by the patient, a relative and a caregiver. The history is limited by the condition of the patient (Hx dementia).  Fall   Pt was seen at 1900. Per pt and her family: Family states pt tripped and fell backwards, landing on her left side. Family states pt hit her head and lower back/hip area. Pt was able to stand with assistance after the fall. Pt ambulated to their car PTA with her usual baseline gait (unsteady). Pt has significant hx of dementia.   Past Medical History:  Diagnosis Date  . Arthritis   . Chronic leg pain   . Dementia   . Elevated total protein 11/10/2014  . High cholesterol   . Hypertension   . Multiple myeloma (HCC) 02/17/2015  . Normocytic anemia 11/10/2014  . Osteopenia 03/31/2015   On Bone scan on 03/25/2015  . Poor historian   . Psychosis   . Schizoaffective disorder   . Thrombocytopenia (HCC) 11/10/2014    Patient Active Problem List   Diagnosis Date Noted  . Fever 01/20/2016  . Influenza A 01/20/2016  . Dehydration 01/20/2016  . CKD (chronic kidney disease) stage 3, GFR 30-59 ml/min 04/14/2015  . Hyperglycemia 04/14/2015  . Generalized weakness 04/14/2015  . Dementia   . Osteopenia 03/31/2015  . Multiple myeloma (HCC) 02/17/2015  . Constipation 12/08/2014  . Normocytic anemia 11/10/2014  . Thrombocytopenia (HCC) 11/10/2014  . Elevated total protein 11/10/2014  . Anemia 11/09/2014  . Vaginal bleeding 07/22/2014  . Schizoaffective disorder, unspecified type (HCC) 07/22/2014  . Vagina bleeding 07/22/2014  . Dementia without behavioral disturbance 07/16/2014  . Altered mental status 07/16/2014  . Essential hypertension 07/16/2014  . Routine gynecological examination 07/16/2014  . Cervicitis 07/16/2014  .  GERD 08/25/2009  . OTHER DYSPHAGIA 08/25/2009  . CERVICAL MUSCLE STRAIN 02/11/2009  . COLONIC POLYPS, ADENOMATOUS, HX OF 02/11/2009  . HEMORRHOIDS, INTERNAL 02/10/2009    Past Surgical History:  Procedure Laterality Date  . COLONOSCOPY  2010   RMR: 1. Normal rectum 2. Sigmoid and cecal polyp, status post snare resection described above. Remainder of the colonic mucosa appeared normal.   . FOOT SURGERY    . TUBAL LIGATION         Home Medications    Prior to Admission medications   Medication Sig Start Date End Date Taking? Authorizing Provider  acetaminophen (TYLENOL) 325 MG tablet Take 650 mg by mouth every 4 (four) hours as needed for fever.    Historical Provider, MD  amLODipine (NORVASC) 2.5 MG tablet Take 2.5 mg by mouth daily.    Historical Provider, MD  calcium carbonate (TUMS - DOSED IN MG ELEMENTAL CALCIUM) 500 MG chewable tablet Chew 2 tablets by mouth daily.    Historical Provider, MD  calcium citrate-vitamin D (CITRACAL+D) 315-200 MG-UNIT tablet Take 1 tablet by mouth 2 (two) times daily.    Historical Provider, MD  cholecalciferol (VITAMIN D) 1000 units tablet Take 500 Units by mouth daily.    Historical Provider, MD  clonazePAM (KLONOPIN) 0.25 MG disintegrating tablet Take 0.25 mg by mouth at bedtime.    Historical Provider, MD  dexamethasone (DECADRON) 4 MG tablet Take 10 tablets (40 mg total) by mouth once a week. Patient taking differently:  Take 40 mg by mouth once a week. Receives every Thursday. 04/15/15   Radene Gunning, NP  fentaNYL (DURAGESIC - DOSED MCG/HR) 12 MCG/HR Place 1 patch (12.5 mcg total) onto the skin every 3 (three) days. Patient taking differently: Place 12 mcg onto the skin every 3 (three) days.  04/15/15   Radene Gunning, NP  ferrous sulfate 325 (65 FE) MG tablet Take 1 tablet (325 mg total) by mouth 3 (three) times daily with meals. Patient taking differently: Take 325 mg by mouth 4 (four) times daily.  07/24/14   Barton Dubois, MD  fluticasone  [facility]) 50 MCG/ACT nasal spray Place 2 sprays into both nostrils daily.    Historical Provider, MD  HYDROcodone-acetaminophen (NORCO/VICODIN) 5-325 MG per tablet Take 1 tablet by mouth at bedtime as needed (pain). Patient taking differently: Take 1 tablet by mouth every 12 (twelve) hours as needed (pain).  04/15/15   Radene Gunning, NP  lenalidomide (REVLIMID) 10 MG capsule Take 1 capsule (10 mg total) by mouth daily. 08/21/16   Patrici Ranks, MD  loperamide (IMODIUM) 2 MG capsule Take 2 mg by mouth as needed for diarrhea or loose stools.    Historical Provider, MD  loratadine (CLARITIN) 10 MG tablet Take 1 tablet (10 mg total) by mouth daily. 07/04/16   Patrici Ranks, MD  Melatonin 3 MG CAPS Take 3 mg by mouth at bedtime.     Historical Provider, MD  Multiple Vitamins-Minerals (MULTIVITAMIN WITH MINERALS) tablet Take 1 tablet by mouth daily.    Historical Provider, MD  polyethylene glycol (MIRALAX / GLYCOLAX) packet Take 17 g by mouth at bedtime.     Historical Provider, MD  potassium chloride SA (K-DUR,KLOR-CON) 20 MEQ tablet Take 1 tablet (20 mEq total) by mouth 2 (two) times daily. 11/10/15   Baird Cancer, PA-C  senna (SENOKOT) 8.6 MG TABS tablet Take 1 tablet by mouth 2 (two) times daily.    Historical Provider, MD  warfarin (COUMADIN) 1 MG tablet Take 1 tablet (1 mg total) by mouth daily. 02/17/15   Baird Cancer, PA-C  Wheat Dextrin (BENEFIBER DRINK MIX) PACK Take by mouth. Take 2 teaspoons by mouth everyday.    Historical Provider, MD    Family History Family History  Problem Relation Age of Onset  . Diabetes Mother     Social History Social History  Substance Use Topics  . Smoking status: Never Smoker  . Smokeless tobacco: Never Used  . Alcohol use No     Allergies   Pollen extract   Review of Systems Review of Systems  Unable to perform ROS: Dementia     Physical Exam Updated Vital Signs BP 126/58   Pulse 74   Temp 98.9 F (37.2 C) (Oral)   Resp 15    Ht '5\' 2"'$  (1.575 m)   Wt 118 lb 11.2 oz (53.8 kg)   SpO2 100%   BMI 21.71 kg/m   Physical Exam 1905: Physical examination: Vital signs and O2 SAT: Reviewed; Constitutional: Well developed, Well nourished, Well hydrated, In no acute distress; Head and Face: Normocephalic, +contusion left posterior scalp. No open wounds.; Eyes: EOMI, PERRL, No scleral icterus; ENMT: Mouth and pharynx normal, Mucous membranes moist; Neck: Supple, Trachea midline; Spine: +TTP left lumbar paraspinal muscles. No midline CS, TS, LS tenderness.; Cardiovascular: Regular rate and rhythm, No gallop; Respiratory: Breath sounds clear & equal bilaterally, No wheezes, Normal respiratory effort/excursion; Chest: Nontender, No deformity, Movement normal, No crepitus, No abrasions or ecchymosis.;  Abdomen: Soft, Nontender, Nondistended, Normal bowel sounds, No abrasions or ecchymosis.; Genitourinary: No CVA tenderness;; Extremities: No deformity, Full range of motion major/large joints of bilat UE's and LE's without pain or tenderness to palp, Neurovascularly intact, Pulses normal, No tenderness, No edema, Pelvis stable; Neuro: Awake, alert, confused per hx dementia.  Major CN grossly intact. No facial droop. Speech clear. Moves all extremities on stretcher and to command without apparent gross focal motor deficits..; Skin: Color normal, Warm, Dry   ED Treatments / Results  Labs (all labs ordered are listed, but only abnormal results are displayed)   EKG  EKG Interpretation None       Radiology   Procedures Procedures (including critical care time)  Medications Ordered in ED Medications - No data to display   Initial Impression / Assessment and Plan / ED Course  I have reviewed the triage vital signs and the nursing notes.  Pertinent labs & imaging results that were available during my care of the patient were reviewed by me and considered in my medical decision making (see chart for details).  MDM Reviewed:  previous chart, nursing note and vitals Reviewed previous: labs Interpretation: labs, x-ray and CT scan   Results for orders placed or performed during the hospital encounter of 09/05/16  Protime-INR  Result Value Ref Range   Prothrombin Time 14.2 11.4 - 15.2 seconds   INR 1.10    Dg Chest 2 View Result Date: 09/05/2016 CLINICAL DATA:  Status post fall today. EXAM: CHEST  2 VIEW COMPARISON:  Single-view of the chest 01/20/2016 and PA and lateral chest 04/15/2015. FINDINGS: Lungs are clear. Heart size is enlarged. Aortic atherosclerosis is noted. No pneumothorax or pleural effusion. No fracture is identified. Severe degenerative disease right shoulder noted. IMPRESSION: No acute disease. Cardiomegaly. Atherosclerosis. Electronically Signed   By: Inge Rise M.D.   On: 09/05/2016 20:02   Dg Lumbar Spine Complete Result Date: 09/05/2016 CLINICAL DATA:  82 year old female with history of witnessed fall today complaining of left-sided low back pain. EXAM: LUMBAR SPINE - COMPLETE 4+ VIEW COMPARISON:  No priors. FINDINGS: Five views of the lumbar spine demonstrate no acute displaced fracture or definite compression type fracture. There is no definite defect of the pars interarticularis. Multilevel degenerative disc disease, most severe T12-L1, L1-L2, L2-L3 and L5-S1. Multilevel facet arthropathy, most severe at L4-L5 and L5-S1. IMPRESSION: 1. No acute radiographic abnormality of the lumbar spine. 2. Multilevel degenerative disc disease and lumbar spondylosis, as above. Electronically Signed   By: Vinnie Langton M.D.   On: 09/05/2016 20:05   Ct Head Wo Contrast Result Date: 09/05/2016 CLINICAL DATA:  Status post fall today with a blow to the back of the head. Unsteady today. The patient is anticoagulated. EXAM: CT HEAD WITHOUT CONTRAST CT CERVICAL SPINE WITHOUT CONTRAST TECHNIQUE: Multidetector CT imaging of the head and cervical spine was performed following the standard protocol without  intravenous contrast. Multiplanar CT image reconstructions of the cervical spine were also generated. COMPARISON:  Head and cervical spine CT scans 05/27/2012. Head CT scan 06/19/2010 and 02/25/2012. FINDINGS: CT HEAD FINDINGS Brain: Cortical atrophy and chronic microvascular ischemic change are identified. No evidence of acute intracranial abnormality including hemorrhage, infarct, mass lesion, mass effect, midline shift or abnormal extra-axial fluid collection is identified. No hydrocephalus or pneumocephalus. Vascular: Extensive atherosclerotic vascular disease noted. Skull: The calvarium has a moth-eaten. Calvarial lucencies are much more conspicuous than on the prior examinations. Sinuses/Orbits: Unremarkable. Other: None. CT CERVICAL SPINE FINDINGS Alignment: Maintained. Skull base  and vertebrae: No fracture is identified. No definite lytic or sclerotic lesion. Soft tissues and spinal canal: No prevertebral fluid or swelling. No visible canal hematoma. Disc levels:  Loss disc space height is seen at C3-4 and C5-6. Upper chest: Clear. Other: None. IMPRESSION: No acute abnormality head or cervical spine. Atrophy and chronic microvascular ischemic change. Moth-eaten appearance of the calvarium worrisome for multiple myeloma or possibly progressive osteopenia. The appearance is markedly worsened compared to the prior exams. Electronically Signed   By: Inge Rise M.D.   On: 09/05/2016 19:24   Ct Cervical Spine Wo Contrast Result Date: 09/05/2016 CLINICAL DATA:  Status post fall today with a blow to the back of the head. Unsteady today. The patient is anticoagulated. EXAM: CT HEAD WITHOUT CONTRAST CT CERVICAL SPINE WITHOUT CONTRAST TECHNIQUE: Multidetector CT imaging of the head and cervical spine was performed following the standard protocol without intravenous contrast. Multiplanar CT image reconstructions of the cervical spine were also generated. COMPARISON:  Head and cervical spine CT scans  05/27/2012. Head CT scan 06/19/2010 and 02/25/2012. FINDINGS: CT HEAD FINDINGS Brain: Cortical atrophy and chronic microvascular ischemic change are identified. No evidence of acute intracranial abnormality including hemorrhage, infarct, mass lesion, mass effect, midline shift or abnormal extra-axial fluid collection is identified. No hydrocephalus or pneumocephalus. Vascular: Extensive atherosclerotic vascular disease noted. Skull: The calvarium has a moth-eaten. Calvarial lucencies are much more conspicuous than on the prior examinations. Sinuses/Orbits: Unremarkable. Other: None. CT CERVICAL SPINE FINDINGS Alignment: Maintained. Skull base and vertebrae: No fracture is identified. No definite lytic or sclerotic lesion. Soft tissues and spinal canal: No prevertebral fluid or swelling. No visible canal hematoma. Disc levels:  Loss disc space height is seen at C3-4 and C5-6. Upper chest: Clear. Other: None. IMPRESSION: No acute abnormality head or cervical spine. Atrophy and chronic microvascular ischemic change. Moth-eaten appearance of the calvarium worrisome for multiple myeloma or possibly progressive osteopenia. The appearance is markedly worsened compared to the prior exams. Electronically Signed   By: Inge Rise M.D.   On: 09/05/2016 19:24   Dg Hip Unilat With Pelvis 2-3 Views Left Result Date: 09/05/2016 CLINICAL DATA:  Left hip pain since a fall earlier today. Initial encounter. EXAM: DG HIP (WITH OR WITHOUT PELVIS) 2-3V LEFT COMPARISON:  Osseous survey 03/25/2015. FINDINGS: No acute bony or joint abnormality is identified. Advanced osteoarthritis right hip is noted. Bones are osteopenic. IMPRESSION: No acute abnormality. Advanced osteoarthritis right hip. Osteopenia. Electronically Signed   By: Inge Rise M.D.   On: 09/05/2016 20:01    2040:  Pt has ambulated in the ED with her baseline gait. APAP given for discomfort. Family made aware of progressive CT-H findings. Tx symptomatically at  this time. Dx and testing d/w pt and family.  Questions answered.  Verb understanding, agreeable to d/c home with outpt f/u.      Final Clinical Impressions(s) / ED Diagnoses   Final diagnoses:  Fall    New Prescriptions New Prescriptions   No medications on file     Francine Graven, DO 09/09/16 8811

## 2016-09-06 LAB — PROTEIN ELECTROPHORESIS, SERUM
A/G RATIO SPE: 1.2 (ref 0.7–1.7)
ALPHA-2-GLOBULIN: 0.7 g/dL (ref 0.4–1.0)
Albumin ELP: 3.5 g/dL (ref 2.9–4.4)
Alpha-1-Globulin: 0.3 g/dL (ref 0.0–0.4)
BETA GLOBULIN: 0.9 g/dL (ref 0.7–1.3)
GAMMA GLOBULIN: 1 g/dL (ref 0.4–1.8)
Globulin, Total: 2.9 g/dL (ref 2.2–3.9)
M-SPIKE, %: 0.9 g/dL — AB
Total Protein ELP: 6.4 g/dL (ref 6.0–8.5)

## 2016-09-06 LAB — KAPPA/LAMBDA LIGHT CHAINS
KAPPA, LAMDA LIGHT CHAIN RATIO: 1.58 (ref 0.26–1.65)
Kappa free light chain: 24.4 mg/L — ABNORMAL HIGH (ref 3.3–19.4)
LAMDA FREE LIGHT CHAINS: 15.4 mg/L (ref 5.7–26.3)

## 2016-09-06 LAB — IGG, IGA, IGM
IGA: 153 mg/dL (ref 64–422)
IGG (IMMUNOGLOBIN G), SERUM: 1067 mg/dL (ref 700–1600)
IGM, SERUM: 26 mg/dL (ref 26–217)

## 2016-09-06 LAB — BETA 2 MICROGLOBULIN, SERUM: BETA 2 MICROGLOBULIN: 2.6 mg/L — AB (ref 0.6–2.4)

## 2016-09-07 LAB — IMMUNOFIXATION ELECTROPHORESIS
IGA: 149 mg/dL (ref 64–422)
IGG (IMMUNOGLOBIN G), SERUM: 1023 mg/dL (ref 700–1600)
IgM, Serum: 28 mg/dL (ref 26–217)
Total Protein ELP: 6.2 g/dL (ref 6.0–8.5)

## 2016-09-21 ENCOUNTER — Other Ambulatory Visit (HOSPITAL_COMMUNITY): Payer: Self-pay | Admitting: Oncology

## 2016-09-21 DIAGNOSIS — C9000 Multiple myeloma not having achieved remission: Secondary | ICD-10-CM

## 2016-09-21 MED ORDER — LENALIDOMIDE 10 MG PO CAPS: 10.0000 mg | ORAL_CAPSULE | Freq: Every day | ORAL | 0 refills | Status: DC

## 2016-10-19 ENCOUNTER — Other Ambulatory Visit (HOSPITAL_COMMUNITY): Payer: Self-pay | Admitting: Oncology

## 2016-10-19 DIAGNOSIS — C9000 Multiple myeloma not having achieved remission: Secondary | ICD-10-CM

## 2016-10-20 ENCOUNTER — Emergency Department (HOSPITAL_COMMUNITY)
Admission: EM | Admit: 2016-10-20 | Discharge: 2016-10-20 | Disposition: A | Discharge status: Home / Self Care | Payer: Medicare Other | Attending: Emergency Medicine | Admitting: Emergency Medicine

## 2016-10-20 ENCOUNTER — Encounter (HOSPITAL_COMMUNITY): Payer: Self-pay | Admitting: Emergency Medicine

## 2016-10-20 DIAGNOSIS — N183 Chronic kidney disease, stage 3 (moderate): Secondary | ICD-10-CM | POA: Diagnosis not present

## 2016-10-20 DIAGNOSIS — Y939 Activity, unspecified: Secondary | ICD-10-CM | POA: Diagnosis not present

## 2016-10-20 DIAGNOSIS — I129 Hypertensive chronic kidney disease with stage 1 through stage 4 chronic kidney disease, or unspecified chronic kidney disease: Secondary | ICD-10-CM | POA: Insufficient documentation

## 2016-10-20 DIAGNOSIS — Y92129 Unspecified place in nursing home as the place of occurrence of the external cause: Secondary | ICD-10-CM | POA: Insufficient documentation

## 2016-10-20 DIAGNOSIS — S0181XA Laceration without foreign body of other part of head, initial encounter: Secondary | ICD-10-CM

## 2016-10-20 DIAGNOSIS — W1800XA Striking against unspecified object with subsequent fall, initial encounter: Secondary | ICD-10-CM | POA: Diagnosis not present

## 2016-10-20 DIAGNOSIS — Z7901 Long term (current) use of anticoagulants: Secondary | ICD-10-CM | POA: Diagnosis not present

## 2016-10-20 DIAGNOSIS — S01112A Laceration without foreign body of left eyelid and periocular area, initial encounter: Secondary | ICD-10-CM | POA: Insufficient documentation

## 2016-10-20 DIAGNOSIS — Y999 Unspecified external cause status: Secondary | ICD-10-CM | POA: Diagnosis not present

## 2016-10-20 DIAGNOSIS — S0990XA Unspecified injury of head, initial encounter: Secondary | ICD-10-CM | POA: Diagnosis present

## 2016-10-20 HISTORY — DX: Dysphagia, oropharyngeal phase: R13.12

## 2016-10-20 HISTORY — DX: Major depressive disorder, single episode, unspecified: F32.9

## 2016-10-20 NOTE — ED Notes (Signed)
RN gave d/c report to Opal Sidles at [facility] [place]. [facility] will provide d/c transportation.

## 2016-10-20 NOTE — ED Provider Notes (Signed)
[facility] DEPT Provider Note   CSN: [id number] Arrival date & time: 10/20/16  1336     History   Chief Complaint Chief Complaint  Patient presents with  . Head Laceration    Level V caveat: Dementia  HPI [name] is a 82 y.o. female.  HPI Patient is brought to the emergency department from a nursing home after a witnessed fall.  She struck her left side of her head.  She presents with a small laceration of her left periorbital rim without active bleeding.  She is on Coumadin.  She is a DO NOT RESUSCITATE.  Family reports that she would not be a candidate for neurosurgical services if she was found to have a head bleed.  No confusion or altered mental status per family.  Family reports she's been in her normal state of health over the past several days.  Family is requesting repair of laceration.   Past Medical History:  Diagnosis Date  . Arthritis   . Chronic leg pain   . Dementia   . Dysphagia, oropharyngeal phase   . Elevated total protein 11/10/2014  . High cholesterol   . Hypertension   . Major depressive disorder   . Multiple myeloma ([place]) 02/17/2015  . Normocytic anemia 11/10/2014  . Osteopenia 03/31/2015   On Bone scan on 03/25/2015  . Poor historian   . Psychosis   . Schizoaffective disorder   . Thrombocytopenia ([place]) 11/10/2014    Patient Active Problem List   Diagnosis Date Noted  . Fever 01/20/2016  . Influenza A 01/20/2016  . Dehydration 01/20/2016  . CKD (chronic kidney disease) stage 3, GFR 30-59 ml/min 04/14/2015  . Hyperglycemia 04/14/2015  . Generalized weakness 04/14/2015  . Dementia   . Osteopenia 03/31/2015  . Multiple myeloma ([place]) 02/17/2015  . Constipation 12/08/2014  . Normocytic anemia 11/10/2014  . Thrombocytopenia ([place]) 11/10/2014  . Elevated total protein 11/10/2014  . Anemia 11/09/2014  . Vaginal bleeding 07/22/2014  . Schizoaffective disorder, unspecified type ([place]) 07/22/2014  . Vagina bleeding 07/22/2014  .  Dementia without behavioral disturbance 07/16/2014  . Altered mental status 07/16/2014  . Essential hypertension 07/16/2014  . Routine gynecological examination 07/16/2014  . Cervicitis 07/16/2014  . GERD 08/25/2009  . OTHER DYSPHAGIA 08/25/2009  . CERVICAL MUSCLE STRAIN 02/11/2009  . COLONIC POLYPS, ADENOMATOUS, HX OF 02/11/2009  . HEMORRHOIDS, INTERNAL 02/10/2009    Past Surgical History:  Procedure Laterality Date  . COLONOSCOPY  2010   RMR: 1. Normal rectum 2. Sigmoid and cecal polyp, status post snare resection described above. Remainder of the colonic mucosa appeared normal.   . FOOT SURGERY    . TUBAL LIGATION      OB History    No data available       Home Medications    Prior to Admission medications   Medication Sig Start Date End Date Taking? Authorizing Provider  acetaminophen (TYLENOL) 325 MG tablet Take 650 mg by mouth every 4 (four) hours as needed for fever.    Historical Provider, MD  amLODipine (NORVASC) 2.5 MG tablet Take 2.5 mg by mouth daily.    Historical Provider, MD  calcium carbonate (TUMS - DOSED IN MG ELEMENTAL CALCIUM) 500 MG chewable tablet Chew 2 tablets by mouth daily.    Historical Provider, MD  calcium citrate-vitamin D (CITRACAL+D) 315-200 MG-UNIT tablet Take 1 tablet by mouth 2 (two) times daily.    Historical Provider, MD  cholecalciferol (VITAMIN D) 1000 units tablet Take 500 Units by mouth  daily.    Historical Provider, MD  clonazePAM (KLONOPIN) 0.25 MG disintegrating tablet Take 0.25 mg by mouth at bedtime.    Historical Provider, MD  dexamethasone (DECADRON) 4 MG tablet Take 10 tablets (40 mg total) by mouth once a week. Patient taking differently: Take 40 mg by mouth once a week. Receives every Thursday. 04/15/15   Radene Gunning, NP  fentaNYL (DURAGESIC - DOSED MCG/HR) 12 MCG/HR Place 1 patch (12.5 mcg total) onto the skin every 3 (three) days. Patient taking differently: Place 12 mcg onto the skin every 3 (three) days.  04/15/15   Radene Gunning, NP  ferrous sulfate 325 (65 FE) MG tablet Take 1 tablet (325 mg total) by mouth 3 (three) times daily with meals. Patient taking differently: Take 325 mg by mouth 4 (four) times daily.  07/24/14   Barton Dubois, MD  fluticasone [facility]) 50 MCG/ACT nasal spray Place 2 sprays into both nostrils daily.    Historical Provider, MD  HYDROcodone-acetaminophen (NORCO/VICODIN) 5-325 MG per tablet Take 1 tablet by mouth at bedtime as needed (pain). Patient taking differently: Take 1 tablet by mouth every 12 (twelve) hours as needed (pain).  04/15/15   Radene Gunning, NP  loperamide (IMODIUM) 2 MG capsule Take 2 mg by mouth as needed for diarrhea or loose stools.    Historical Provider, MD  loratadine (CLARITIN) 10 MG tablet Take 1 tablet (10 mg total) by mouth daily. 07/04/16   Patrici Ranks, MD  Melatonin 3 MG CAPS Take 3 mg by mouth at bedtime.     Historical Provider, MD  Multiple Vitamins-Minerals (MULTIVITAMIN WITH MINERALS) tablet Take 1 tablet by mouth daily.    Historical Provider, MD  polyethylene glycol (MIRALAX / GLYCOLAX) packet Take 17 g by mouth at bedtime.     Historical Provider, MD  potassium chloride SA (K-DUR,KLOR-CON) 20 MEQ tablet Take 1 tablet (20 mEq total) by mouth 2 (two) times daily. 11/10/15   Baird Cancer, PA-C  REVLIMID 10 MG capsule TAKE 1 CAPSULE BY MOUTH DAILY FOR 21 DAYS ON FOLLOWED BY 7 DAYS OFF. 10/19/16   Baird Cancer, PA-C  senna (SENOKOT) 8.6 MG TABS tablet Take 1 tablet by mouth 2 (two) times daily.    Historical Provider, MD  warfarin (COUMADIN) 1 MG tablet Take 1 tablet (1 mg total) by mouth daily. 02/17/15   Baird Cancer, PA-C  Wheat Dextrin (BENEFIBER DRINK MIX) PACK Take by mouth. Take 2 teaspoons by mouth everyday.    Historical Provider, MD    Family History Family History  Problem Relation Age of Onset  . Diabetes Mother     Social History Social History  Substance Use Topics  . Smoking status: Never Smoker  . Smokeless tobacco: Never  Used  . Alcohol use No     Allergies   Pollen extract   Review of Systems Review of Systems  All other systems reviewed and are negative.    Physical Exam Updated Vital Signs BP 126/73 (BP Location: Left Arm)   Pulse 66   Temp 97.7 F (36.5 C) (Oral)   Resp 18   Ht '5\' 5"'$  (1.651 m)   Wt 118 lb (53.5 kg)   SpO2 100%   BMI 19.64 kg/m   Physical Exam  Constitutional: She appears well-developed and well-nourished. No distress.  HENT:  Head: Normocephalic.  Superficial laceration of the left periorbital region without active bleeding.  Eyes: EOM are normal.  Neck: Normal range of motion.  Cardiovascular: Normal rate, regular rhythm and normal heart sounds.   Pulmonary/Chest: Effort normal and breath sounds normal.  Abdominal: Soft. She exhibits no distension. There is no tenderness.  Musculoskeletal: Normal range of motion.  Neurological: She is alert.  Moves all 4 some is equally  Skin: Skin is warm and dry.  Psychiatric: She has a normal mood and affect. Judgment normal.  Nursing note and vitals reviewed.    ED Treatments / Results  Labs (all labs ordered are listed, but only abnormal results are displayed) Labs Reviewed - No data to display  EKG  EKG Interpretation None       Radiology No results found.  Procedures Procedures (including critical care time)  ++++++++++++++++++++++++++++++++++++++++++++++++++++++++++++  LACERATION REPAIR Performed by: Hoy Morn Consent: Verbal consent obtained. Risks and benefits: risks, benefits and alternatives were discussed Patient identity confirmed: provided demographic data Time out performed prior to procedure Prepped and Draped in normal sterile fashion Wound explored Laceration Location: Left lateral periorbital Rim Laceration Length: 1.5cm No Foreign Bodies seen or palpated Anesthesia: none Amount of cleaning: standard Skin closure: Dermabond  Number of sutures or staples: Dermabond    Technique: Dermabond  Patient tolerance: Patient tolerated the procedure well with no immediate complications.    Medications Ordered in ED Medications - No data to display   Initial Impression / Assessment and Plan / ED Course  I have reviewed the triage vital signs and the nursing notes.  Pertinent labs & imaging results that were available during my care of the patient were reviewed by me and considered in my medical decision making (see chart for details).  Clinical Course     Laceration repair with Dermabond.  No indication for CT imaging given her advanced age and dementia.  Family understands and other ones that are agreeable.  Final Clinical Impressions(s) / ED Diagnoses   Final diagnoses:  Facial laceration, initial encounter    New Prescriptions New Prescriptions   No medications on file     Jola Schmidt, MD 10/20/16 1443

## 2016-10-20 NOTE — ED Notes (Signed)
Pt's family is sitting with her.

## 2016-10-20 NOTE — ED Triage Notes (Signed)
Per EMS pt is from [facility] and fell today.  EMS states that the CNA across the hall stated that she witnessed the fall, but could not tell if she stumbled or became dizzy.  Pt hit her head and has a laceration over left eye.  Per EMS pt did not lose consciousness and has been alert.  Pt was eating and drinking when they arrived.  Pt vital sign are WNL.  CBG 106.

## 2016-10-20 NOTE — ED Notes (Signed)
Placed a bandage on pt eye to cover laceration.

## 2016-10-31 ENCOUNTER — Encounter (HOSPITAL_COMMUNITY): Payer: Medicare Other

## 2016-10-31 ENCOUNTER — Encounter (HOSPITAL_BASED_OUTPATIENT_CLINIC_OR_DEPARTMENT_OTHER): Payer: Medicare Other

## 2016-10-31 ENCOUNTER — Encounter (HOSPITAL_COMMUNITY): Payer: Medicare Other | Attending: Oncology | Admitting: Oncology

## 2016-10-31 ENCOUNTER — Encounter (HOSPITAL_COMMUNITY): Payer: Self-pay | Admitting: Oncology

## 2016-10-31 DIAGNOSIS — E7800 Pure hypercholesterolemia, unspecified: Secondary | ICD-10-CM | POA: Insufficient documentation

## 2016-10-31 DIAGNOSIS — C9001 Multiple myeloma in remission: Secondary | ICD-10-CM

## 2016-10-31 DIAGNOSIS — Z833 Family history of diabetes mellitus: Secondary | ICD-10-CM | POA: Diagnosis not present

## 2016-10-31 DIAGNOSIS — F0390 Unspecified dementia without behavioral disturbance: Secondary | ICD-10-CM | POA: Insufficient documentation

## 2016-10-31 DIAGNOSIS — I10 Essential (primary) hypertension: Secondary | ICD-10-CM | POA: Insufficient documentation

## 2016-10-31 DIAGNOSIS — R296 Repeated falls: Secondary | ICD-10-CM | POA: Diagnosis not present

## 2016-10-31 DIAGNOSIS — Z9221 Personal history of antineoplastic chemotherapy: Secondary | ICD-10-CM | POA: Diagnosis not present

## 2016-10-31 DIAGNOSIS — D649 Anemia, unspecified: Secondary | ICD-10-CM | POA: Diagnosis not present

## 2016-10-31 DIAGNOSIS — R634 Abnormal weight loss: Secondary | ICD-10-CM | POA: Diagnosis not present

## 2016-10-31 DIAGNOSIS — E876 Hypokalemia: Secondary | ICD-10-CM | POA: Diagnosis not present

## 2016-10-31 DIAGNOSIS — Z9889 Other specified postprocedural states: Secondary | ICD-10-CM | POA: Diagnosis not present

## 2016-10-31 DIAGNOSIS — M8580 Other specified disorders of bone density and structure, unspecified site: Secondary | ICD-10-CM | POA: Insufficient documentation

## 2016-10-31 LAB — COMPREHENSIVE METABOLIC PANEL
ALK PHOS: 57 U/L (ref 38–126)
ALT: 18 U/L (ref 14–54)
AST: 19 U/L (ref 15–41)
Albumin: 3.9 g/dL (ref 3.5–5.0)
Anion gap: 7 (ref 5–15)
BUN: 16 mg/dL (ref 6–20)
CALCIUM: 9.4 mg/dL (ref 8.9–10.3)
CHLORIDE: 100 mmol/L — AB (ref 101–111)
CO2: 31 mmol/L (ref 22–32)
CREATININE: 0.85 mg/dL (ref 0.44–1.00)
GFR calc Af Amer: >60 mL/min (ref >60)
Glucose, Bld: 92 mg/dL (ref 65–99)
Potassium: 3.4 mmol/L — ABNORMAL LOW (ref 3.5–5.1)
Sodium: 138 mmol/L (ref 135–145)
Total Bilirubin: 0.7 mg/dL (ref 0.3–1.2)
Total Protein: 6.8 g/dL (ref 6.5–8.1)

## 2016-10-31 LAB — CBC WITH DIFFERENTIAL/PLATELET
BASOS ABS: 0 10*3/uL (ref 0.0–0.1)
Basophils Relative: 1 %
Eosinophils Absolute: 0.1 10*3/uL (ref 0.0–0.7)
Eosinophils Relative: 2 %
HEMATOCRIT: 38.6 % (ref 36.0–46.0)
HEMOGLOBIN: 13.4 g/dL (ref 12.0–15.0)
LYMPHS ABS: 1.1 10*3/uL (ref 0.7–4.0)
LYMPHS PCT: 19 %
MCH: 34.3 pg — AB (ref 26.0–34.0)
MCHC: 34.7 g/dL (ref 30.0–36.0)
MCV: 98.7 fL (ref 78.0–100.0)
Monocytes Absolute: 0.3 10*3/uL (ref 0.1–1.0)
Monocytes Relative: 5 %
NEUTROS ABS: 4.2 10*3/uL (ref 1.7–7.7)
NEUTROS PCT: 73 %
PLATELETS: 129 10*3/uL — AB (ref 150–400)
RBC: 3.91 MIL/uL (ref 3.87–5.11)
RDW: 14.2 % (ref 11.5–15.5)
WBC: 5.7 10*3/uL (ref 4.0–10.5)

## 2016-10-31 MED ORDER — POTASSIUM CHLORIDE CRYS ER 20 MEQ PO TBCR: 20.0000 meq | EXTENDED_RELEASE_TABLET | Freq: Every day | ORAL | 0 refills | Status: AC

## 2016-10-31 MED ORDER — ZOLEDRONIC ACID 4 MG/5ML IV CONC
3.0000 mg | Freq: Once | INTRAVENOUS | Status: AC
  Administered 2016-10-31: 3 mg via INTRAVENOUS
  Filled 2016-10-31: qty 3.75

## 2016-10-31 MED ORDER — SODIUM CHLORIDE 0.9 % IV SOLN
Freq: Once | INTRAVENOUS | Status: AC
  Administered 2016-10-31: 13:00:00 via INTRAVENOUS

## 2016-10-31 NOTE — Assessment & Plan Note (Addendum)
IgG multiple myeloma presenting with severe anemia.   BMBX on 02/08/2015 with plasma cell neoplasm at 58% plasma cells.  On RD and 1 mg of Coumadin daily and Zometa every 8 weeks.  Oncology history is updated.  Labs today: CBC diff, CMET, SPEP+IFE, light chain assay, IgG, IgM, IgA, and B2M.  I personally reviewed and went over laboratory results with the patient.  The results are noted within this dictation.  Calcium is WNL.  Mild hypokalemia noted.  Kdur is escribed.  Zometa today.  CrCl is calculated at 41.6.  Will continue with 3 mg of Zometa today.  Weight loss is noted.  I have messaged Burtis Junes, RD about this.  Patient's family report an excellent appetite when patient is with them, but they are not sure how well she is eating at the [facility].  RD therapy was chosen due to its tolerance.  She is on 1 mg of Coumadin daily (not in need of INR monitoring) due to increased risk of VTE in the setting of Revlimid therapy.  I am concerned about her progressive dementia which is contributing to recent falls and worsening of her gait.  We discussed stopping treatment, but family is not interested at this time.  Over the next few months, we may need to address QOL and change our goals of care.  She has tolerated and responded beautifully to treatment thus far, but her dementia has been her biggest limiting condition.  Return in 8 weeks for follow-up, labs, and Zometa (labs willing).

## 2016-10-31 NOTE — Progress Notes (Signed)
Tolerated infusion w/o adverse reaction.  Alert, in no distress.  Discharged ambulatory in c/o family.  

## 2016-10-31 NOTE — Patient Instructions (Signed)
[place] at [facility] Discharge Instructions  RECOMMENDATIONS MADE BY THE CONSULTANT AND ANY TEST RESULTS WILL BE SENT TO YOUR REFERRING PHYSICIAN.  You were seen today by Kirby Crigler PA-C. Zometa today. Referring to Podiatry for toenail care. Return in 2 months for labs, zometa and follow up.   Thank you for choosing [place] at [facility] to provide your oncology and hematology care.  To afford each patient quality time with our provider, please arrive at least 15 minutes before your scheduled appointment time.   Beginning January 23rd 2017 lab work for the [facility] will be done in the  Main lab at [facility] on 1st floor. If you have a lab appointment with the [facility] please come in thru the  Main Entrance and check in at the main information desk  You need to re-schedule your appointment should you arrive 10 or more minutes late.  We strive to give you quality time with our providers, and arriving late affects you and other patients whose appointments are after yours.  Also, if you no show three or more times for appointments you may be dismissed from the clinic at the providers discretion.     Again, thank you for choosing [facility].  Our hope is that these requests will decrease the amount of time that you wait before being seen by our physicians.       _____________________________________________________________  Should you have questions after your visit to [facility], please contact our office at [phone] between the hours of 8:30 a.m. and 4:30 p.m.  Voicemails left after 4:30 p.m. will not be returned until the following business day.  For prescription refill requests, have your pharmacy contact our office.         Resources For Cancer Patients and their Caregivers ? American Cancer Society: Can assist with transportation, wigs, general needs, runs Look Good Feel Better.         312-788-6392 ? Cancer Care: Provides financial assistance, online support groups, medication/co-pay assistance.  1-800-813-HOPE (772) 823-8909) ? [place] Assists [place] cancer patients and their families through emotional , educational and financial support.  848-145-0196 ? [place] DSS Where to apply for food stamps, Medicaid and utility assistance. (204)246-7783 ? [facility]: Transportation to medical appointments. 864-135-9741 ? Social Security Administration: May apply for disability if have a Stage IV cancer. 681-707-3571 720-439-5983 ? [employer], Disability and Transit Services: Assists with nutrition, care and transit needs. [place] Support Programs: @10RELATIVEDAYS @ > Cancer Support Group  2nd Tuesday of the month 1pm-2pm, Journey Room  > Creative Journey  3rd Tuesday of the month 1130am-1pm, Journey Room  > Look Good Feel Better  1st Wednesday of the month 10am-12 noon, Journey Room (Call [place] to register [phone])

## 2016-10-31 NOTE — Progress Notes (Signed)
Janet Caprice, DO [address] [place] [address]  Multiple myeloma in remission [facility])  Hypokalemia - Plan: potassium chloride SA (K-DUR,KLOR-CON) 20 MEQ tablet  CURRENT THERAPY: RD and 1 mg of Coumadin daily and Zometa every 8 weeks  INTERVAL HISTORY: [name] 82 y.o. female returns for followup of IgG multiple myeloma presenting with severe anemia.   BMBX on 02/08/2015 with plasma cell neoplasm at 58% plasma cells.    Multiple myeloma ([place])   02/08/2015 Bone Marrow Biopsy    The bone marrow exhibits a marked increase in plasma cells (58% by aspirate), consistent with plasma cell myeloma.      03/05/2015 -  Chemotherapy    Revlimid 10 mg days 1-21 every 28 days and 40 mg of Dexamethasone weekly.      03/25/2015 Imaging    Bone survey- Generalized osteopenia with slightly heterogeneous lucency in the shafts of the humeri and femora. No discrete, sizable lytic lesions identified.      03/31/2015 -  Chemotherapy    Zometa 3 mg IV every 2 months      09/05/2016 Imaging    Skeletal survey- Diffuse osteopenia without discrete, new, or increasing lucent/lytic lesions. Previously noted lucencies in the shafts of the proximal femurs is decreased.        Chart is reviewed and recent witnessed fall resulting in left periorbital rim laceration requiring an ED visit on 10/20/2016 is noted.  CT imaging not performed given patient's advanced age and co-morbidities.  Laceration repaired with dermabond.    The patient denies any complaints today.    There is concern about her ongoing balance issues and therefore we discussed stopping therapy.  The patient's daughter and granddaughter do not want to stop therapy at this time and report that her balance is stable and not a large QOL issue at this time.  Review of Systems  Constitutional: Positive for weight loss. Negative for chills and fever.  HENT: Negative.   Eyes: Negative.   Respiratory: Negative.   Negative for cough.   Cardiovascular: Negative.  Negative for chest pain.  Gastrointestinal: Negative.  Negative for abdominal pain, constipation, diarrhea, nausea and vomiting.  Genitourinary: Negative.  Negative for dysuria.  Musculoskeletal: Positive for falls.  Skin: Negative.   Neurological: Negative.  Negative for dizziness and weakness.  Endo/Heme/Allergies: Negative.  Does not bruise/bleed easily.  Psychiatric/Behavioral: Positive for memory loss.    Past Medical History:  Diagnosis Date  . Arthritis   . Chronic leg pain   . Dementia   . Dysphagia, oropharyngeal phase   . Elevated total protein 11/10/2014  . High cholesterol   . Hypertension   . Major depressive disorder   . Multiple myeloma ([place]) 02/17/2015  . Normocytic anemia 11/10/2014  . Osteopenia 03/31/2015   On Bone scan on 03/25/2015  . Poor historian   . Psychosis   . Schizoaffective disorder   . Thrombocytopenia ([employer]) 11/10/2014    Past Surgical History:  Procedure Laterality Date  . COLONOSCOPY  2010   RMR: 1. Normal rectum 2. Sigmoid and cecal polyp, status post snare resection described above. Remainder of the colonic mucosa appeared normal.   . FOOT SURGERY    . TUBAL LIGATION      Family History  Problem Relation Age of Onset  . Diabetes Mother     Social History   Social History  . Marital status: Widowed    Spouse name: N/A  . Number  of children: N/A  . Years of education: N/A   Social History Main Topics  . Smoking status: Never Smoker  . Smokeless tobacco: Never Used  . Alcohol use No  . Drug use: No  . Sexual activity: Not Currently    Birth control/ protection: Post-menopausal   Other Topics Concern  . None   Social History Narrative  . None     PHYSICAL EXAMINATION  ECOG PERFORMANCE STATUS: 2 - Symptomatic, <50% confined to bed  Vitals:   10/31/16 1227  BP: (!) 142/68  Pulse: 70  Resp: 16    GENERAL:alert, no distress, comfortable, smiling and pleasantly  confused, accompanied by daughter and granddaughter. SKIN: skin color, texture, turgor are normal, no rashes or significant lesions HEAD: Normocephalic, No masses, lesions, tenderness.  Positive: left supraorbital laceration, healing nicely without any significant erythema and no discharge. EYES: normal, Conjunctiva are pink and non-injected EARS: External ears normal OROPHARYNX:lips, buccal mucosa, and tongue normal and mucous membranes are moist  NECK: supple, trachea midline LYMPH:  no palpable lymphadenopathy BREAST:not examined LUNGS: clear to auscultation  HEART: regular rate & rhythm ABDOMEN:abdomen soft and normal bowel sounds BACK: Back symmetric, no curvature. EXTREMITIES:less then 2 second capillary refill, no edema, no clubbing, no cyanosis, positive findings:  Varus malformation of bilateral lower legs.  NEURO: no focal motor/sensory deficits, worsening gait, progressive dementia.   LABORATORY DATA: CBC    Component Value Date/Time   WBC 5.7 10/31/2016 1003   RBC 3.91 10/31/2016 1003   HGB 13.4 10/31/2016 1003   HCT 38.6 10/31/2016 1003   PLT 129 (L) 10/31/2016 1003   MCV 98.7 10/31/2016 1003   MCH 34.3 (H) 10/31/2016 1003   MCHC 34.7 10/31/2016 1003   RDW 14.2 10/31/2016 1003   LYMPHSABS 1.1 10/31/2016 1003   MONOABS 0.3 10/31/2016 1003   EOSABS 0.1 10/31/2016 1003   BASOSABS 0.0 10/31/2016 1003      Chemistry      Component Value Date/Time   NA 138 10/31/2016 1003   K 3.4 (L) 10/31/2016 1003   CL 100 (L) 10/31/2016 1003   CO2 31 10/31/2016 1003   BUN 16 10/31/2016 1003   CREATININE 0.85 10/31/2016 1003      Component Value Date/Time   CALCIUM 9.4 10/31/2016 1003   ALKPHOS 57 10/31/2016 1003   AST 19 10/31/2016 1003   ALT 18 10/31/2016 1003   BILITOT 0.7 10/31/2016 1003     Lab Results  Component Value Date   PROT 6.8 10/31/2016   ALBUMINELP 3.5 09/05/2016   A1GS 0.3 09/05/2016   A2GS 0.7 09/05/2016   BETS 0.9 09/05/2016   BETA2SER 1.0 (L)  01/20/2015   GAMS 1.0 09/05/2016   MSPIKE 0.9 (H) 09/05/2016   SPEI Comment 09/05/2016   SPECOM Comment 09/05/2016   IGGSERUM 1,067 09/05/2016   IGGSERUM 1,023 09/05/2016   IGA 153 09/05/2016   IGA 149 09/05/2016   IGMSERUM 26 09/05/2016   IGMSERUM 28 09/05/2016   IMMELINT (NOTE) 01/20/2015   KPAFRELGTCHN 24.4 (H) 09/05/2016   LAMBDASER 15.4 09/05/2016   KAPLAMBRATIO 1.58 09/05/2016     PENDING LABS:   RADIOGRAPHIC STUDIES:  No results found.   PATHOLOGY:    ASSESSMENT AND PLAN:  Multiple myeloma (HCC) IgG multiple myeloma presenting with severe anemia.   BMBX on 02/08/2015 with plasma cell neoplasm at 58% plasma cells.  On RD and 1 mg of Coumadin daily and Zometa every 8 weeks.  Oncology history is updated.  Labs today: CBC diff,  CMET, SPEP+IFE, light chain assay, IgG, IgM, IgA, and B2M.  I personally reviewed and went over laboratory results with the patient.  The results are noted within this dictation.  Calcium is WNL.  Mild hypokalemia noted.  Kdur is escribed.  Zometa today.  CrCl is calculated at 41.6.  Will continue with 3 mg of Zometa today.  Weight loss is noted.  I have messaged Burtis Junes, RD about this.  Patient's family report an excellent appetite when patient is with them, but they are not sure how well she is eating at the [facility].  RD therapy was chosen due to its tolerance.  She is on 1 mg of Coumadin daily (not in need of INR monitoring) due to increased risk of VTE in the setting of Revlimid therapy.  I am concerned about her progressive dementia which is contributing to recent falls and worsening of her gait.  We discussed stopping treatment, but family is not interested at this time.  Over the next few months, we may need to address QOL and change our goals of care.  She has tolerated and responded beautifully to treatment thus far, but her dementia has been her biggest limiting condition.  Return in 8 weeks for follow-up, labs, and Zometa  (labs willing).   ORDERS PLACED FOR THIS ENCOUNTER: No orders of the defined types were placed in this encounter.   MEDICATIONS PRESCRIBED THIS ENCOUNTER: Meds ordered this encounter  Medications  . potassium chloride SA (K-DUR,KLOR-CON) 20 MEQ tablet    Sig: Take 1 tablet (20 mEq total) by mouth daily.    Dispense:  30 tablet    Refill:  0    Order Specific Question:   Supervising Provider    Answer:   Patrici Ranks [id number]    THERAPY PLAN:  Continue with treatment as outlined.    All questions were answered. The patient knows to call the clinic with any problems, questions or concerns. We can certainly see the patient much sooner if necessary.  Patient and plan discussed with Dr. Ancil Linsey and she is in agreement with the aforementioned.   This note is electronically signed by: Doy Mince 10/31/2016 6:47 PM

## 2016-11-01 ENCOUNTER — Telehealth (HOSPITAL_COMMUNITY): Payer: Self-pay | Admitting: Hematology & Oncology

## 2016-11-01 NOTE — Telephone Encounter (Signed)
Per Opal Sidles RN at the [facility] the will take care of pts toenail care there

## 2016-11-03 ENCOUNTER — Encounter (HOSPITAL_COMMUNITY): Payer: Medicare Other

## 2016-11-03 ENCOUNTER — Encounter (HOSPITAL_COMMUNITY): Payer: Self-pay

## 2016-11-03 NOTE — Progress Notes (Signed)
Nutrition Assessment   Reason for Assessment:   Referral received from [place], [place] regarding weight loss. Phone visit entered.  ASSESSMENT:  82 year old female with multiple myeloma.  Past medical history of dementia, athritis, dysphagia, HTN, HLD, depression, anemia, psychosis, schizoaffective disorder  Patient is a resident at [facility] facility.  Per chart family reported hat patient has an excellent appetite when patient is with them but unsure appetite while at the [facility].     Medications: MVI, fesulfate, decadron, citracal plus Vit D, benfiber, senna, KCL, miralax  Labs: reviewed K 3.4 on 12/19  Anthropometrics:   Height: 65 inches Weight: 111 lb  Noted wt on 12/8 118 lb, 10/24 118 lb and 8/22 120 lb  BMI: 18.6   Estimated Energy Needs  Kcals: 1250-1500 calories/d Protein: 60-75 g/d Fluid: 1.5 L/d    INTERVENTION:   Patient currently being followed by RD at [facility].   [facility] times 2 today and left message.  Unable to reach RD at the [facility] to discuss nutrition and weight loss concerns.      NEXT VISIT: RD at [facility] to follow  Cebastian Neis B. Zenia Resides, [place], [place] (pager)

## 2016-11-14 ENCOUNTER — Other Ambulatory Visit (HOSPITAL_COMMUNITY): Payer: Self-pay | Admitting: Oncology

## 2016-11-14 DIAGNOSIS — C9000 Multiple myeloma not having achieved remission: Secondary | ICD-10-CM

## 2016-11-14 MED ORDER — LENALIDOMIDE 10 MG PO CAPS: ORAL_CAPSULE | ORAL | 0 refills | Status: DC

## 2016-12-11 ENCOUNTER — Other Ambulatory Visit (HOSPITAL_COMMUNITY): Payer: Self-pay | Admitting: Oncology

## 2016-12-11 DIAGNOSIS — C9000 Multiple myeloma not having achieved remission: Secondary | ICD-10-CM

## 2016-12-13 ENCOUNTER — Other Ambulatory Visit (HOSPITAL_COMMUNITY): Payer: Self-pay | Admitting: Oncology

## 2016-12-13 DIAGNOSIS — C9000 Multiple myeloma not having achieved remission: Secondary | ICD-10-CM

## 2017-01-01 ENCOUNTER — Ambulatory Visit (HOSPITAL_COMMUNITY): Payer: Self-pay | Admitting: Oncology

## 2017-01-01 ENCOUNTER — Other Ambulatory Visit (HOSPITAL_COMMUNITY): Payer: Self-pay

## 2017-01-01 ENCOUNTER — Ambulatory Visit (HOSPITAL_COMMUNITY): Payer: Self-pay

## 2017-01-02 ENCOUNTER — Encounter (HOSPITAL_COMMUNITY): Payer: Self-pay | Admitting: Oncology

## 2017-01-02 ENCOUNTER — Encounter (HOSPITAL_COMMUNITY): Payer: Medicare Other

## 2017-01-02 ENCOUNTER — Encounter (HOSPITAL_COMMUNITY): Payer: Medicare Other | Attending: Oncology | Admitting: Oncology

## 2017-01-02 ENCOUNTER — Encounter (HOSPITAL_BASED_OUTPATIENT_CLINIC_OR_DEPARTMENT_OTHER): Payer: Medicare Other

## 2017-01-02 DIAGNOSIS — C9001 Multiple myeloma in remission: Secondary | ICD-10-CM

## 2017-01-02 DIAGNOSIS — F0390 Unspecified dementia without behavioral disturbance: Secondary | ICD-10-CM | POA: Diagnosis not present

## 2017-01-02 DIAGNOSIS — Z833 Family history of diabetes mellitus: Secondary | ICD-10-CM | POA: Insufficient documentation

## 2017-01-02 DIAGNOSIS — D649 Anemia, unspecified: Secondary | ICD-10-CM | POA: Insufficient documentation

## 2017-01-02 DIAGNOSIS — F329 Major depressive disorder, single episode, unspecified: Secondary | ICD-10-CM | POA: Diagnosis not present

## 2017-01-02 DIAGNOSIS — Z9181 History of falling: Secondary | ICD-10-CM

## 2017-01-02 DIAGNOSIS — R296 Repeated falls: Secondary | ICD-10-CM | POA: Diagnosis not present

## 2017-01-02 DIAGNOSIS — E7800 Pure hypercholesterolemia, unspecified: Secondary | ICD-10-CM | POA: Diagnosis not present

## 2017-01-02 DIAGNOSIS — R634 Abnormal weight loss: Secondary | ICD-10-CM | POA: Diagnosis not present

## 2017-01-02 DIAGNOSIS — M8580 Other specified disorders of bone density and structure, unspecified site: Secondary | ICD-10-CM

## 2017-01-02 DIAGNOSIS — F259 Schizoaffective disorder, unspecified: Secondary | ICD-10-CM | POA: Insufficient documentation

## 2017-01-02 DIAGNOSIS — Z7901 Long term (current) use of anticoagulants: Secondary | ICD-10-CM | POA: Diagnosis not present

## 2017-01-02 DIAGNOSIS — I10 Essential (primary) hypertension: Secondary | ICD-10-CM | POA: Insufficient documentation

## 2017-01-02 DIAGNOSIS — Z9889 Other specified postprocedural states: Secondary | ICD-10-CM | POA: Diagnosis not present

## 2017-01-02 DIAGNOSIS — R630 Anorexia: Secondary | ICD-10-CM

## 2017-01-02 DIAGNOSIS — Z9221 Personal history of antineoplastic chemotherapy: Secondary | ICD-10-CM | POA: Diagnosis not present

## 2017-01-02 LAB — COMPREHENSIVE METABOLIC PANEL
ALBUMIN: 3.9 g/dL (ref 3.5–5.0)
ALT: 21 U/L (ref 14–54)
AST: 20 U/L (ref 15–41)
Alkaline Phosphatase: 45 U/L (ref 38–126)
Anion gap: 5 (ref 5–15)
BUN: 18 mg/dL (ref 6–20)
CHLORIDE: 104 mmol/L (ref 101–111)
CO2: 30 mmol/L (ref 22–32)
CREATININE: 0.87 mg/dL (ref 0.44–1.00)
Calcium: 9.2 mg/dL (ref 8.9–10.3)
GFR calc Af Amer: >60 mL/min (ref >60)
GFR calc non Af Amer: 59 mL/min — ABNORMAL LOW (ref >60)
GLUCOSE: 65 mg/dL (ref 65–99)
POTASSIUM: 4 mmol/L (ref 3.5–5.1)
Sodium: 139 mmol/L (ref 135–145)
Total Bilirubin: 0.6 mg/dL (ref 0.3–1.2)
Total Protein: 6.5 g/dL (ref 6.5–8.1)

## 2017-01-02 LAB — CBC WITH DIFFERENTIAL/PLATELET
Basophils Absolute: 0.1 10*3/uL (ref 0.0–0.1)
Basophils Relative: 2 %
EOS ABS: 0.2 10*3/uL (ref 0.0–0.7)
EOS PCT: 6 %
HCT: 36.7 % (ref 36.0–46.0)
Hemoglobin: 12.5 g/dL (ref 12.0–15.0)
LYMPHS ABS: 1 10*3/uL (ref 0.7–4.0)
LYMPHS PCT: 28 %
MCH: 33.9 pg (ref 26.0–34.0)
MCHC: 34.1 g/dL (ref 30.0–36.0)
MCV: 99.5 fL (ref 78.0–100.0)
MONO ABS: 0.5 10*3/uL (ref 0.1–1.0)
MONOS PCT: 15 %
Neutro Abs: 1.7 10*3/uL (ref 1.7–7.7)
Neutrophils Relative %: 49 %
PLATELETS: 107 10*3/uL — AB (ref 150–400)
RBC: 3.69 MIL/uL — AB (ref 3.87–5.11)
RDW: 14.5 % (ref 11.5–15.5)
WBC: 3.5 10*3/uL — ABNORMAL LOW (ref 4.0–10.5)

## 2017-01-02 MED ORDER — SODIUM CHLORIDE 0.9 % IV SOLN
Freq: Once | INTRAVENOUS | Status: AC
  Administered 2017-01-02: 12:00:00 via INTRAVENOUS

## 2017-01-02 MED ORDER — ZOLEDRONIC ACID 4 MG/5ML IV CONC
3.0000 mg | Freq: Once | INTRAVENOUS | Status: AC
  Administered 2017-01-02: 3 mg via INTRAVENOUS
  Filled 2017-01-02: qty 3.75

## 2017-01-02 NOTE — Progress Notes (Signed)
[name], DO [address] [place] [address]  Multiple myeloma in remission [facility])  CURRENT THERAPY: RD and 1 mg of Coumadin daily and Zometa every 8 weeks  INTERVAL HISTORY: [name] 83 y.o. female returns for followup of IgG multiple myeloma presenting with severe anemia.   BMBX on 02/08/2015 with plasma cell neoplasm at 58% plasma cells.    Multiple myeloma ([place])   02/08/2015 Bone Marrow Biopsy    The bone marrow exhibits a marked increase in plasma cells (58% by aspirate), consistent with plasma cell myeloma.      03/05/2015 -  Chemotherapy    Revlimid 10 mg days 1-21 every 28 days and 40 mg of Dexamethasone weekly.      03/25/2015 Imaging    Bone survey- Generalized osteopenia with slightly heterogeneous lucency in the shafts of the humeri and femora. No discrete, sizable lytic lesions identified.      03/31/2015 -  Chemotherapy    Zometa 3 mg IV every 2 months      09/05/2016 Imaging    Skeletal survey- Diffuse osteopenia without discrete, new, or increasing lucent/lytic lesions. Previously noted lucencies in the shafts of the proximal femurs is decreased.       Unfortunately, she has fallen x 4 since being seen last.  Her family reports that she drags her one foot. This causes an imbalance.  Fortunately, she has not injured herself in these falls.  The patient's family reports difficulty with eating.  Her weight continues to decline.  They report that the patient just does not eat much and does not have an appetite.  I discussed discontinuation of therapy but the family is not interested at this point in time her blood counts are certainly reasonable for ongoing treatment.  I am concerned about her ongoing weight loss and falls.  Her dementia is her biggest issue at this point in time.  Review of Systems  Constitutional: Positive for weight loss. Negative for chills and fever.  HENT: Negative.   Eyes: Negative.   Respiratory:  Negative.  Negative for cough.   Cardiovascular: Negative.  Negative for chest pain.  Gastrointestinal: Negative.  Negative for abdominal pain, constipation, diarrhea, nausea and vomiting.  Genitourinary: Negative.  Negative for dysuria.  Musculoskeletal: Positive for falls.  Skin: Negative.   Neurological: Negative.  Negative for dizziness and weakness.  Endo/Heme/Allergies: Negative.  Does not bruise/bleed easily.  Psychiatric/Behavioral: Positive for memory loss.    Past Medical History:  Diagnosis Date  . Arthritis   . Chronic leg pain   . Dementia   . Dysphagia, oropharyngeal phase   . Elevated total protein 11/10/2014  . High cholesterol   . Hypertension   . Major depressive disorder   . Multiple myeloma ([place]) 02/17/2015  . Normocytic anemia 11/10/2014  . Osteopenia 03/31/2015   On Bone scan on 03/25/2015  . Poor historian   . Psychosis   . Schizoaffective disorder   . Thrombocytopenia ([place]) 11/10/2014    Past Surgical History:  Procedure Laterality Date  . COLONOSCOPY  2010   RMR: 1. Normal rectum 2. Sigmoid and cecal polyp, status post snare resection described above. Remainder of the colonic mucosa appeared normal.   . FOOT SURGERY    . TUBAL LIGATION      Family History  Problem Relation Age of Onset  . Diabetes Mother     Social History   Social History  . Marital status: Widowed  Spouse name: N/A  . Number of children: N/A  . Years of education: N/A   Social History Main Topics  . Smoking status: Never Smoker  . Smokeless tobacco: Never Used  . Alcohol use No  . Drug use: No  . Sexual activity: Not Currently    Birth control/ protection: Post-menopausal   Other Topics Concern  . None   Social History Narrative  . None     PHYSICAL EXAMINATION  ECOG PERFORMANCE STATUS: 2 - Symptomatic, <50% confined to bed  Vitals:   01/02/17 1110  BP: 133/63  Pulse: 64  Resp: 16    GENERAL:alert, no distress, comfortable, smiling and pleasantly  confused, accompanied by daughter and granddaughter. SKIN: skin color, texture, turgor are normal, no rashes or significant lesions HEAD: Normocephalic, No masses, lesions, tenderness.   EYES: normal, Conjunctiva are pink and non-injected EARS: External ears normal OROPHARYNX:lips, buccal mucosa, and tongue normal and mucous membranes are moist  NECK: supple, trachea midline LYMPH:  no palpable lymphadenopathy BREAST:not examined LUNGS: clear to auscultation, poor patient effort.   HEART: regular rate & rhythm ABDOMEN:abdomen soft and normal bowel sounds BACK: Back symmetric, no curvature. EXTREMITIES:less then 2 second capillary refill, no edema, no clubbing, no cyanosis, positive findings:  Varus malformation of bilateral lower legs.  NEURO: no focal motor/sensory deficits, worsening gait, progressive dementia.   LABORATORY DATA: CBC    Component Value Date/Time   WBC 3.5 (L) 01/02/2017 0951   RBC 3.69 (L) 01/02/2017 0951   HGB 12.5 01/02/2017 0951   HCT 36.7 01/02/2017 0951   PLT 107 (L) 01/02/2017 0951   MCV 99.5 01/02/2017 0951   MCH 33.9 01/02/2017 0951   MCHC 34.1 01/02/2017 0951   RDW 14.5 01/02/2017 0951   LYMPHSABS 1.0 01/02/2017 0951   MONOABS 0.5 01/02/2017 0951   EOSABS 0.2 01/02/2017 0951   BASOSABS 0.1 01/02/2017 0951      Chemistry      Component Value Date/Time   NA 139 01/02/2017 0951   K 4.0 01/02/2017 0951   CL 104 01/02/2017 0951   CO2 30 01/02/2017 0951   BUN 18 01/02/2017 0951   CREATININE 0.87 01/02/2017 0951      Component Value Date/Time   CALCIUM 9.2 01/02/2017 0951   ALKPHOS 45 01/02/2017 0951   AST 20 01/02/2017 0951   ALT 21 01/02/2017 0951   BILITOT 0.6 01/02/2017 0951     Lab Results  Component Value Date   PROT 6.5 01/02/2017   ALBUMINELP 3.5 09/05/2016   A1GS 0.3 09/05/2016   A2GS 0.7 09/05/2016   BETS 0.9 09/05/2016   BETA2SER 1.0 (L) 01/20/2015   GAMS 1.0 09/05/2016   MSPIKE 0.9 (H) 09/05/2016   SPEI Comment  09/05/2016   SPECOM Comment 09/05/2016   IGGSERUM 1,067 09/05/2016   IGGSERUM 1,023 09/05/2016   IGA 153 09/05/2016   IGA 149 09/05/2016   IGMSERUM 26 09/05/2016   IGMSERUM 28 09/05/2016   IMMELINT (NOTE) 01/20/2015   KPAFRELGTCHN 24.4 (H) 09/05/2016   LAMBDASER 15.4 09/05/2016   KAPLAMBRATIO 1.58 09/05/2016     PENDING LABS:   RADIOGRAPHIC STUDIES:  No results found.   PATHOLOGY:    ASSESSMENT AND PLAN:  Multiple myeloma (HCC) IgG multiple myeloma presenting with severe anemia.   BMBX on 02/08/2015 with plasma cell neoplasm at 58% plasma cells.  On RD and 1 mg of Coumadin daily and Zometa every 8 weeks.  Oncology history is updated.  Labs today: CBC diff, CMET, SPEP+IFE, light chain  assay, IgG, IgM, IgA, and B2M.  I personally reviewed and went over laboratory results with the patient.  The results are noted within this dictation.    Zometa today.   Ongoing weight loss is noted.  I think the patient's dementia is playing a big part in this.  RD therapy was chosen due to its tolerance.  She is on 1 mg of Coumadin daily (not in need of INR monitoring) due to increased risk of VTE in the setting of Revlimid therapy.  I am concerned about her progressive dementia which is contributing to recent falls and worsening of her gait, in addition to her ongoing weight loss.  We discussed stopping treatment, but family is not interested at this time.  Quality of life issues were broached today.  Goals of care were discussed as well.  She has tolerated and responded beautifully to treatment thus far, but her dementia has been her biggest limiting condition.  If her weight falls below 100 pounds, the patient's family is advised that we should stop therapy.  I have encouraged ongoing nutritional supplementation with meals.  Return in 8 weeks for follow-up, labs, and Zometa (labs willing).   ORDERS PLACED FOR THIS ENCOUNTER: No orders of the defined types were placed in this  encounter.   MEDICATIONS PRESCRIBED THIS ENCOUNTER: No orders of the defined types were placed in this encounter.   THERAPY PLAN:  Continue with treatment as outlined.    All questions were answered. The patient knows to call the clinic with any problems, questions or concerns. We can certainly see the patient much sooner if necessary.  Patient and plan discussed with Dr. Twana First and she is in agreement with the aforementioned.   This note is electronically signed by: Doy Mince 01/02/2017 5:15 PM

## 2017-01-02 NOTE — Assessment & Plan Note (Signed)
IgG multiple myeloma presenting with severe anemia.   BMBX on 02/08/2015 with plasma cell neoplasm at 58% plasma cells.  On RD and 1 mg of Coumadin daily and Zometa every 8 weeks.  Oncology history is updated.  Labs today: CBC diff, CMET, SPEP+IFE, light chain assay, IgG, IgM, IgA, and B2M.  I personally reviewed and went over laboratory results with the patient.  The results are noted within this dictation.    Zometa today.   Ongoing weight loss is noted.  I think the patient's dementia is playing a big part in this.  RD therapy was chosen due to its tolerance.  She is on 1 mg of Coumadin daily (not in need of INR monitoring) due to increased risk of VTE in the setting of Revlimid therapy.  I am concerned about her progressive dementia which is contributing to recent falls and worsening of her gait, in addition to her ongoing weight loss.  We discussed stopping treatment, but family is not interested at this time.  Quality of life issues were broached today.  Goals of care were discussed as well.  She has tolerated and responded beautifully to treatment thus far, but her dementia has been her biggest limiting condition.  If her weight falls below 100 pounds, the patient's family is advised that we should stop therapy.  I have encouraged ongoing nutritional supplementation with meals.  Return in 8 weeks for follow-up, labs, and Zometa (labs willing).

## 2017-01-02 NOTE — Patient Instructions (Addendum)
[place] at [facility] Discharge Instructions  RECOMMENDATIONS MADE BY THE CONSULTANT AND ANY TEST RESULTS WILL BE SENT TO YOUR REFERRING PHYSICIAN.  You were seen today by Kirby Crigler PA-C. Zometa today. Continue Revlimid as planned. Zometa and follow up in 8 weeks.     Thank you for choosing [place] at [facility] to provide your oncology and hematology care.  To afford each patient quality time with our provider, please arrive at least 15 minutes before your scheduled appointment time.    If you have a lab appointment with the [facility] please come in thru the  Main Entrance and check in at the main information desk  You need to re-schedule your appointment should you arrive 10 or more minutes late.  We strive to give you quality time with our providers, and arriving late affects you and other patients whose appointments are after yours.  Also, if you no show three or more times for appointments you may be dismissed from the clinic at the providers discretion.     Again, thank you for choosing [facility].  Our hope is that these requests will decrease the amount of time that you wait before being seen by our physicians.       _____________________________________________________________  Should you have questions after your visit to [facility], please contact our office at [phone] between the hours of 8:30 a.m. and 4:30 p.m.  Voicemails left after 4:30 p.m. will not be returned until the following business day.  For prescription refill requests, have your pharmacy contact our office.       Resources For Cancer Patients and their Caregivers ? American Cancer Society: Can assist with transportation, wigs, general needs, runs Look Good Feel Better.        (402) 151-2864 ? Cancer Care: Provides financial assistance, online support groups, medication/co-pay assistance.  1-800-813-HOPE  (630)139-2127) ? [place] Assists [place] cancer patients and their families through emotional , educational and financial support.  (765)516-1642 ? [place] DSS Where to apply for food stamps, Medicaid and utility assistance. 7182414902 ? [facility]: Transportation to medical appointments. (587)505-6677 ? Social Security Administration: May apply for disability if have a Stage IV cancer. (928)351-5924 (838)696-2139 ? [employer], Disability and Transit Services: Assists with nutrition, care and transit needs. [place] Support Programs: @10RELATIVEDAYS @ > Cancer Support Group  2nd Tuesday of the month 1pm-2pm, Journey Room  > Creative Journey  3rd Tuesday of the month 1130am-1pm, Journey Room  > Look Good Feel Better  1st Wednesday of the month 10am-12 noon, Journey Room (Call [place] to register [phone])

## 2017-01-02 NOTE — Progress Notes (Signed)
Tolerated zometa infusion well. Stable on discharge home with family via wheelchair.

## 2017-01-03 LAB — IGG, IGA, IGM
IGA: 149 mg/dL (ref 64–422)
IgG (Immunoglobin G), Serum: 913 mg/dL (ref 700–1600)
IgM, Serum: 20 mg/dL — ABNORMAL LOW (ref 26–217)

## 2017-01-03 LAB — PROTEIN ELECTROPHORESIS, SERUM
A/G RATIO SPE: 0.7 (ref 0.7–1.7)
ALBUMIN ELP: 2.6 g/dL — AB (ref 2.9–4.4)
ALPHA-1-GLOBULIN: 0.2 g/dL (ref 0.0–0.4)
ALPHA-2-GLOBULIN: 0.6 g/dL (ref 0.4–1.0)
Beta Globulin: 0.8 g/dL (ref 0.7–1.3)
GLOBULIN, TOTAL: 3.6 (ref 2.2–3.9)
Gamma Globulin: 2 g/dL — ABNORMAL HIGH (ref 0.4–1.8)
M-Spike, %: 1.8 g/dL — ABNORMAL HIGH
TOTAL PROTEIN ELP: 6.2 g/dL (ref 6.0–8.5)

## 2017-01-03 LAB — KAPPA/LAMBDA LIGHT CHAINS
KAPPA FREE LGHT CHN: 32.1 mg/L — AB (ref 3.3–19.4)
Kappa, lambda light chain ratio: 1.78 — ABNORMAL HIGH (ref 0.26–1.65)
LAMDA FREE LIGHT CHAINS: 18 mg/L (ref 5.7–26.3)

## 2017-01-03 LAB — BETA 2 MICROGLOBULIN, SERUM: Beta-2 Microglobulin: 2.8 mg/L — ABNORMAL HIGH (ref 0.6–2.4)

## 2017-01-05 LAB — IMMUNOFIXATION ELECTROPHORESIS
IGA: 158 mg/dL (ref 64–422)
IGG (IMMUNOGLOBIN G), SERUM: 940 mg/dL (ref 700–1600)
IgM, Serum: 22 mg/dL — ABNORMAL LOW (ref 26–217)
Total Protein ELP: 6.4 g/dL (ref 6.0–8.5)

## 2017-01-08 ENCOUNTER — Telehealth (HOSPITAL_COMMUNITY): Payer: Self-pay

## 2017-01-08 NOTE — Telephone Encounter (Signed)
Jan at [facility] called concerned because patient had not had a PT/INR since October. Reviewed with PA-C. Since she is only on 1 mg of coumadin prophylacticaly a PT/INR is not needed. Notified Jan at [facility]. She stated she was going to get the NP there to order the lab work. I tried to explain it was no need but she is concerned that they will be in trouble if "state" come in .

## 2017-01-25 ENCOUNTER — Emergency Department (HOSPITAL_COMMUNITY): Payer: Medicare Other

## 2017-01-25 ENCOUNTER — Emergency Department (HOSPITAL_COMMUNITY)
Admission: EM | Admit: 2017-01-25 | Discharge: 2017-01-25 | Disposition: A | Discharge status: Home / Self Care | Payer: Medicare Other | Attending: Emergency Medicine | Admitting: Emergency Medicine

## 2017-01-25 ENCOUNTER — Telehealth (HOSPITAL_COMMUNITY): Payer: Self-pay | Admitting: *Deleted

## 2017-01-25 ENCOUNTER — Other Ambulatory Visit: Payer: Self-pay

## 2017-01-25 ENCOUNTER — Encounter (HOSPITAL_COMMUNITY): Payer: Self-pay | Admitting: Emergency Medicine

## 2017-01-25 DIAGNOSIS — R531 Weakness: Secondary | ICD-10-CM | POA: Diagnosis not present

## 2017-01-25 DIAGNOSIS — F0390 Unspecified dementia without behavioral disturbance: Secondary | ICD-10-CM | POA: Insufficient documentation

## 2017-01-25 DIAGNOSIS — Z7901 Long term (current) use of anticoagulants: Secondary | ICD-10-CM | POA: Insufficient documentation

## 2017-01-25 DIAGNOSIS — Z79899 Other long term (current) drug therapy: Secondary | ICD-10-CM | POA: Diagnosis not present

## 2017-01-25 DIAGNOSIS — I129 Hypertensive chronic kidney disease with stage 1 through stage 4 chronic kidney disease, or unspecified chronic kidney disease: Secondary | ICD-10-CM | POA: Diagnosis not present

## 2017-01-25 DIAGNOSIS — N183 Chronic kidney disease, stage 3 (moderate): Secondary | ICD-10-CM | POA: Diagnosis not present

## 2017-01-25 DIAGNOSIS — R630 Anorexia: Secondary | ICD-10-CM | POA: Diagnosis present

## 2017-01-25 LAB — CBC WITH DIFFERENTIAL/PLATELET
BASOS ABS: 0 10*3/uL (ref 0.0–0.1)
BASOS PCT: 1 %
Eosinophils Absolute: 0.1 10*3/uL (ref 0.0–0.7)
Eosinophils Relative: 2 %
HEMATOCRIT: 35.2 % — AB (ref 36.0–46.0)
HEMOGLOBIN: 12.2 g/dL (ref 12.0–15.0)
LYMPHS PCT: 19 %
Lymphs Abs: 0.9 10*3/uL (ref 0.7–4.0)
MCH: 33.8 pg (ref 26.0–34.0)
MCHC: 34.7 g/dL (ref 30.0–36.0)
MCV: 97.5 fL (ref 78.0–100.0)
Monocytes Absolute: 0.5 10*3/uL (ref 0.1–1.0)
Monocytes Relative: 10 %
NEUTROS ABS: 3.1 10*3/uL (ref 1.7–7.7)
NEUTROS PCT: 68 %
Platelets: 121 10*3/uL — ABNORMAL LOW (ref 150–400)
RBC: 3.61 MIL/uL — AB (ref 3.87–5.11)
RDW: 13.9 % (ref 11.5–15.5)
WBC: 4.6 10*3/uL (ref 4.0–10.5)

## 2017-01-25 LAB — URINALYSIS, ROUTINE W REFLEX MICROSCOPIC
BACTERIA UA: NONE SEEN
BILIRUBIN URINE: NEGATIVE
Glucose, UA: >=500 mg/dL — AB
KETONES UR: NEGATIVE mg/dL
LEUKOCYTES UA: NEGATIVE
NITRITE: NEGATIVE
PROTEIN: NEGATIVE mg/dL
SPECIFIC GRAVITY, URINE: 1.021 (ref 1.005–1.030)
pH: 5 (ref 5.0–8.0)

## 2017-01-25 LAB — COMPREHENSIVE METABOLIC PANEL
ALBUMIN: 3.6 g/dL (ref 3.5–5.0)
ALK PHOS: 81 U/L (ref 38–126)
ALT: 34 U/L (ref 14–54)
ANION GAP: 8 (ref 5–15)
AST: 21 U/L (ref 15–41)
BILIRUBIN TOTAL: 0.5 mg/dL (ref 0.3–1.2)
BUN: 22 mg/dL — ABNORMAL HIGH (ref 6–20)
CALCIUM: 9.1 mg/dL (ref 8.9–10.3)
CO2: 27 mmol/L (ref 22–32)
Chloride: 109 mmol/L (ref 101–111)
Creatinine, Ser: 0.82 mg/dL (ref 0.44–1.00)
GFR calc non Af Amer: >60 mL/min (ref >60)
Glucose, Bld: 219 mg/dL — ABNORMAL HIGH (ref 65–99)
POTASSIUM: 3.8 mmol/L (ref 3.5–5.1)
SODIUM: 144 mmol/L (ref 135–145)
TOTAL PROTEIN: 6.5 g/dL (ref 6.5–8.1)

## 2017-01-25 LAB — I-STAT CG4 LACTIC ACID, ED: Lactic Acid, Venous: 2.43 mmol/L (ref 0.5–1.9)

## 2017-01-25 MED ORDER — SODIUM CHLORIDE 0.9 % IV BOLUS (SEPSIS)
1000.0000 mL | Freq: Once | INTRAVENOUS | Status: AC
  Administered 2017-01-25: 1000 mL via INTRAVENOUS

## 2017-01-25 NOTE — ED Notes (Signed)
[facility] staff at bedside.

## 2017-01-25 NOTE — ED Provider Notes (Signed)
[place] DEPT Provider Note   CSN: [id number] Arrival date & time: 01/25/17  1216     History   Chief Complaint Chief Complaint  Patient presents with  . Failure To Thrive    HPI [name] is a 83 y.o. female.  HPI She presents with 2 family members due to concern of increasing anorexia, change in interactivity. Patient has multiple medical issues, most notably, dementia, multiple myeloma. Patient is currently receiving chemotherapy, every 3 weeks for myeloma, last session was 2 weeks ago. Family states that the patient takes 1 mg Coumadin, prophylactically given her chemotherapeutic regimen. They also report that the patient is being monitored for weight loss, and they recall being told that if the patient has continued weight loss, below 100 pound threshold, she will not a candidate for additional chemotherapy. Per their report the patient was recently at 108 pounds, but today was 106 pounds.  The patient herself has very soft, inconsistent speech, consistent with dementia.  5 caveat.    Past Medical History:  Diagnosis Date  . Arthritis   . Chronic leg pain   . Dementia   . Dysphagia, oropharyngeal phase   . Elevated total protein 11/10/2014  . High cholesterol   . Hypertension   . Major depressive disorder   . Multiple myeloma ([place]) 02/17/2015  . Normocytic anemia 11/10/2014  . Osteopenia 03/31/2015   On Bone scan on 03/25/2015  . Poor historian   . Psychosis   . Schizoaffective disorder   . Thrombocytopenia ([place]) 11/10/2014    Patient Active Problem List   Diagnosis Date Noted  . Fever 01/20/2016  . Influenza A 01/20/2016  . Dehydration 01/20/2016  . CKD (chronic kidney disease) stage 3, GFR 30-59 ml/min 04/14/2015  . Hyperglycemia 04/14/2015  . Generalized weakness 04/14/2015  . Dementia   . Osteopenia 03/31/2015  . Multiple myeloma ([place]) 02/17/2015  . Constipation 12/08/2014  . Normocytic anemia 11/10/2014  . Thrombocytopenia ([place])  11/10/2014  . Elevated total protein 11/10/2014  . Anemia 11/09/2014  . Vaginal bleeding 07/22/2014  . Schizoaffective disorder, unspecified type ([place]) 07/22/2014  . Vagina bleeding 07/22/2014  . Dementia without behavioral disturbance 07/16/2014  . Altered mental status 07/16/2014  . Essential hypertension 07/16/2014  . Routine gynecological examination 07/16/2014  . Cervicitis 07/16/2014  . GERD 08/25/2009  . OTHER DYSPHAGIA 08/25/2009  . CERVICAL MUSCLE STRAIN 02/11/2009  . COLONIC POLYPS, ADENOMATOUS, HX OF 02/11/2009  . HEMORRHOIDS, INTERNAL 02/10/2009    Past Surgical History:  Procedure Laterality Date  . COLONOSCOPY  2010   RMR: 1. Normal rectum 2. Sigmoid and cecal polyp, status post snare resection described above. Remainder of the colonic mucosa appeared normal.   . FOOT SURGERY    . TUBAL LIGATION      OB History    No data available       Home Medications    Prior to Admission medications   Medication Sig Start Date End Date Taking? Authorizing Provider  acetaminophen (TYLENOL) 325 MG tablet Take 650 mg by mouth every 4 (four) hours as needed for fever.    Historical Provider, MD  amLODipine (NORVASC) 2.5 MG tablet Take 2.5 mg by mouth daily.    Historical Provider, MD  calcium carbonate (TUMS - DOSED IN MG ELEMENTAL CALCIUM) 500 MG chewable tablet Chew 2 tablets by mouth daily.    Historical Provider, MD  calcium citrate-vitamin D (CITRACAL+D) 315-200 MG-UNIT tablet Take 1 tablet by mouth 2 (two) times daily.    Historical  Provider, MD  cholecalciferol (VITAMIN D) 1000 units tablet Take 500 Units by mouth daily.    Historical Provider, MD  clonazePAM (KLONOPIN) 0.25 MG disintegrating tablet Take 0.25 mg by mouth at bedtime.    Historical Provider, MD  dexamethasone (DECADRON) 4 MG tablet Take 10 tablets (40 mg total) by mouth once a week. Patient taking differently: Take 40 mg by mouth once a week. Receives every Thursday. 04/15/15   Gwenyth Bender, NP  fentaNYL  (DURAGESIC - DOSED MCG/HR) 12 MCG/HR Place 1 patch (12.5 mcg total) onto the skin every 3 (three) days. Patient taking differently: Place 12 mcg onto the skin every 3 (three) days.  04/15/15   Gwenyth Bender, NP  ferrous sulfate 325 (65 FE) MG tablet Take 1 tablet (325 mg total) by mouth 3 (three) times daily with meals. Patient taking differently: Take 325 mg by mouth 4 (four) times daily.  07/24/14   Vassie Loll, MD  fluticasone [facility]) 50 MCG/ACT nasal spray Place 2 sprays into both nostrils daily.    Historical Provider, MD  HYDROcodone-acetaminophen (NORCO/VICODIN) 5-325 MG per tablet Take 1 tablet by mouth at bedtime as needed (pain). Patient taking differently: Take 1 tablet by mouth every 12 (twelve) hours as needed (pain).  04/15/15   Gwenyth Bender, NP  loperamide (IMODIUM) 2 MG capsule Take 2 mg by mouth as needed for diarrhea or loose stools.    Historical Provider, MD  loratadine (CLARITIN) 10 MG tablet Take 1 tablet (10 mg total) by mouth daily. 07/04/16   Allene Pyo, MD  Melatonin 3 MG CAPS Take 3 mg by mouth at bedtime.     Historical Provider, MD  Multiple Vitamins-Minerals (MULTIVITAMIN WITH MINERALS) tablet Take 1 tablet by mouth daily.    Historical Provider, MD  polyethylene glycol (MIRALAX / GLYCOLAX) packet Take 17 g by mouth at bedtime.     Historical Provider, MD  potassium chloride SA (K-DUR,KLOR-CON) 20 MEQ tablet Take 1 tablet (20 mEq total) by mouth daily. 10/31/16   Ellouise Newer, PA-C  REVLIMID 10 MG capsule TAKE 1 CAPSULE BY MOUTH DAILY FOR 21 DAYS ON FOLLOWED BY 7 DAYS OFF. 12/13/16   Ellouise Newer, PA-C  senna (SENOKOT) 8.6 MG TABS tablet Take 1 tablet by mouth 2 (two) times daily.    Historical Provider, MD  warfarin (COUMADIN) 1 MG tablet Take 1 tablet (1 mg total) by mouth daily. 02/17/15   Ellouise Newer, PA-C  Wheat Dextrin (BENEFIBER DRINK MIX) PACK Take by mouth. Take 2 teaspoons by mouth everyday.    Historical Provider, MD    Family  History Family History  Problem Relation Age of Onset  . Diabetes Mother     Social History Social History  Substance Use Topics  . Smoking status: Never Smoker  . Smokeless tobacco: Never Used  . Alcohol use No     Allergies   Pollen extract   Review of Systems Review of Systems  Unable to perform ROS: Dementia     Physical Exam Updated Vital Signs BP 152/77   Pulse 83   Temp 98.4 F (36.9 C)   Resp 18   Ht 5\' 3"  (1.6 m)   Wt 100 lb (45.4 kg)   SpO2 100%   BMI 17.71 kg/m   Physical Exam  Constitutional: She appears listless. She has a sickly appearance. No distress.  HENT:  Head: Normocephalic and atraumatic.  Eyes: Conjunctivae and EOM are normal.  Cardiovascular: Normal rate and regular  rhythm.   Pulmonary/Chest: Effort normal and breath sounds normal. No stridor. No respiratory distress.  Abdominal: She exhibits no distension.  Musculoskeletal: She exhibits no edema.  Neurological: She appears listless. She is disoriented. She displays no tremor. She exhibits abnormal muscle tone. She displays no seizure activity.  Patient does not follow commands reliably, does seem to move all extremities spontaneously, has inconsistent, brief, difficult to understand speech.  Skin: Skin is warm and dry.  Psychiatric: She is withdrawn. Cognition and memory are impaired.  Nursing note and vitals reviewed.    ED Treatments / Results  Labs (all labs ordered are listed, but only abnormal results are displayed) Labs Reviewed  COMPREHENSIVE METABOLIC PANEL - Abnormal; Notable for the following:       Result Value   Glucose, Bld 219 (*)    BUN 22 (*)    All other components within normal limits  CBC WITH DIFFERENTIAL/PLATELET - Abnormal; Notable for the following:    RBC 3.61 (*)    HCT 35.2 (*)    Platelets 121 (*)    All other components within normal limits  URINALYSIS, ROUTINE W REFLEX MICROSCOPIC - Abnormal; Notable for the following:    Glucose, UA >=500 (*)     Hgb urine dipstick SMALL (*)    All other components within normal limits  I-STAT CG4 LACTIC ACID, ED - Abnormal; Notable for the following:    Lactic Acid, Venous 2.43 (*)    All other components within normal limits    EKG  EKG Interpretation  Date/Time:  Thursday January 25 2017 12:17:27 EDT Ventricular Rate:  79 PR Interval:    QRS Duration: 78 QT Interval:  386 QTC Calculation: 443 R Axis:   27 Text Interpretation:  Sinus rhythm Ventricular premature complex Borderline short PR interval Borderline T wave abnormalities No significant change since last tracing Abnormal ekg Confirmed by Gerhard Munch  MD [phone]) on 01/25/2017 1:46:27 PM      Chart review notable for ongoing conversations with the patient's care team, including concern for worsening dementia, worsening anorexia. Radiology Ct Head Wo Contrast  Result Date: 01/25/2017 CLINICAL DATA:  83 y/o F; ALOC on Coumadin. History of multiple myeloma. EXAM: CT HEAD WITHOUT CONTRAST TECHNIQUE: Contiguous axial images were obtained from the base of the skull through the vertex without intravenous contrast. COMPARISON:  09/05/2016 CT of the head. FINDINGS: Brain: No evidence of acute infarction, hemorrhage, hydrocephalus, extra-axial collection or mass lesion/mass effect. Stable mild chronic microvascular ischemic changes and parenchymal volume loss of the brain. Vascular: Moderate calcific atherosclerosis of cavernous and paraclinoid internal carotid arteries. Skull: Multiple lucent foci within the calvarium compatible with history of multiple myeloma. Sinuses/Orbits: Small fluid level within the left posterior ethmoid air cells. Otherwise negative. Other: None. IMPRESSION: 1. No acute intracranial abnormality identified. 2. Mild chronic microvascular ischemic changes and mild parenchymal volume loss of the brain. 3. Stable lucent foci within the calvarium compatible with history of multiple myeloma. Electronically Signed   By: Mitzi Hansen M.D.   On: 01/25/2017 14:01   Dg Chest Port 1 View  Result Date: 01/25/2017 CLINICAL DATA:  Failure to thrive, weakness, hypertension EXAM: PORTABLE CHEST 1 VIEW COMPARISON:  09/05/2016 FINDINGS: Heart is borderline in size. Minimal left base atelectasis. Right lung is clear. No effusions or edema. No acute bony abnormality. IMPRESSION: Borderline heart size.  Left base atelectasis. Electronically Signed   By: Charlett Nose M.D.   On: 01/25/2017 12:42    Procedures Procedures (including  critical care time)  Medications Ordered in ED Medications  sodium chloride 0.9 % bolus 1,000 mL (0 mLs Intravenous Stopped 01/25/17 1437)     Initial Impression / Assessment and Plan / ED Course  I have reviewed the triage vital signs and the nursing notes.  Pertinent labs & imaging results that were available during my care of the patient were reviewed by me and considered in my medical decision making (see chart for details).  On repeat exam the patient is in similar condition, appear slightly more awake. I reviewed all findings the patient's family members, including no evidence for stroke, no evidence for pneumonia, reassuring labs, sooner vitals. They reiterate that they would like to proceed to a different nursing facility. Staff at that facility has evaluated the patient here, today, will except the patient tomorrow. Patient will return to original facility tonight contrast without the facility tomorrow. With no indication for hospital admission, and with some suspicion for progressive dementia, progressive multiple myeloma, but no evidence for acute new changes, the patient is appropriate for discharge.    Final Clinical Impressions(s) / ED Diagnoses   Final diagnoses:  Weakness     Carmin Muskrat, MD 01/25/17 1627

## 2017-01-25 NOTE — ED Notes (Signed)
Per family- [facility] personnel went to [facility] to evaluate patient for potential transfer from [facility] to [place] today.   [facility] currently in route to ED to evaluate patient.   RN consulted Social Worker Marcene Brawn) who states that it will be a facility to facility transfer if [facility] approves patient and accepts today with all paperwork being done by [facility] and [facility]. If patient is not accepted by [facility], patient would have to be discharged back to the [facility].

## 2017-01-25 NOTE — ED Triage Notes (Signed)
Pt sent to ED from [facility] per family request for failure to thrive. Per EMS family reports decreased appetite and change in mentality since 01/18/17.

## 2017-01-25 NOTE — ED Notes (Signed)
Report called to [facility] at [facility] of [place]. EMS called for d/c transportation.

## 2017-01-25 NOTE — ED Notes (Signed)
Family spoke with [facility] staff and pt will be d/c'd back to [facility] today to be transferred to [place] tomorrow.

## 2017-01-25 NOTE — ED Notes (Signed)
RN spoke with Irven Shelling at [facility] who requests family call her at [phone].

## 2017-01-25 NOTE — ED Notes (Signed)
Called [facility] for transport back to [facility] in [place].

## 2017-01-25 NOTE — ED Notes (Signed)
Per family-[facility] staff states they would call with potential acceptance information.

## 2017-01-25 NOTE — Discharge Instructions (Signed)
As discussed, your evaluation today has been largely reassuring.  But, it is important that you monitor your condition carefully, and do not hesitate to return to the ED if you develop new, or concerning changes in your condition. ? ?Otherwise, please follow-up with your physician for appropriate ongoing care. ? ?

## 2017-01-30 ENCOUNTER — Other Ambulatory Visit (HOSPITAL_COMMUNITY): Payer: Self-pay | Admitting: Oncology

## 2017-01-30 DIAGNOSIS — C9000 Multiple myeloma not having achieved remission: Secondary | ICD-10-CM

## 2017-01-30 MED ORDER — LENALIDOMIDE 10 MG PO CAPS: ORAL_CAPSULE | ORAL | 0 refills | Status: DC

## 2017-02-16 ENCOUNTER — Other Ambulatory Visit (HOSPITAL_COMMUNITY): Payer: Self-pay | Admitting: Adult Health

## 2017-02-16 DIAGNOSIS — C9000 Multiple myeloma not having achieved remission: Secondary | ICD-10-CM

## 2017-02-16 MED ORDER — LENALIDOMIDE 10 MG PO CAPS: ORAL_CAPSULE | ORAL | 0 refills | Status: DC

## 2017-02-16 NOTE — Progress Notes (Signed)
Received notification from pharmacy about needing refill for Revlimid.   Prescription printed and given to Angie. No refills, as we may need to stop treatment based on last visit/note from [place], [place].    Mike Craze, NP [place] [phone]

## 2017-02-27 ENCOUNTER — Other Ambulatory Visit (HOSPITAL_COMMUNITY): Payer: Self-pay | Admitting: *Deleted

## 2017-02-27 ENCOUNTER — Ambulatory Visit (HOSPITAL_COMMUNITY): Payer: Self-pay | Admitting: Adult Health

## 2017-02-27 ENCOUNTER — Other Ambulatory Visit (HOSPITAL_COMMUNITY): Payer: Self-pay

## 2017-02-27 ENCOUNTER — Ambulatory Visit (HOSPITAL_COMMUNITY): Payer: Self-pay

## 2017-02-27 DIAGNOSIS — C9001 Multiple myeloma in remission: Secondary | ICD-10-CM

## 2017-02-28 ENCOUNTER — Encounter (HOSPITAL_COMMUNITY): Payer: Medicare Other

## 2017-02-28 ENCOUNTER — Encounter (HOSPITAL_COMMUNITY): Payer: Medicare Other | Attending: Oncology | Admitting: Adult Health

## 2017-02-28 ENCOUNTER — Encounter (HOSPITAL_COMMUNITY): Payer: Self-pay | Admitting: Adult Health

## 2017-02-28 ENCOUNTER — Encounter (HOSPITAL_BASED_OUTPATIENT_CLINIC_OR_DEPARTMENT_OTHER): Payer: Medicare Other

## 2017-02-28 VITALS — Ht 63.0 in

## 2017-02-28 DIAGNOSIS — E7800 Pure hypercholesterolemia, unspecified: Secondary | ICD-10-CM | POA: Insufficient documentation

## 2017-02-28 DIAGNOSIS — Z833 Family history of diabetes mellitus: Secondary | ICD-10-CM | POA: Insufficient documentation

## 2017-02-28 DIAGNOSIS — M8580 Other specified disorders of bone density and structure, unspecified site: Secondary | ICD-10-CM | POA: Insufficient documentation

## 2017-02-28 DIAGNOSIS — C9001 Multiple myeloma in remission: Secondary | ICD-10-CM

## 2017-02-28 DIAGNOSIS — F329 Major depressive disorder, single episode, unspecified: Secondary | ICD-10-CM | POA: Diagnosis not present

## 2017-02-28 DIAGNOSIS — F0390 Unspecified dementia without behavioral disturbance: Secondary | ICD-10-CM | POA: Insufficient documentation

## 2017-02-28 DIAGNOSIS — F259 Schizoaffective disorder, unspecified: Secondary | ICD-10-CM | POA: Diagnosis not present

## 2017-02-28 DIAGNOSIS — R296 Repeated falls: Secondary | ICD-10-CM | POA: Insufficient documentation

## 2017-02-28 DIAGNOSIS — I10 Essential (primary) hypertension: Secondary | ICD-10-CM | POA: Insufficient documentation

## 2017-02-28 DIAGNOSIS — Z9221 Personal history of antineoplastic chemotherapy: Secondary | ICD-10-CM | POA: Insufficient documentation

## 2017-02-28 DIAGNOSIS — Z9889 Other specified postprocedural states: Secondary | ICD-10-CM | POA: Diagnosis not present

## 2017-02-28 DIAGNOSIS — D649 Anemia, unspecified: Secondary | ICD-10-CM | POA: Insufficient documentation

## 2017-02-28 DIAGNOSIS — R634 Abnormal weight loss: Secondary | ICD-10-CM | POA: Insufficient documentation

## 2017-02-28 LAB — CBC WITH DIFFERENTIAL/PLATELET
BASOS PCT: 1 %
Basophils Absolute: 0 10*3/uL (ref 0.0–0.1)
EOS ABS: 0.2 10*3/uL (ref 0.0–0.7)
Eosinophils Relative: 5 %
HCT: 35.1 % — ABNORMAL LOW (ref 36.0–46.0)
Hemoglobin: 12.3 g/dL (ref 12.0–15.0)
Lymphocytes Relative: 23 %
Lymphs Abs: 0.8 10*3/uL (ref 0.7–4.0)
MCH: 33.8 pg (ref 26.0–34.0)
MCHC: 35 g/dL (ref 30.0–36.0)
MCV: 96.4 fL (ref 78.0–100.0)
Monocytes Absolute: 0.4 10*3/uL (ref 0.1–1.0)
Monocytes Relative: 12 %
NEUTROS PCT: 59 %
Neutro Abs: 2.2 10*3/uL (ref 1.7–7.7)
Platelets: 123 10*3/uL — ABNORMAL LOW (ref 150–400)
RBC: 3.64 MIL/uL — AB (ref 3.87–5.11)
RDW: 14.4 % (ref 11.5–15.5)
WBC: 3.6 10*3/uL — AB (ref 4.0–10.5)

## 2017-02-28 LAB — COMPREHENSIVE METABOLIC PANEL
ALBUMIN: 3.5 g/dL (ref 3.5–5.0)
ALK PHOS: 59 U/L (ref 38–126)
ALT: 24 U/L (ref 14–54)
AST: 17 U/L (ref 15–41)
Anion gap: 5 (ref 5–15)
BUN: 17 mg/dL (ref 6–20)
CO2: 30 mmol/L (ref 22–32)
Calcium: 8.9 mg/dL (ref 8.9–10.3)
Chloride: 105 mmol/L (ref 101–111)
Creatinine, Ser: 0.8 mg/dL (ref 0.44–1.00)
Glucose, Bld: 125 mg/dL — ABNORMAL HIGH (ref 65–99)
POTASSIUM: 3.6 mmol/L (ref 3.5–5.1)
SODIUM: 140 mmol/L (ref 135–145)
Total Bilirubin: 0.5 mg/dL (ref 0.3–1.2)
Total Protein: 6.1 g/dL — ABNORMAL LOW (ref 6.5–8.1)

## 2017-02-28 MED ORDER — ZOLEDRONIC ACID 4 MG/5ML IV CONC
3.0000 mg | Freq: Once | INTRAVENOUS | Status: AC
  Administered 2017-02-28: 3 mg via INTRAVENOUS
  Filled 2017-02-28: qty 3.75

## 2017-02-28 MED ORDER — SODIUM CHLORIDE 0.9 % IV SOLN
Freq: Once | INTRAVENOUS | Status: AC
  Administered 2017-02-28: 12:00:00 via INTRAVENOUS

## 2017-02-28 NOTE — Progress Notes (Signed)
[place], [place] [address]   CLINIC:  Medical Oncology/Hematology  [name] [address] [place] [address] [phone]   REASON FOR VISIT:  Follow-up for IgG multiple myeloma  CURRENT THERAPY: Revlimid on days 1-21 every 28 days with 40 mg Decadron weekly AND Zometa every 2 months AND Coumadin 1 mg daily (d/t increased risk of VTE with Revlimid therapy)   BRIEF ONCOLOGIC HISTORY:    Multiple myeloma ([place])   02/08/2015 Bone Marrow Biopsy    The bone marrow exhibits a marked increase in plasma cells (58% by aspirate), consistent with plasma cell myeloma.      03/05/2015 -  Chemotherapy    Revlimid 10 mg days 1-21 every 28 days and 40 mg of Dexamethasone weekly.      03/25/2015 Imaging    Bone survey- Generalized osteopenia with slightly heterogeneous lucency in the shafts of the humeri and femora. No discrete, sizable lytic lesions identified.      03/31/2015 -  Chemotherapy    Zometa 3 mg IV every 2 months      09/05/2016 Imaging    Skeletal survey- Diffuse osteopenia without discrete, new, or increasing lucent/lytic lesions. Previously noted lucencies in the shafts of the proximal femurs is decreased.        INTERVAL HISTORY:  [name] 83 y.o. female presents for continued follow-up for multiple myeloma.   Chart reviewed; she had ED visit on 01/25/17 for failure to thrive/weakness. It was noted by ED physician that they suspect progressive dementia and progressive multiple myeloma, but without evidence of new acute changes and so she was discharged to skilled nursing facility.    She is here today with her daughter and granddaughter. [name] now lives at [facility] and her family is much happier with her care there. She has only had 1 fall there, when she fell out of the wheelchair. At her previous facility, she was having very frequent falls. Since she has transferred to new  nursing facility, the family has noted that she is eating more and seems to have a bit more strength.  She works with physical therapist at the facility as well.   Of note, [name] does not communicate very much with me during this visit. Most of the information attained came from her family, as she has progressive dementia. Family notes that she continues to have "some good days and some bad days."  She is incontinent of stool and urine; she is unable to stand for a weight today. It is very difficult for her to ambulate, as she tries to cross her feet when walking.  MAR from SNF reviewed and she remains on Revlimid and Decadron as prescribed; today is day 18 of current cycle.    [name] is able to tell me that she has no pain.      REVIEW OF SYSTEMS:  Review of Systems  Unable to perform ROS: Dementia  Family states that she is eating better; endorses that she bleeds/bruises easily. Otherwise, no other reported symptoms appreciated from family's point of view.    PAST MEDICAL/SURGICAL HISTORY:  Past Medical History:  Diagnosis Date  . Arthritis   . Chronic leg pain   . Dementia   . Dysphagia, oropharyngeal phase   . Elevated total protein 11/10/2014  . High cholesterol   . Hypertension   . Major depressive disorder   . Multiple myeloma ([place]) 02/17/2015  .  Normocytic anemia 11/10/2014  . Osteopenia 03/31/2015   On Bone scan on 03/25/2015  . Poor historian   . Psychosis   . Schizoaffective disorder   . Thrombocytopenia ([place]) 11/10/2014   Past Surgical History:  Procedure Laterality Date  . COLONOSCOPY  2010   RMR: 1. Normal rectum 2. Sigmoid and cecal polyp, status post snare resection described above. Remainder of the colonic mucosa appeared normal.   . FOOT SURGERY    . TUBAL LIGATION       SOCIAL HISTORY:  Social History   Social History  . Marital status: Widowed    Spouse name: N/A  . Number of children: N/A  . Years of education: N/A   Occupational History    . Not on file.   Social History Main Topics  . Smoking status: Never Smoker  . Smokeless tobacco: Never Used  . Alcohol use No  . Drug use: No  . Sexual activity: Not Currently    Birth control/ protection: Post-menopausal   Other Topics Concern  . Not on file   Social History Narrative  . No narrative on file    FAMILY HISTORY:  Family History  Problem Relation Age of Onset  . Diabetes Mother     CURRENT MEDICATIONS:  Outpatient Encounter Prescriptions as of 02/28/2017  Medication Sig Note  . acetaminophen (TYLENOL) 325 MG tablet Take 650 mg by mouth every 4 (four) hours as needed for fever.   . calcium carbonate (TUMS - DOSED IN MG ELEMENTAL CALCIUM) 500 MG chewable tablet Chew 2 tablets by mouth daily.   . calcium citrate-vitamin D (CITRACAL+D) 315-200 MG-UNIT tablet Take 1 tablet by mouth 2 (two) times daily.   . cholecalciferol (VITAMIN D) 1000 units tablet Take 500 Units by mouth daily.   . clonazePAM (KLONOPIN) 0.5 MG tablet Take 0.5 mg by mouth 2 (two) times daily as needed for anxiety.   . enoxaparin (LOVENOX) 40 MG/0.4ML injection    . fentaNYL (DURAGESIC - DOSED MCG/HR) 12 MCG/HR Place 1 patch (12.5 mcg total) onto the skin every 3 (three) days. (Patient taking differently: Place 12 mcg onto the skin every 3 (three) days. ) 09/05/2016: Applied 09/05/2016  . ferrous sulfate 325 (65 FE) MG tablet Take 1 tablet (325 mg total) by mouth 3 (three) times daily with meals. (Patient taking differently: Take 325 mg by mouth 4 (four) times daily. )   . HYDROcodone-acetaminophen (NORCO/VICODIN) 5-325 MG per tablet Take 1 tablet by mouth at bedtime as needed (pain). (Patient taking differently: Take 1 tablet by mouth every 12 (twelve) hours as needed (pain). )   . lenalidomide (REVLIMID) 10 MG capsule TAKE 1 CAPSULE BY MOUTH DAILY FOR 21 DAYS ON FOLLOWED BY 7 DAYS OFF.   . Melatonin 3 MG CAPS Take 1 mg by mouth at bedtime.    . Multiple Vitamins-Minerals (MULTIVITAMIN WITH  MINERALS) tablet Take 1 tablet by mouth daily.   . polyethylene glycol (MIRALAX / GLYCOLAX) packet Take 17 g by mouth at bedtime.    . potassium chloride SA (K-DUR,KLOR-CON) 20 MEQ tablet Take 1 tablet (20 mEq total) by mouth daily.   Marland Kitchen senna (SENOKOT) 8.6 MG TABS tablet Take 1 tablet by mouth 2 (two) times daily.   . sertraline (ZOLOFT) 100 MG tablet Take 100 mg by mouth daily.   Marland Kitchen trimethoprim-polymyxin b (POLYTRIM) ophthalmic solution Place 1 drop into both eyes every 3 (three) hours. For red eyes while awake   . warfarin (COUMADIN) 1 MG tablet Take 1  tablet (1 mg total) by mouth daily.   . Wheat Dextrin (BENEFIBER DRINK MIX) PACK Take by mouth. Take 2 teaspoons by mouth everyday.    No facility-administered encounter medications on file as of 02/28/2017.     ALLERGIES:  Allergies  Allergen Reactions  . Pollen Extract     Teary eyes, runny nose.     PHYSICAL EXAM:  ECOG Performance status: 3 - Symptomatic; requires significant assistance; lives in skilled nursing facility.  BP-145/65 HR-66 Resp-16 O2 sat-100%   Unable to get weight as patient unable to stand without significant assistance.   Physical Exam  Constitutional: No distress.  Thin elderly female in no distress.   HENT:  Head: Normocephalic.  Mouth/Throat: Oropharynx is clear and moist. No oropharyngeal exudate.  Eyes: Conjunctivae are normal. Pupils are equal, round, and reactive to light. No scleral icterus.  Neck: Normal range of motion. Neck supple.  Cardiovascular: Normal rate, regular rhythm and normal heart sounds.   Pulmonary/Chest: Effort normal and breath sounds normal.  Patient unable to follow directions to take deep breaths.   Abdominal: Soft. Bowel sounds are normal. There is no tenderness. There is no rebound and no guarding.  Musculoskeletal: She exhibits no edema.  Lymphadenopathy:    She has no cervical adenopathy.  Neurological: She is alert.  Skin: Skin is warm and dry. No rash noted.    Psychiatric:  Dementia appreciated; Pleasant; does seem to recognize her family members. Difficult for her to follow direct instructions.   Nursing note and vitals reviewed.    LABORATORY DATA:  I have reviewed the labs as listed.  CBC    Component Value Date/Time   WBC 3.6 (L) 02/28/2017 1000   RBC 3.64 (L) 02/28/2017 1000   HGB 12.3 02/28/2017 1000   HCT 35.1 (L) 02/28/2017 1000   PLT 123 (L) 02/28/2017 1000   MCV 96.4 02/28/2017 1000   MCH 33.8 02/28/2017 1000   MCHC 35.0 02/28/2017 1000   RDW 14.4 02/28/2017 1000   LYMPHSABS 0.8 02/28/2017 1000   MONOABS 0.4 02/28/2017 1000   EOSABS 0.2 02/28/2017 1000   BASOSABS 0.0 02/28/2017 1000   CMP Latest Ref Rng & Units 02/28/2017 01/25/2017 01/02/2017  Glucose 65 - 99 mg/dL 125(H) 219(H) 65  BUN 6 - 20 mg/dL 17 22(H) 18  Creatinine 0.44 - 1.00 mg/dL 0.80 0.82 0.87  Sodium 135 - 145 mmol/L 140 144 139  Potassium 3.5 - 5.1 mmol/L 3.6 3.8 4.0  Chloride 101 - 111 mmol/L 105 109 104  CO2 22 - 32 mmol/L '30 27 30  '$ Calcium 8.9 - 10.3 mg/dL 8.9 9.1 9.2  Total Protein 6.5 - 8.1 g/dL 6.1(L) 6.5 6.5  Total Bilirubin 0.3 - 1.2 mg/dL 0.5 0.5 0.6  Alkaline Phos 38 - 126 U/L 59 81 45  AST 15 - 41 U/L '17 21 20  '$ ALT 14 - 54 U/L 24 34 21   Results for KABAO, LEITE (MRN [id number])  Ref. Range 01/02/2017 09:51 01/02/2017 09:51 01/02/2017 09:52  Total Protein ELP Latest Ref Range: 6.0 - 8.5 g/dL  6.4 6.2  Albumin ELP Latest Ref Range: 2.9 - 4.4 g/dL   2.6 (L)  Globulin, Total Latest Ref Range: 2.2 - 3.9    3.6  A/G Ratio Latest Ref Range: 0.7 - 1.7    0.7  Alpha-1-Globulin Latest Ref Range: 0.0 - 0.4 g/dL   0.2  Alpha-2-Globulin Latest Ref Range: 0.4 - 1.0 g/dL   0.6  Beta Globulin Latest Ref Range: 0.7 -  1.3 g/dL   0.8  Gamma Globulin Latest Ref Range: 0.4 - 1.8 g/dL   2.0 (H)  M-SPIKE, % Latest Ref Range: Not Observed g/dL   1.8 (H)  SPE Interp. Unknown   Comment  Comment Unknown   Comment  IgG (Immunoglobin G), Serum Latest Ref Range:  700 - 1,600 mg/dL 913 940   IgA Latest Ref Range: 64 - 422 mg/dL 149 158   IgM, Serum Latest Ref Range: 26 - 217 mg/dL 20 (L) 22 (L)   Kappa free light chain Latest Ref Range: 3.3 - 19.4 mg/L 32.1 (H)    Lamda free light chains Latest Ref Range: 5.7 - 26.3 mg/L 18.0    Kappa, lamda light chain ratio Latest Ref Range: 0.26 - 1.65  1.78 (H)      PENDING LABS:    DIAGNOSTIC IMAGING:  CT head (done in ED): 01/25/17     PATHOLOGY:  Bone marrow biopsy: 02/08/15      ASSESSMENT & PLAN:   IgG multiple myeloma:  -Diagnosed in 01/2015 with plasma cell neoplasm with 58% plasma cells on bone marrow biopsy.  -Revlimid/Decadron started in 02/2015; initial M spike 8.68. She showed response to treatment with decreasing M spike until 12/2016 when M spike increased to 1.8.  Kappa/Lambda light chain ratio and kappa free light chain increased as well. -I had lengthy discussion today with patient's family today regarding goals of care for [name]. Discussed concerns that her myeloma labs are beginning to show likely progression of disease. If her myeloma labs are stable, then we could consider continuing current treatment for now. However, we discussed that given her performance status and dementia, she would not be a candidate for more aggressive therapy for the multiple myeloma.  They are able to verbalize the changes they have seen in [name] in the past several months in terms of her progressing dementia and weakness.  We discussed that there often comes a time when continuing chemotherapy can do more harm than good; they understand that we may be getting close to making the decision to stop treatment.  We agreed today that if her myeloma labs which were collected today showed continued progression (with further increasing M spike and increased light chains/ratio), then we would stop the Revlimid/Decadron and every other month Zometa. We could also stop follow-up appointments at that time as well. We  would certainly be "on standby" for the patient/family if they have questions.  They understand that we will be in touch with them once her myeloma lab studies have resulted; we will then communicate any treatment changes, if appropriate, to the [facility] facility.  Patient's daughter and granddaughter agree with this plan.    Bone health:  -Continue every other month Zometa. She is due today and will receive dose as scheduled; calcium is normal.  -Discussed with family that if we stop Revlimid/Decadron, then we would recommend stopping the Zometa as well, as it may not provide much clinical benefit for her after stopping chemotherapy.  They agreed with this plan.   -Will make tentative arrangements for next infusion in 2 months; can certainly cancel appointment if we decide to stop treatment.      Dispo:  -Pending based on today's myeloma labs. Tentative follow-up visit for 2 months with next Zometa infusion scheduled today; we can cancel if we decide to stop treatment based on lab work.    All questions were answered to patient's stated satisfaction. Encouraged patient to call with any new  concerns or questions before her next visit to the cancer center and we can certain see her sooner, if needed.    A total of 30 minutes was spent in face-to-face care of this patient with greater than 50% of that time spent in counseling and care-coordination.   Plan of care discussed with Dr. Talbert Cage, who agrees with the above aforementioned.     Orders placed this encounter:  No orders of the defined types were placed in this encounter.     Mike Craze, NP [place] [phone]

## 2017-02-28 NOTE — Patient Instructions (Addendum)
[name] at [facility] Discharge Instructions  RECOMMENDATIONS MADE BY THE CONSULTANT AND ANY TEST RESULTS WILL BE SENT TO YOUR REFERRING PHYSICIAN.  You were seen today by Mike Craze NP. Return in 2 months for follow up, labs, zometa.    Thank you for choosing [place] at [facility] to provide your oncology and hematology care.  To afford each patient quality time with our provider, please arrive at least 15 minutes before your scheduled appointment time.    If you have a lab appointment with the [facility] please come in thru the  Main Entrance and check in at the main information desk  You need to re-schedule your appointment should you arrive 10 or more minutes late.  We strive to give you quality time with our providers, and arriving late affects you and other patients whose appointments are after yours.  Also, if you no show three or more times for appointments you may be dismissed from the clinic at the providers discretion.     Again, thank you for choosing [facility].  Our hope is that these requests will decrease the amount of time that you wait before being seen by our physicians.       _____________________________________________________________  Should you have questions after your visit to [facility], please contact our office at [phone] between the hours of 8:30 a.m. and 4:30 p.m.  Voicemails left after 4:30 p.m. will not be returned until the following business day.  For prescription refill requests, have your pharmacy contact our office.       Resources For Cancer Patients and their Caregivers ? American Cancer Society: Can assist with transportation, wigs, general needs, runs Look Good Feel Better.        9802606057 ? Cancer Care: Provides financial assistance, online support groups, medication/co-pay assistance.  1-800-813-HOPE (256)081-2553) ? [place] Assists [place] cancer patients and their families through emotional , educational and financial support.  226-806-6840 ? [place] DSS Where to apply for food stamps, Medicaid and utility assistance. 267-710-3382 ? [employer]: Transportation to medical appointments. 3805897415 ? Social Security Administration: May apply for disability if have a Stage IV cancer. 403-038-4038 (754)108-4458 ? [employer], Disability and Transit Services: Assists with nutrition, care and transit needs. [place] Support Programs: @10RELATIVEDAYS @ > Cancer Support Group  2nd Tuesday of the month 1pm-2pm, Journey Room  > Creative Journey  3rd Tuesday of the month 1130am-1pm, Journey Room  > Look Good Feel Better  1st Wednesday of the month 10am-12 noon, Journey Room (Call [place] to register [phone])

## 2017-02-28 NOTE — Progress Notes (Signed)
Tolerated infusion w/o adverse reaction.  Alert, in no distress.  Discharged via wheelchair in c/o family.  

## 2017-03-01 LAB — PROTEIN ELECTROPHORESIS, SERUM
A/G Ratio: 1.3 (ref 0.7–1.7)
Albumin ELP: 3.2 g/dL (ref 2.9–4.4)
Alpha-1-Globulin: 0.2 g/dL (ref 0.0–0.4)
Alpha-2-Globulin: 0.6 g/dL (ref 0.4–1.0)
BETA GLOBULIN: 0.7 g/dL (ref 0.7–1.3)
Gamma Globulin: 0.9 g/dL (ref 0.4–1.8)
Globulin, Total: 2.5 g/dL (ref 2.2–3.9)
M-Spike, %: 0.6 g/dL — ABNORMAL HIGH
TOTAL PROTEIN ELP: 5.7 g/dL — AB (ref 6.0–8.5)

## 2017-03-01 LAB — IGG, IGA, IGM
IGG (IMMUNOGLOBIN G), SERUM: 915 mg/dL (ref 700–1600)
IGM, SERUM: 24 mg/dL — AB (ref 26–217)
IgA: 133 mg/dL (ref 64–422)

## 2017-03-01 LAB — BETA 2 MICROGLOBULIN, SERUM: BETA 2 MICROGLOBULIN: 3.1 mg/L — AB (ref 0.6–2.4)

## 2017-03-01 LAB — KAPPA/LAMBDA LIGHT CHAINS
KAPPA, LAMDA LIGHT CHAIN RATIO: 1.57 (ref 0.26–1.65)
Kappa free light chain: 23.7 mg/L — ABNORMAL HIGH (ref 3.3–19.4)
Lambda free light chains: 15.1 mg/L (ref 5.7–26.3)

## 2017-03-05 LAB — IMMUNOFIXATION ELECTROPHORESIS
IgA: 133 mg/dL (ref 64–422)
IgG (Immunoglobin G), Serum: 891 mg/dL (ref 700–1600)
IgM, Serum: 25 mg/dL — ABNORMAL LOW (ref 26–217)
TOTAL PROTEIN ELP: 5.7 g/dL — AB (ref 6.0–8.5)

## 2017-03-09 ENCOUNTER — Telehealth (HOSPITAL_COMMUNITY): Payer: Self-pay

## 2017-03-09 ENCOUNTER — Ambulatory Visit (HOSPITAL_COMMUNITY)
Admission: RE | Admit: 2017-03-09 | Discharge: 2017-03-09 | Disposition: A | Discharge status: Home / Self Care | Payer: Medicare Other | Source: Ambulatory Visit | Attending: Hospice and Palliative Medicine | Admitting: Hospice and Palliative Medicine

## 2017-03-09 ENCOUNTER — Other Ambulatory Visit (HOSPITAL_COMMUNITY): Payer: Self-pay | Admitting: Hospice and Palliative Medicine

## 2017-03-09 DIAGNOSIS — C9000 Multiple myeloma not having achieved remission: Secondary | ICD-10-CM | POA: Diagnosis not present

## 2017-03-09 DIAGNOSIS — R4182 Altered mental status, unspecified: Secondary | ICD-10-CM | POA: Diagnosis present

## 2017-03-09 DIAGNOSIS — I739 Peripheral vascular disease, unspecified: Secondary | ICD-10-CM | POA: Diagnosis not present

## 2017-03-09 NOTE — Telephone Encounter (Signed)
Patient is a resident of [facility]. April, one of the nursing supervisors, called wanting to know if the revlimid was supposed to be started back.   According to the last note from Mike Craze, NP:  " I had lengthy discussion today with patient's family today regarding goals of care for [name]. Discussed concerns that her myeloma labs are beginning to show likely progression of disease. If her myeloma labs are stable, then we could consider continuing current treatment for now. However, we discussed that given her performance status and dementia, she would not be a candidate for more aggressive therapy for the multiple myeloma.  They are able to verbalize the changes they have seen in [name] in the past several months in terms of her progressing dementia and weakness.  We discussed that there often comes a time when continuing chemotherapy can do more harm than good; they understand that we may be getting close to making the decision to stop treatment.  We agreed today that if her myeloma labs which were collected today showed continued progression (with further increasing M spike and increased light chains/ratio), then we would stop the Revlimid/Decadron and every other month Zometa. We could also stop follow-up appointments at that time as well."  Reviewed last note and labs with Dr. Talbert Cage. Patients myeloma labs were better. Dr. Talbert Cage said to continue the Revlimid. Notified April at [facility] of Dr. Laverle Patter decision. She verbalized understanding and stated patient is due to start Revlimid back today. April verbalized understanding.

## 2017-03-26 ENCOUNTER — Other Ambulatory Visit (HOSPITAL_COMMUNITY): Payer: Self-pay | Admitting: Oncology

## 2017-03-26 DIAGNOSIS — C9000 Multiple myeloma not having achieved remission: Secondary | ICD-10-CM

## 2017-03-26 MED ORDER — LENALIDOMIDE 10 MG PO CAPS: ORAL_CAPSULE | ORAL | 0 refills | Status: DC

## 2017-03-27 ENCOUNTER — Other Ambulatory Visit (HOSPITAL_COMMUNITY): Payer: Self-pay | Admitting: Oncology

## 2017-03-27 ENCOUNTER — Telehealth (HOSPITAL_COMMUNITY): Payer: Self-pay | Admitting: Emergency Medicine

## 2017-03-27 ENCOUNTER — Telehealth (HOSPITAL_COMMUNITY): Payer: Self-pay | Admitting: *Deleted

## 2017-03-27 NOTE — Telephone Encounter (Signed)
Daughter called and stated that the doctor at [facility] wants to stop the revlimid bc her demitia is worse and the cancer is spreading in her head.  I looked at the imaging of her head and read the report to the doctor where it does verify the cancer is worse in her brain.  I spoke with Mike Craze NP and she has reviewed the information.  She wrote orders to D/C revlimid and zometa infusions and also any follow up here at the cancer clinic.  Orders faxed to [facility] and confirmed.

## 2017-04-26 ENCOUNTER — Other Ambulatory Visit (HOSPITAL_COMMUNITY): Payer: Self-pay | Admitting: Oncology

## 2017-04-30 ENCOUNTER — Ambulatory Visit (HOSPITAL_COMMUNITY): Payer: Self-pay

## 2017-04-30 ENCOUNTER — Other Ambulatory Visit (HOSPITAL_COMMUNITY): Payer: Self-pay

## 2017-05-13 DEATH — deceased

## 2021-09-13 ENCOUNTER — Other Ambulatory Visit: Payer: Self-pay | Admitting: Nurse Practitioner
